# Patient Record
Sex: Female | Born: 1975 | Race: White | Hispanic: No | Marital: Single | Smoking: Never smoker
Health system: Southern US, Community
[De-identification: ages and names within clinical notes are randomized; demographics above are authoritative.]

## PROBLEM LIST (undated history)

## (undated) DIAGNOSIS — G43909 Migraine, unspecified, not intractable, without status migrainosus: Secondary | ICD-10-CM

## (undated) HISTORY — DX: Migraine, unspecified, not intractable, without status migrainosus: G43.909

## (undated) HISTORY — PX: INNER EAR SURGERY: SHX679

## (undated) HISTORY — PX: AUGMENTATION MAMMAPLASTY: SUR837

---
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
## 2003-07-06 HISTORY — PX: BREAST ENHANCEMENT SURGERY: SHX7

## 2009-02-14 ENCOUNTER — Emergency Department (HOSPITAL_COMMUNITY): Admission: EM | Admit: 2009-02-14 | Discharge: 2009-02-14 | Payer: Self-pay | Admitting: Family Medicine

## 2015-11-21 DIAGNOSIS — Z01419 Encounter for gynecological examination (general) (routine) without abnormal findings: Secondary | ICD-10-CM | POA: Diagnosis not present

## 2015-11-21 DIAGNOSIS — Z6823 Body mass index (BMI) 23.0-23.9, adult: Secondary | ICD-10-CM | POA: Diagnosis not present

## 2015-12-08 DIAGNOSIS — H5213 Myopia, bilateral: Secondary | ICD-10-CM | POA: Diagnosis not present

## 2016-02-09 DIAGNOSIS — G43009 Migraine without aura, not intractable, without status migrainosus: Secondary | ICD-10-CM | POA: Diagnosis not present

## 2016-02-11 DIAGNOSIS — R300 Dysuria: Secondary | ICD-10-CM | POA: Diagnosis not present

## 2016-02-11 DIAGNOSIS — N390 Urinary tract infection, site not specified: Secondary | ICD-10-CM | POA: Diagnosis not present

## 2016-02-27 DIAGNOSIS — Z3202 Encounter for pregnancy test, result negative: Secondary | ICD-10-CM | POA: Diagnosis not present

## 2016-02-27 DIAGNOSIS — Z30430 Encounter for insertion of intrauterine contraceptive device: Secondary | ICD-10-CM | POA: Diagnosis not present

## 2016-02-27 DIAGNOSIS — Z113 Encounter for screening for infections with a predominantly sexual mode of transmission: Secondary | ICD-10-CM | POA: Diagnosis not present

## 2016-03-10 DIAGNOSIS — H722X2 Other marginal perforations of tympanic membrane, left ear: Secondary | ICD-10-CM | POA: Diagnosis not present

## 2016-04-09 DIAGNOSIS — Z30431 Encounter for routine checking of intrauterine contraceptive device: Secondary | ICD-10-CM | POA: Diagnosis not present

## 2016-09-06 DIAGNOSIS — N938 Other specified abnormal uterine and vaginal bleeding: Secondary | ICD-10-CM | POA: Diagnosis not present

## 2016-09-06 DIAGNOSIS — Z30431 Encounter for routine checking of intrauterine contraceptive device: Secondary | ICD-10-CM | POA: Diagnosis not present

## 2016-10-08 DIAGNOSIS — M25552 Pain in left hip: Secondary | ICD-10-CM | POA: Diagnosis not present

## 2016-10-08 DIAGNOSIS — M9902 Segmental and somatic dysfunction of thoracic region: Secondary | ICD-10-CM | POA: Diagnosis not present

## 2016-10-08 DIAGNOSIS — M9904 Segmental and somatic dysfunction of sacral region: Secondary | ICD-10-CM | POA: Diagnosis not present

## 2016-10-08 DIAGNOSIS — M546 Pain in thoracic spine: Secondary | ICD-10-CM | POA: Diagnosis not present

## 2016-10-08 DIAGNOSIS — M9903 Segmental and somatic dysfunction of lumbar region: Secondary | ICD-10-CM | POA: Diagnosis not present

## 2016-10-08 DIAGNOSIS — M545 Low back pain: Secondary | ICD-10-CM | POA: Diagnosis not present

## 2016-10-08 DIAGNOSIS — M9905 Segmental and somatic dysfunction of pelvic region: Secondary | ICD-10-CM | POA: Diagnosis not present

## 2016-10-08 DIAGNOSIS — M25551 Pain in right hip: Secondary | ICD-10-CM | POA: Diagnosis not present

## 2016-12-14 DIAGNOSIS — H52223 Regular astigmatism, bilateral: Secondary | ICD-10-CM | POA: Diagnosis not present

## 2016-12-14 DIAGNOSIS — H5213 Myopia, bilateral: Secondary | ICD-10-CM | POA: Diagnosis not present

## 2017-04-08 DIAGNOSIS — Z01419 Encounter for gynecological examination (general) (routine) without abnormal findings: Secondary | ICD-10-CM | POA: Diagnosis not present

## 2017-04-08 DIAGNOSIS — Z6823 Body mass index (BMI) 23.0-23.9, adult: Secondary | ICD-10-CM | POA: Diagnosis not present

## 2017-04-25 DIAGNOSIS — H9072 Mixed conductive and sensorineural hearing loss, unilateral, left ear, with unrestricted hearing on the contralateral side: Secondary | ICD-10-CM | POA: Diagnosis not present

## 2017-04-25 DIAGNOSIS — H9012 Conductive hearing loss, unilateral, left ear, with unrestricted hearing on the contralateral side: Secondary | ICD-10-CM | POA: Diagnosis not present

## 2017-04-25 DIAGNOSIS — Z9889 Other specified postprocedural states: Secondary | ICD-10-CM | POA: Diagnosis not present

## 2017-05-13 DIAGNOSIS — Z30432 Encounter for removal of intrauterine contraceptive device: Secondary | ICD-10-CM | POA: Diagnosis not present

## 2017-05-13 DIAGNOSIS — Z3009 Encounter for other general counseling and advice on contraception: Secondary | ICD-10-CM | POA: Diagnosis not present

## 2017-05-13 DIAGNOSIS — N938 Other specified abnormal uterine and vaginal bleeding: Secondary | ICD-10-CM | POA: Diagnosis not present

## 2017-06-06 ENCOUNTER — Telehealth: Payer: Self-pay

## 2017-06-06 NOTE — Telephone Encounter (Signed)
returning call

## 2017-06-06 NOTE — Telephone Encounter (Signed)
Called patient back. No answer.

## 2017-06-06 NOTE — Telephone Encounter (Signed)
Pre- visit call made to patient. Left message for return cal.

## 2017-06-07 ENCOUNTER — Encounter: Payer: Self-pay | Admitting: Family

## 2017-06-07 ENCOUNTER — Ambulatory Visit (INDEPENDENT_AMBULATORY_CARE_PROVIDER_SITE_OTHER): Payer: 59 | Admitting: Family

## 2017-06-07 VITALS — BP 121/92 | HR 64 | Temp 98.4°F | Resp 16 | Ht 67.0 in | Wt 150.8 lb

## 2017-06-07 DIAGNOSIS — H9192 Unspecified hearing loss, left ear: Secondary | ICD-10-CM

## 2017-06-07 DIAGNOSIS — G43829 Menstrual migraine, not intractable, without status migrainosus: Secondary | ICD-10-CM

## 2017-06-07 DIAGNOSIS — G43909 Migraine, unspecified, not intractable, without status migrainosus: Secondary | ICD-10-CM | POA: Insufficient documentation

## 2017-06-07 DIAGNOSIS — H9190 Unspecified hearing loss, unspecified ear: Secondary | ICD-10-CM | POA: Insufficient documentation

## 2017-06-07 MED ORDER — NORETHIN-ETH ESTRAD-FE BIPHAS 1 MG-10 MCG / 10 MCG PO TABS: 1.0000 | ORAL_TABLET | Freq: Every day | ORAL | 11 refills | Status: DC

## 2017-06-07 MED ORDER — SUMATRIPTAN SUCCINATE 50 MG PO TABS: ORAL_TABLET | ORAL | 5 refills | Status: DC

## 2017-06-07 NOTE — Assessment & Plan Note (Signed)
Migraines are stable.  I suspect that her oral contraceptive is also going to help with her migraine symptoms.  Refill has been provided for Imitrex to use on an as-needed basis.

## 2017-06-07 NOTE — Patient Instructions (Signed)
Welcome to [facility]! 

## 2017-06-07 NOTE — Progress Notes (Signed)
Subjective:    Patient ID: Maria Hernandez, female    DOB: 08/30/1975, 41 y.o.   MRN: [id number]  HPI  Ms. Maria Hernandez is a 41 yr old female who presents today to establish care.   pmhx is significant for Migraines.  Reports that migraines began about 10 years ago.  Reports migraines once a month.  Typically occurs during/around period. Has associated nausea.  Usually does not have photophobia.  Excedrin migraine works most of the time. If this does not help imitrex helps. Recently started lo loestrin per her GYN.   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 [place]  Had repeat in 2015.  Saw ENT and  Was told that it needs to be re-done next month.    Review of Systems  Constitutional: Negative for unexpected weight change.  HENT: Negative for rhinorrhea.   Eyes: Negative for visual disturbance.  Respiratory: Negative for cough.   Cardiovascular: Negative for leg swelling.  Gastrointestinal: Negative for constipation and diarrhea.  Genitourinary: Negative for dysuria, frequency and menstrual problem.  Musculoskeletal: Negative for arthralgias and myalgias.  Hematological: Negative for adenopathy.  Psychiatric/Behavioral:       Denies depression/anxiety symptoms    Past Medical History:  Diagnosis Date  . Migraines      Social History   Socioeconomic History  . Marital status: Single    Spouse name: Not on file  . Number of children: Not on file  . Years of education: Not on file  . Highest education level: Not on file  Social Needs  . Financial resource strain: Not on file  . Food insecurity - worry: Not on file  . Food insecurity - inability: Not on file  . Transportation needs - medical: Not on file  . Transportation needs - non-medical: Not on file  Occupational History  . Not on file  Tobacco Use  . Smoking status: Never Smoker  . Smokeless tobacco: Never Used  Substance and Sexual  Activity  . Alcohol use: Yes    Frequency: Never    Comment: occasional wine "1- 2 per month"  . Drug use: Not on file  . Sexual activity: Not on file  Other Topics Concern  . Not on file  Social History Narrative  . Not on file    Past Surgical History:  Procedure Laterality Date  . BREAST ENHANCEMENT SURGERY Bilateral 2005  . INNER EAR SURGERY Left    Reports 2 prior ear surgeries for prosthesis. 3rd surgery coming up in January 2019  . MANDIBLE OSTEOTOMY  1995    Family History  Problem Relation Age of Onset  . Hypothyroidism Mother   . Hypertension Father   . Diabetes type II Father   . Breast cancer Maternal Grandmother     Allergies no known allergies  Current Outpatient Medications on File Prior to Visit  Medication Sig Dispense Refill  . ibuprofen (ADVIL,MOTRIN) 200 MG tablet Take 200 mg by mouth as needed.    . Multiple Vitamin (MULTIVITAMIN) tablet Take 1 tablet by mouth daily.    . SUMAtriptan (IMITREX) 50 MG tablet Take 1 tablet by mouth. Every 2 hours as needed for migraine     No current facility-administered medications on file prior to visit.     BP (!) 121/92 (BP Location: Right Arm, Cuff Size: Normal)   Pulse 64   Temp 98.4 F (36.9 C) (  Oral)   Resp 16   Ht 5\' 7"  (1.702 m)   Wt 150 lb 12.8 oz (68.4 kg)   LMP 06/07/2017   SpO2 100%   BMI 23.62 kg/m       Objective:   Physical Exam  Constitutional: She is oriented to person, place, and time. She appears well-developed and well-nourished.  HENT:  Head: Normocephalic and atraumatic.  Right Ear: Tympanic membrane and ear canal normal.  Left Ear: Ear canal normal.  + tm perforation.  Cardiovascular: Normal rate, regular rhythm and normal heart sounds.  No murmur heard. Pulmonary/Chest: Effort normal and breath sounds normal. No respiratory distress. She has no wheezes.  Musculoskeletal: She exhibits no edema.  Neurological: She is alert and oriented to person, place, and time.    Psychiatric: She has a normal mood and affect. Her behavior is normal. Judgment and thought content normal.          Assessment & Plan:

## 2017-06-07 NOTE — Assessment & Plan Note (Signed)
This is being managed by ENT and she has upcoming surgery planned.

## 2017-07-26 DIAGNOSIS — H9012 Conductive hearing loss, unilateral, left ear, with unrestricted hearing on the contralateral side: Secondary | ICD-10-CM | POA: Diagnosis not present

## 2017-08-02 DIAGNOSIS — H902 Conductive hearing loss, unspecified: Secondary | ICD-10-CM | POA: Diagnosis not present

## 2017-08-02 DIAGNOSIS — H7292 Unspecified perforation of tympanic membrane, left ear: Secondary | ICD-10-CM | POA: Diagnosis not present

## 2017-10-24 DIAGNOSIS — H9012 Conductive hearing loss, unilateral, left ear, with unrestricted hearing on the contralateral side: Secondary | ICD-10-CM | POA: Diagnosis not present

## 2017-11-10 DIAGNOSIS — H5212 Myopia, left eye: Secondary | ICD-10-CM | POA: Diagnosis not present

## 2017-11-10 DIAGNOSIS — H52223 Regular astigmatism, bilateral: Secondary | ICD-10-CM | POA: Diagnosis not present

## 2018-01-11 ENCOUNTER — Telehealth: Payer: Self-pay | Admitting: Family

## 2018-01-11 NOTE — Telephone Encounter (Signed)
1 month supply of imitrex sent to pharmacy. Pt is past due for cpe. Please call pt to schedule cpe with Melissa. Thanks!

## 2018-01-11 NOTE — Telephone Encounter (Signed)
Called pt and LVM advising pt to return call to schedule a CPE at her convenience.

## 2018-03-07 ENCOUNTER — Encounter: Payer: Self-pay | Admitting: Family

## 2018-03-07 ENCOUNTER — Ambulatory Visit: Payer: 59 | Admitting: Family

## 2018-03-07 VITALS — BP 122/88 | HR 70 | Temp 98.1°F | Resp 18 | Ht 67.0 in | Wt 155.8 lb

## 2018-03-07 DIAGNOSIS — G43809 Other migraine, not intractable, without status migrainosus: Secondary | ICD-10-CM | POA: Diagnosis not present

## 2018-03-07 DIAGNOSIS — N926 Irregular menstruation, unspecified: Secondary | ICD-10-CM | POA: Diagnosis not present

## 2018-03-07 DIAGNOSIS — Z1239 Encounter for other screening for malignant neoplasm of breast: Secondary | ICD-10-CM

## 2018-03-07 DIAGNOSIS — Z0000 Encounter for general adult medical examination without abnormal findings: Secondary | ICD-10-CM

## 2018-03-07 DIAGNOSIS — Z1231 Encounter for screening mammogram for malignant neoplasm of breast: Secondary | ICD-10-CM

## 2018-03-07 MED ORDER — NORGESTIMATE-ETH ESTRADIOL 0.25-35 MG-MCG PO TABS: 1.0000 | ORAL_TABLET | Freq: Every day | ORAL | 11 refills | Status: DC

## 2018-03-07 MED ORDER — SUMATRIPTAN SUCCINATE 50 MG PO TABS: ORAL_TABLET | ORAL | 5 refills | Status: DC

## 2018-03-07 NOTE — Progress Notes (Signed)
Subjective:    Patient ID: Maria Hernandez, female    DOB: [birth year], 42 y.o.   MRN: [id number]  HPI  Reports migraines are intermittent. Occur periodically. Not necessarily related to her menstrual cycle. Reports that she has been spotting regularly.  She reports that she has migraines 2-3 times a month.  Uses 1/2 tab of imitrex and this relieves her headache. Pleased with this regimen and wishes to continue.  Irregular menses-she reports that she has wall 7 to 14-day cycles with intermittent spotting despite use of oral contraceptive.  Her oral contraceptive has been adjusted several times by her gynecologist whom she continues to follow with. Review of Systems   See HPI  Past Medical History:  Diagnosis Date  . Migraines      Social History   Socioeconomic History  . Marital status: Single    Spouse name: Not on file  . Number of children: Not on file  . Years of education: Not on file  . Highest education level: Not on file  Occupational History  . Not on file  Social Needs  . Financial resource strain: Not on file  . Food insecurity:    Worry: Not on file    Inability: Not on file  . Transportation needs:    Medical: Not on file    Non-medical: Not on file  Tobacco Use  . Smoking status: Never Smoker  . Smokeless tobacco: Never Used  Substance and Sexual Activity  . Alcohol use: Yes    Frequency: Never    Comment: occasional wine "1- 2 per month"  . Drug use: Not on file  . Sexual activity: Not on file  Lifestyle  . Physical activity:    Days per week: Not on file    Minutes per session: Not on file  . Stress: Not on file  Relationships  . Social connections:    Talks on phone: Not on file    Gets together: Not on file    Attends religious service: Not on file    Active member of club or organization: Not on file    Attends meetings of clubs or organizations: Not on file    Relationship status: Not on file  . Intimate partner violence:    Fear of  current or ex partner: Not on file    Emotionally abused: Not on file    Physically abused: Not on file    Forced sexual activity: Not on file  Other Topics Concern  . 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 BREAST ENHANCEMENT SURGERY Bilateral 2005  . INNER EAR SURGERY Left    Reports 2 prior ear surgeries for prosthesis. 3rd surgery coming up in January 2019  . MANDIBLE OSTEOTOMY  1995    Family History  Problem Relation Age of Onset  . Hypothyroidism Mother   . Hypertension Father   . Diabetes type II Father   . Breast cancer Maternal Grandmother     No Known Allergies  Current Outpatient Medications on File Prior to Visit  Medication Sig Dispense Refill  . ibuprofen (ADVIL,MOTRIN) 200 MG tablet Take 200 mg by mouth as needed.    Marland Kitchen  Multiple Vitamin (MULTIVITAMIN) tablet Take 1 tablet by mouth daily.     No current facility-administered medications on file prior to visit.     BP 122/88 (BP Location: Right Arm, Cuff Size: Normal)   Pulse 70   Temp 98.1 F (36.7 C) (Oral)   Resp 18   Ht 5\' 7"  (1.702 m)   Wt 155 lb 12.8 oz (70.7 kg)   LMP 02/07/2018   SpO2 100%   BMI 24.40 kg/m       Objective:   Physical Exam  Constitutional: She is oriented to person, place, and time. She appears well-developed and well-nourished.  Eyes: Pupils are equal, round, and reactive to light. EOM and lids are normal. Right eye exhibits no exudate. Left eye exhibits no exudate.  Cardiovascular: Normal rate, regular rhythm and normal heart sounds.  No murmur heard. Pulmonary/Chest: Effort normal and breath sounds normal. No respiratory distress. She has no wheezes.  Neurological: She is alert and oriented to person, place, and time. She exhibits  normal muscle tone. Coordination normal.  Skin: Skin is warm and dry.  Psychiatric: She has a normal mood and affect. Her behavior is normal. Judgment and thought content normal.          Assessment & Plan:  Migraine- she is satisfied with the current as needed dosing regimen of Imitrex.  We will continue same.  Irregular menses-she continues to follow with her gynecologist and is considering discontinuation of oral contraceptive due to spotting and hair loss.  She attributes the hair loss to the oral contraceptive and reports that she has seen dermatologist in the past we have done scalp biopsies and told her that this is the cause of her hair loss.

## 2018-03-07 NOTE — Addendum Note (Signed)
Addended by: Mervin Kung A on: 03/07/2018 01:31 PM   Modules accepted: Orders

## 2018-03-08 ENCOUNTER — Telehealth: Payer: Self-pay | Admitting: Family

## 2018-03-08 MED ORDER — SILVER SULFADIAZINE 1 % EX CREA: 1.0000 "application " | TOPICAL_CREAM | Freq: Every day | CUTANEOUS | 0 refills | Status: DC

## 2018-03-08 NOTE — Telephone Encounter (Signed)
Notified pt. She has been using benadryl sprays, aqua phor ointment; Has used hydrocortisone cream, aloe gels. Has applied cold compresses at night. Has also used coconut oil; all of these over the last 5 days and reports that sunburn looks much worse today. No blistering but "almost looks purple and looks like sun poisoning". Pt is requesting silvadene cream. Please advise?

## 2018-03-08 NOTE — Telephone Encounter (Signed)
Recommend ibuprofen as needed for pain. Best topical options include:  Cool compresses or soaks, calamine lotion, or aloe vera-based gels.

## 2018-03-08 NOTE — Telephone Encounter (Signed)
Copied from CRM [id number]. Topic: Quick Communication - See Telephone Encounter >> Mar 08, 2018  9:08 AM Lorayne Bender wrote: CRM for notification. See Telephone encounter for: 03/08/18.  Pt states she was seen by physician yesterday and has a severe sunburn.  Pt wants to know if there is any sort of cream that can be prescribed that would help.  Pt states that the worst burns are on her shins. Pt uses [facility] [address] [place], [place] - [address] [phone] (Phone) [phone] (Fax) Pt can be reached at [phone]

## 2018-03-08 NOTE — Telephone Encounter (Signed)
Left detailed message on voicemail and to call if any questions. 

## 2018-03-08 NOTE — Telephone Encounter (Signed)
rx sent. Schedule OV if symptoms worsen or fail to improve.

## 2018-03-08 NOTE — Telephone Encounter (Addendum)
Pt is calling back and would like silvadene. Pt does not have any open wounds

## 2018-03-27 ENCOUNTER — Encounter: Payer: Self-pay | Admitting: *Deleted

## 2018-04-10 ENCOUNTER — Encounter: Payer: Self-pay | Admitting: Family

## 2018-04-10 ENCOUNTER — Ambulatory Visit (HOSPITAL_BASED_OUTPATIENT_CLINIC_OR_DEPARTMENT_OTHER)
Admission: RE | Admit: 2018-04-10 | Discharge: 2018-04-10 | Disposition: A | Discharge status: Home / Self Care | Payer: 59 | Source: Ambulatory Visit | Attending: Family | Admitting: Family

## 2018-04-10 ENCOUNTER — Ambulatory Visit (INDEPENDENT_AMBULATORY_CARE_PROVIDER_SITE_OTHER): Payer: 59 | Admitting: Family

## 2018-04-10 ENCOUNTER — Encounter (HOSPITAL_BASED_OUTPATIENT_CLINIC_OR_DEPARTMENT_OTHER): Payer: Self-pay

## 2018-04-10 VITALS — BP 110/76 | HR 56 | Temp 99.8°F | Resp 16 | Ht 66.5 in | Wt 152.0 lb

## 2018-04-10 DIAGNOSIS — Z0000 Encounter for general adult medical examination without abnormal findings: Secondary | ICD-10-CM

## 2018-04-10 DIAGNOSIS — Z23 Encounter for immunization: Secondary | ICD-10-CM | POA: Diagnosis not present

## 2018-04-10 DIAGNOSIS — Z1239 Encounter for other screening for malignant neoplasm of breast: Secondary | ICD-10-CM | POA: Insufficient documentation

## 2018-04-10 DIAGNOSIS — Z1231 Encounter for screening mammogram for malignant neoplasm of breast: Secondary | ICD-10-CM | POA: Diagnosis not present

## 2018-04-10 LAB — BASIC METABOLIC PANEL
BUN: 11 mg/dL (ref 6–23)
CO2: 26 meq/L (ref 19–32)
CREATININE: 0.73 mg/dL (ref 0.40–1.20)
Calcium: 9.4 mg/dL (ref 8.4–10.5)
Chloride: 104 mEq/L (ref 96–112)
GFR: 93.73 mL/min (ref >60.00)
Glucose, Bld: 77 mg/dL (ref 70–99)
Potassium: 4.3 mEq/L (ref 3.5–5.1)
Sodium: 138 mEq/L (ref 135–145)

## 2018-04-10 LAB — URINALYSIS, ROUTINE W REFLEX MICROSCOPIC
Bilirubin Urine: NEGATIVE
KETONES UR: NEGATIVE
Nitrite: NEGATIVE
Specific Gravity, Urine: >=1.03 — AB (ref 1.000–1.030)
Total Protein, Urine: NEGATIVE
URINE GLUCOSE: NEGATIVE
Urobilinogen, UA: 0.2 (ref 0.0–1.0)
pH: 5.5 (ref 5.0–8.0)

## 2018-04-10 LAB — CBC WITH DIFFERENTIAL/PLATELET
Basophils Absolute: 0 10*3/uL (ref 0.0–0.1)
Basophils Relative: 0.6 % (ref 0.0–3.0)
Eosinophils Absolute: 0.1 10*3/uL (ref 0.0–0.7)
Eosinophils Relative: 1 % (ref 0.0–5.0)
HEMATOCRIT: 39.3 % (ref 36.0–46.0)
Hemoglobin: 13 g/dL (ref 12.0–15.0)
LYMPHS PCT: 27.1 % (ref 12.0–46.0)
Lymphs Abs: 1.8 10*3/uL (ref 0.7–4.0)
MCHC: 33.2 g/dL (ref 30.0–36.0)
MCV: 91.3 fl (ref 78.0–100.0)
MONOS PCT: 7.6 % (ref 3.0–12.0)
Monocytes Absolute: 0.5 10*3/uL (ref 0.1–1.0)
NEUTROS ABS: 4.2 10*3/uL (ref 1.4–7.7)
Neutrophils Relative %: 63.7 % (ref 43.0–77.0)
PLATELETS: 299 10*3/uL (ref 150.0–400.0)
RBC: 4.3 Mil/uL (ref 3.87–5.11)
RDW: 12.8 % (ref 11.5–15.5)
WBC: 6.5 10*3/uL (ref 4.0–10.5)

## 2018-04-10 LAB — HEPATIC FUNCTION PANEL
ALT: 12 U/L (ref 0–35)
AST: 15 U/L (ref 0–37)
Albumin: 4.1 g/dL (ref 3.5–5.2)
Alkaline Phosphatase: 50 U/L (ref 39–117)
BILIRUBIN TOTAL: 0.4 mg/dL (ref 0.2–1.2)
Bilirubin, Direct: 0.1 mg/dL (ref 0.0–0.3)
TOTAL PROTEIN: 7.2 g/dL (ref 6.0–8.3)

## 2018-04-10 LAB — LIPID PANEL
CHOLESTEROL: 177 mg/dL (ref 0–200)
HDL: 58.2 mg/dL (ref >39.00)
LDL Cholesterol: 99 mg/dL (ref 0–99)
NonHDL: 119.24
TRIGLYCERIDES: 100 mg/dL (ref 0.0–149.0)
Total CHOL/HDL Ratio: 3
VLDL: 20 mg/dL (ref 0.0–40.0)

## 2018-04-10 LAB — TSH: TSH: 0.53 u[IU]/mL (ref 0.35–4.50)

## 2018-04-10 NOTE — Progress Notes (Signed)
Subjective:    Patient ID: Maria Hernandez, female    DOB: April 04, 1976, 42 y.o.   MRN: [id number]  HPI  Patient presents today for complete physical.  Immunizations: flu shot today. Unsure of last tetanus, would like to do today Diet:  Reports healthy diet Exercise: walks and exercises regularly Pap Smear: 2 years ago- will do with Dr. Marice Potter GYN Mammogram: completed today Reports that she is having more migraines and weight gain on OCP, she has d/c'd her ocp and plans to use condoms instead.   Wt Readings from Last 3 Encounters:  04/10/18 152 lb (68.9 kg)  03/07/18 155 lb 12.8 oz (70.7 kg)  06/07/17 150 lb 12.8 oz (68.4 kg)     Review of Systems  Constitutional: Negative for unexpected weight change.  HENT: Negative for rhinorrhea.        Sees ENT for left ear issues- has some decreased hearing.   Eyes: Negative for visual disturbance.  Respiratory: Negative for cough.   Genitourinary: Negative for dysuria, frequency and hematuria.  Musculoskeletal: Negative for arthralgias.  Skin: Negative for rash.  Neurological: Positive for headaches.  Hematological: Negative for adenopathy.  Psychiatric/Behavioral:       Denies depression/anxiety   Past Medical History:  Diagnosis Date  . Migraines      Social History   Socioeconomic History  . Marital status: Single    Spouse name: Not on file  . Number of children: Not on file  . Years of education: Not on file  . Highest education level: Not on file  Occupational History  . Not on file  Social Needs  . Financial resource strain: Not on file  . Food insecurity:    Worry: Not on file    Inability: Not on file  . Transportation needs:    Medical: Not on file    Non-medical: Not on file  Tobacco Use  . Smoking status: Never Smoker  . Smokeless tobacco: Never Used  Substance and Sexual Activity  . Alcohol use: Yes    Frequency: Never    Comment: occasional wine "1- 2 per month"  . Drug use: Not on file  . Sexual  activity: Not on file  Lifestyle  . Physical activity:    Days per week: Not on file    Minutes per session: Not on file  . Stress: Not on file  Relationships  . Social connections:    Talks on phone: Not on file    Gets together: Not on file    Attends religious service: Not on file    Active member of club or organization: Not on file    Attends meetings of clubs or organizations: Not on file    Relationship status: Not on file  . Intimate partner violence:    Fear of current or ex partner: Not on file    Emotionally abused: Not on file    Physically abused: Not on file    Forced sexual activity: Not on file  Other Topics Concern  . 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 AUGMENTATION MAMMAPLASTY    . BREAST ENHANCEMENT SURGERY Bilateral 2005  . INNER  EAR SURGERY Left    Reports 2 prior ear surgeries for prosthesis. 3rd surgery coming up in January 2019  . MANDIBLE OSTEOTOMY  1995    Family History  Problem Relation Age of Onset  . Hypothyroidism Mother   . Hypertension Father   . Diabetes type II Father   . Breast cancer Maternal Grandmother     No Known Allergies  Current Outpatient Medications on File Prior to Visit  Medication Sig Dispense Refill  . ibuprofen (ADVIL,MOTRIN) 200 MG tablet Take 200 mg by mouth as needed.    . Multiple Vitamin (MULTIVITAMIN) tablet Take 1 tablet by mouth daily.    . SUMAtriptan (IMITREX) 50 MG tablet TAKE 1 TABLET BY MOUTH BY MOUTH AS NEEDED FOR MIGRAINE HEADACHE. MAY REPEAT IN 2 HOURS AND TAKE 1 TABLET IF NEEDED. 10 tablet 5   No current facility-administered medications on file prior to visit.     BP 110/76 (BP Location: Right Arm, Patient Position: Sitting, Cuff Size: Small)   Pulse (!) 56    Temp 99.8 F (37.7 C) (Oral)   Resp 16   Ht 5' 6.5" (1.689 m)   Wt 152 lb (68.9 kg)   LMP 03/13/2018   SpO2 99%   BMI 24.17 kg/m       Objective:   Physical Exam  Physical Exam  Constitutional: She is oriented to person, place, and time. She appears well-developed and well-nourished. No distress.  HENT:  Head: Normocephalic and atraumatic.  Right Ear: Tympanic membrane and ear canal normal.  Left Ear: Tympanic membrane reconstruction- appears intact, flesh colored membrane, no erythema Mouth/Throat: Oropharynx is clear and moist.  Eyes: Pupils are equal, round, and reactive to light. No scleral icterus.  Neck: Normal range of motion. No thyromegaly present.  Cardiovascular: Normal rate and regular rhythm.   No murmur heard. Pulmonary/Chest: Effort normal and breath sounds normal. No respiratory distress. He has no wheezes. She has no rales. She exhibits no tenderness.  Abdominal: Soft. Bowel sounds are normal. She exhibits no distension and no mass. There is no tenderness. There is no rebound and no guarding.  Musculoskeletal: She exhibits no edema.  Lymphadenopathy:    She has no cervical adenopathy.  Neurological: She is alert and oriented to person, place, and time. She has normal patellar reflexes. She exhibits normal muscle tone. Coordination normal.  Skin: Skin is warm and dry.  Psychiatric: She has a normal mood and affect. Her behavior is normal. Judgment and thought content normal.  Breasts: Examined lying (bilateral breast implants noted.  Right: Without masses, retractions, discharge or axillary adenopathy.  Left: Without masses, retractions, discharge or axillary adenopathy.  Pelvic: defered           Assessment & Plan:   Preventative care- continue healthy diet, exercise, obtain routine lab work.Mammogram completed today. She will complete pap with GYN.  Tdap today. Flu shot up to date.  EKG tracing is personally reviewed.  EKG notes NSR (Sinus brady).   No acute changes.        Assessment & Plan:

## 2018-04-10 NOTE — Patient Instructions (Addendum)
Continue healthy diet and regular exercise.  Complete lab work prior to leaving.  

## 2018-04-11 MED ORDER — NITROFURANTOIN MONOHYD MACRO 100 MG PO CAPS: 100.0000 mg | ORAL_CAPSULE | Freq: Two times a day (BID) | ORAL | 0 refills | Status: DC

## 2018-04-12 MED ORDER — FLUCONAZOLE 150 MG PO TABS: 150.0000 mg | ORAL_TABLET | Freq: Once | ORAL | 0 refills | Status: DC

## 2018-04-12 MED ORDER — CEPHALEXIN 500 MG PO CAPS: 500.0000 mg | ORAL_CAPSULE | Freq: Two times a day (BID) | ORAL | 0 refills | Status: DC

## 2018-04-12 NOTE — Addendum Note (Signed)
Addended by: Sandford Craze on: 04/12/2018 01:35 PM   Modules accepted: Orders

## 2018-04-12 NOTE — Addendum Note (Signed)
Addended by: Sandford Craze on: 04/12/2018 07:12 AM   Modules accepted: Orders

## 2018-04-18 ENCOUNTER — Encounter: Payer: Self-pay | Admitting: Family

## 2018-04-18 MED ORDER — FLUCONAZOLE 150 MG PO TABS: 150.0000 mg | ORAL_TABLET | Freq: Once | ORAL | 0 refills | Status: AC

## 2018-04-18 NOTE — Addendum Note (Signed)
Addended by: Sandford Craze on: 04/18/2018 12:08 PM   Modules accepted: Orders

## 2018-05-01 ENCOUNTER — Encounter: Payer: Self-pay | Admitting: Family

## 2018-05-02 ENCOUNTER — Encounter: Payer: Self-pay | Admitting: Family

## 2018-05-04 ENCOUNTER — Ambulatory Visit (INDEPENDENT_AMBULATORY_CARE_PROVIDER_SITE_OTHER): Payer: 59 | Admitting: Obstetrics & Gynecology

## 2018-05-04 ENCOUNTER — Encounter: Payer: Self-pay | Admitting: Obstetrics & Gynecology

## 2018-05-04 VITALS — BP 135/85 | HR 62 | Ht 67.5 in | Wt 153.3 lb

## 2018-05-04 DIAGNOSIS — B9689 Other specified bacterial agents as the cause of diseases classified elsewhere: Secondary | ICD-10-CM | POA: Diagnosis not present

## 2018-05-04 DIAGNOSIS — N760 Acute vaginitis: Secondary | ICD-10-CM

## 2018-05-04 DIAGNOSIS — Z01419 Encounter for gynecological examination (general) (routine) without abnormal findings: Secondary | ICD-10-CM

## 2018-05-04 DIAGNOSIS — N898 Other specified noninflammatory disorders of vagina: Secondary | ICD-10-CM | POA: Diagnosis not present

## 2018-05-04 DIAGNOSIS — Z1151 Encounter for screening for human papillomavirus (HPV): Secondary | ICD-10-CM | POA: Diagnosis not present

## 2018-05-04 DIAGNOSIS — Z124 Encounter for screening for malignant neoplasm of cervix: Secondary | ICD-10-CM | POA: Diagnosis not present

## 2018-05-04 NOTE — Progress Notes (Signed)
Subjective:    Maria Hernandez is a 42 y.o.single P0 female who presents for an annual exam. The patient has no complaints today. The patient is not currently sexually active. GYN screening history: last pap: was normal. The patient wears seatbelts: yes. The patient participates in regular exercise: yes. (walker and weight lifting) Has the patient ever been transfused or tattooed?: yes. The patient reports that there is not domestic violence in her life.   Menstrual History: OB History   None     Menarche age: 50 Patient's last menstrual period was 04/06/2018 (within days). Period Duration (Days): 9 Period Pattern: (!) Irregular Menstrual Flow: Light, Moderate, Heavy Menstrual Control: Tampon Menstrual Control Change Freq (Hours): 2-3 Dysmenorrhea: None  The following portions of the patient's history were reviewed and updated as appropriate: allergies, current medications, past family history, past medical history, past social history, past surgical history and problem list.  Review of Systems Pertinent items are noted in HPI.   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   Objective:    BP 135/85   Pulse 62   Ht 5' 7.5" (1.715 m)   Wt 153 lb 4.8 oz (69.5 kg)   LMP 04/06/2018 (Within Days)   BMI 23.66 kg/m   General Appearance:    Alert, cooperative, no distress, appears stated age  Head:    Normocephalic, without obvious abnormality, atraumatic  Eyes:    PERRL, conjunctiva/corneas clear, EOM's intact, fundi    benign, both eyes  Ears:    Normal TM's and external ear canals, both ears  Nose:   Nares normal, septum midline, mucosa normal, no drainage    or sinus tenderness  Throat:   Lips, mucosa, and tongue normal; teeth and gums normal  Neck:   Supple, symmetrical, trachea midline, no adenopathy;    thyroid:   no enlargement/tenderness/nodules; no carotid   bruit or JVD  Back:     Symmetric, no curvature, ROM normal, no CVA tenderness  Lungs:     Clear to auscultation bilaterally, respirations unlabored  Chest Wall:    No tenderness or deformity   Heart:    Regular rate and rhythm, S1 and S2 normal, no murmur, rub   or gallop  Breast Exam:    No tenderness, masses, or nipple abnormality  Abdomen:     Soft, non-tender, bowel sounds active all four quadrants,    no masses, no organomegaly  Genitalia:    Normal female without lesion, discharge or tenderness, normal size and shape, retroverted, mobile, non-tender, normal adnexal exam      Extremities:   Extremities normal, atraumatic, no cyanosis or edema  Pulses:   2+ and symmetric all extremities  Skin:   Skin color, texture, turgor normal, no rashes or lesions  Lymph nodes:   Cervical, supraclavicular, and axillary nodes normal  Neurologic:   CNII-XII intact, normal strength, sensation and reflexes    throughout  .    Assessment:    Healthy female exam.    Plan:     Thin prep Pap smear. with cotesting Declines Gardasil

## 2018-05-08 LAB — CYTOLOGY - PAP
BACTERIAL VAGINITIS: POSITIVE — AB
Candida vaginitis: NEGATIVE
DIAGNOSIS: NEGATIVE
HPV: NOT DETECTED

## 2018-05-12 ENCOUNTER — Encounter: Payer: Self-pay | Admitting: Family

## 2018-05-13 ENCOUNTER — Encounter: Payer: Self-pay | Admitting: Family

## 2018-05-13 ENCOUNTER — Telehealth: Payer: Self-pay | Admitting: Family

## 2018-05-13 MED ORDER — METRONIDAZOLE 500 MG PO TABS: 500.0000 mg | ORAL_TABLET | Freq: Two times a day (BID) | ORAL | 0 refills | Status: DC

## 2018-05-13 NOTE — Telephone Encounter (Addendum)
[place] on call  BV noted on pap smear from 05/04/18- I'm not 100% clear what was done with these results/information as I do not see a phone call/mychart message/note on lab result from gynecology office .   See mychart messages for patient symptoms. Will send in flagyl (had period 10 days ago and was regular and states not sexually active since coming off birth control 3 months ago).   She asks about [place] sending in diflucan- I told her would defer this to Dr. Marice Potter or her PCP Sandford Craze.   She should seek care if symptoms fail to improve.   Tana Conch

## 2018-05-13 NOTE — Telephone Encounter (Signed)
Patient is needing medication for a condition that she was diagnosed with by Nicholaus Bloom. The patient has sent messages through MyChart to both [facility] and Sandford Craze and they have not responded to her. She needs medication called in today for this condition. She states that she can't go through the weekend with medication. She is feeling very uncomfortable.  Please Advise

## 2018-05-13 NOTE — Addendum Note (Signed)
Addended by: Shelva Majestic on: 05/13/2018 12:58 PM   Modules accepted: Orders

## 2018-05-15 ENCOUNTER — Other Ambulatory Visit: Payer: Self-pay | Admitting: *Deleted

## 2018-05-15 ENCOUNTER — Encounter: Payer: Self-pay | Admitting: *Deleted

## 2018-05-15 DIAGNOSIS — B373 Candidiasis of vulva and vagina: Secondary | ICD-10-CM

## 2018-05-15 DIAGNOSIS — B3731 Acute candidiasis of vulva and vagina: Secondary | ICD-10-CM

## 2018-05-15 MED ORDER — FLUCONAZOLE 150 MG PO TABS: 150.0000 mg | ORAL_TABLET | Freq: Once | ORAL | 0 refills | Status: DC

## 2018-05-15 NOTE — Addendum Note (Signed)
Addended by: Gerome Apley on: 05/15/2018 08:54 AM   Modules accepted: Orders

## 2018-05-15 NOTE — Telephone Encounter (Signed)
Sent MyChart request for diflucan for yeast which I sent it. Then sent message would like 2 because usually takes two. I informed her per protocol I cant send in 2 without your ok.so I am fowarding order to you for approval.

## 2018-05-15 NOTE — Progress Notes (Addendum)
Orders per MyChart requests.

## 2018-05-18 ENCOUNTER — Encounter: Payer: Self-pay | Admitting: Family

## 2018-05-18 DIAGNOSIS — N3289 Other specified disorders of bladder: Secondary | ICD-10-CM

## 2018-05-19 ENCOUNTER — Encounter: Payer: Self-pay | Admitting: Family

## 2018-05-19 ENCOUNTER — Other Ambulatory Visit: Payer: 59

## 2018-05-19 DIAGNOSIS — N3289 Other specified disorders of bladder: Secondary | ICD-10-CM | POA: Diagnosis not present

## 2018-05-19 MED ORDER — FLUCONAZOLE 150 MG PO TABS: 150.0000 mg | ORAL_TABLET | Freq: Once | ORAL | 0 refills | Status: AC

## 2018-05-19 NOTE — Telephone Encounter (Signed)
Please contact the patient and ask her to swing by our office this afternoon to provide a urine sample. Alternatively, she can go to [facility] but we would need to change the orders if she goes there. I will send her a mychart message later this weekend when I have more information and with further recommendations.

## 2018-05-19 NOTE — Telephone Encounter (Signed)
Patient states she is going to come by right now to leave a urine sample-she lives close. Advised her that we can dip it here but the culture would take until the end of the weekend or possibly Monday before the results came back. She also states she has been taking flagyl for BV and will finish her last dose today.

## 2018-05-19 NOTE — Telephone Encounter (Signed)
Discussed patient request with Dr. Macon LargeAnyanwu as Dr.Dove not available- approved refill for 2nd diflucan. Will send mychart message to pt.  Nancy FetterLinda Zeyfang,RN

## 2018-05-20 LAB — URINALYSIS, ROUTINE W REFLEX MICROSCOPIC
Bacteria, UA: NONE SEEN /HPF
Bilirubin Urine: NEGATIVE
Glucose, UA: NEGATIVE
HYALINE CAST: NONE SEEN /LPF
Hgb urine dipstick: NEGATIVE
Ketones, ur: NEGATIVE
Nitrite: NEGATIVE
PROTEIN: NEGATIVE
Specific Gravity, Urine: 1.022 (ref 1.001–1.03)
pH: <=5 (ref 5.0–8.0)

## 2018-05-20 LAB — URINE CULTURE
MICRO NUMBER:: 91378864
SPECIMEN QUALITY:: ADEQUATE

## 2018-05-21 ENCOUNTER — Encounter: Payer: Self-pay | Admitting: Family

## 2018-05-23 NOTE — Telephone Encounter (Signed)
See additional mychart messages from pt.

## 2018-05-26 ENCOUNTER — Encounter: Payer: Self-pay | Admitting: Family

## 2018-05-26 ENCOUNTER — Other Ambulatory Visit (HOSPITAL_COMMUNITY)
Admission: RE | Admit: 2018-05-26 | Discharge: 2018-05-26 | Disposition: A | Discharge status: Home / Self Care | Payer: 59 | Source: Ambulatory Visit | Attending: Family | Admitting: Family

## 2018-05-26 ENCOUNTER — Ambulatory Visit: Payer: 59 | Admitting: Family

## 2018-05-26 VITALS — BP 131/95 | HR 72 | Temp 98.8°F | Resp 18 | Ht 67.0 in | Wt 153.0 lb

## 2018-05-26 DIAGNOSIS — N760 Acute vaginitis: Secondary | ICD-10-CM

## 2018-05-26 NOTE — Progress Notes (Signed)
Subjective:    Patient ID: Maria Hernandez, female    DOB: 09/23/1975, 42 y.o.   MRN: [id number]  HPI  Patient is a 42 yr old female who presents today requesting swab for yeast/bacteria. Reports that she has no burning/itching or discharge.    Review of Systems See HPI  Past Medical History:  Diagnosis Date  . Migraines      Social History   Socioeconomic History  . Marital status: Single    Spouse name: Not on file  . Number of children: Not on file  . Years of education: Not on file  . Highest education level: Not on file  Occupational History  . Not on file  Social Needs  . Financial resource strain: Not on file  . Food insecurity:    Worry: Not on file    Inability: Not on file  . Transportation needs:    Medical: Not on file    Non-medical: Not on file  Tobacco Use  . Smoking status: Never Smoker  . Smokeless tobacco: Never Used  Substance and Sexual Activity  . Alcohol use: Yes    Frequency: Never    Comment: occasional wine "1- 2 per month"  . Drug use: Never  . Sexual activity: Not Currently    Birth control/protection: None  Lifestyle  . Physical activity:    Days per week: Not on file    Minutes per session: Not on file  . Stress: Not on file  Relationships  . Social connections:    Talks on phone: Not on file    Gets together: Not on file    Attends religious service: Not on file    Active member of club or organization: Not on file    Attends meetings of clubs or organizations: Not on file    Relationship status: Not on file  . Intimate partner violence:    Fear of current or ex partner: Not on file    Emotionally abused: Not on file    Physically abused: Not on file    Forced sexual activity: Not on file  Other Topics Concern  . 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 AUGMENTATION MAMMAPLASTY    . BREAST ENHANCEMENT SURGERY Bilateral 2005  . INNER EAR SURGERY Left    Reports 2 prior ear surgeries for prosthesis. 3rd surgery coming up in January 2019  . MANDIBLE OSTEOTOMY  1995    Family History  Problem Relation Age of Onset  . Hypothyroidism Mother   . Hypertension Father   . Diabetes type II Father   . Breast cancer Maternal Grandmother     Allergies  Allergen Reactions  . Verapamil Hives  . Macrobid [Nitrofurantoin Macrocrystal]     Light headed    Current Outpatient Medications on File Prior to Visit  Medication Sig Dispense Refill  . ibuprofen (ADVIL,MOTRIN) 200 MG tablet Take 200 mg by mouth as needed.    . metroNIDAZOLE (FLAGYL) 500 MG tablet Take 1 tablet (500 mg total) by mouth 2 (two) times daily. Do not use alcohol while taking this medication. 14 tablet 0  . Multiple Vitamin (MULTIVITAMIN) tablet Take  1 tablet by mouth daily.    . SUMAtriptan (IMITREX) 50 MG tablet TAKE 1 TABLET BY MOUTH BY MOUTH AS NEEDED FOR MIGRAINE HEADACHE. MAY REPEAT IN 2 HOURS AND TAKE 1 TABLET IF NEEDED. 10 tablet 5   No current facility-administered medications on file prior to visit.     BP (!) 131/95 (BP Location: Right Arm, Cuff Size: Normal)   Pulse 72   Temp 98.8 F (37.1 C) (Oral)   Resp 18   Ht 5\' 7"  (1.702 m)   Wt 153 lb (69.4 kg)   LMP 05/04/2018   SpO2 100%   BMI 23.96 kg/m       Objective:   Physical Exam  Constitutional: She appears well-developed and well-nourished.  Cardiovascular: Normal rate, regular rhythm and normal heart sounds.  No murmur heard. Pulmonary/Chest: Effort normal and breath sounds normal. No respiratory distress. She has no wheezes.  Genitourinary: Vagina normal. No vaginal discharge found.  Psychiatric: She has a normal mood and affect. Her behavior is normal. Judgment and thought content normal.          Assessment &  Plan:  Vaginitis- symptoms resolved. Request swab to confirm. Will send for BV/Yeast.

## 2018-05-26 NOTE — Addendum Note (Signed)
Addended by: Mervin KungFERGERSON, Eliyah Mcshea A on: 05/26/2018 10:35 AM   Modules accepted: Orders

## 2018-05-26 NOTE — Patient Instructions (Signed)
We will contact you with your results via mychart.

## 2018-05-29 ENCOUNTER — Encounter: Payer: Self-pay | Admitting: Family

## 2018-05-29 ENCOUNTER — Telehealth: Payer: Self-pay | Admitting: Family

## 2018-05-29 LAB — CERVICOVAGINAL ANCILLARY ONLY
Bacterial vaginitis: NEGATIVE
CANDIDA VAGINITIS: NEGATIVE

## 2018-05-29 NOTE — Telephone Encounter (Signed)
Copied from CRM [id number]. Topic: Quick Communication - See Telephone Encounter >> May 29, 2018 10:40 AM Arlyss Gandyichardson, Priyansh Pry N, NT wrote: CRM for notification. See Telephone encounter for: 05/29/18. Pt calling to receive lab results.

## 2018-05-29 NOTE — Telephone Encounter (Signed)
Could you please check status of wet prep results?

## 2018-05-29 NOTE — Telephone Encounter (Signed)
See patient's email from today.//AB/CMA

## 2018-05-29 NOTE — Telephone Encounter (Signed)
Results are now available. Please advise.  

## 2018-06-05 DIAGNOSIS — H9012 Conductive hearing loss, unilateral, left ear, with unrestricted hearing on the contralateral side: Secondary | ICD-10-CM | POA: Diagnosis not present

## 2018-06-06 ENCOUNTER — Telehealth: Payer: Self-pay | Admitting: Family

## 2018-06-06 NOTE — Telephone Encounter (Signed)
See mychart.  

## 2018-07-03 ENCOUNTER — Ambulatory Visit: Payer: 59 | Admitting: Family

## 2018-07-03 ENCOUNTER — Encounter

## 2018-07-03 VITALS — BP 116/69 | HR 76 | Temp 98.8°F | Resp 16 | Ht 67.0 in | Wt 157.0 lb

## 2018-07-03 DIAGNOSIS — M2550 Pain in unspecified joint: Secondary | ICD-10-CM

## 2018-07-03 MED ORDER — MELOXICAM 7.5 MG PO TABS: 7.5000 mg | ORAL_TABLET | Freq: Every day | ORAL | 0 refills | Status: DC

## 2018-07-03 NOTE — Patient Instructions (Signed)
Stop naproxen, start meloxicam. You will be contacted about your referral to sports medicine. Complete lab work prior to leaving.

## 2018-07-03 NOTE — Progress Notes (Signed)
Subjective:    Patient ID: Maria Hernandez, female    DOB: 1975-10-11, 42 y.o.   MRN: [id number]  HPI  Maria Hernandez is a 42 yr old female who presents today with chief complaint of bilateral knee pain. Bilateral knee pain after lots of walking. Started hurting 4-5 days later.  Reports that pain has been present x 1 week.  Taking naproxen, icing, arthritis cream with minimal improvement. Knee caps feel like they are "pinching."   Review of Systems    see HPI  Past Medical History:  Diagnosis Date  . Migraines      Social History   Socioeconomic History  . Marital status: Single    Spouse name: Not on file  . Number of children: Not on file  . Years of education: Not on file  . Highest education level: Not on file  Occupational History  . Not on file  Social Needs  . Financial resource strain: Not on file  . Food insecurity:    Worry: Not on file    Inability: Not on file  . Transportation needs:    Medical: Not on file    Non-medical: Not on file  Tobacco Use  . Smoking status: Never Smoker  . Smokeless tobacco: Never Used  Substance and Sexual Activity  . Alcohol use: Yes    Frequency: Never    Comment: occasional wine "1- 2 per month"  . Drug use: Never  . Sexual activity: Not Currently    Birth control/protection: None  Lifestyle  . Physical activity:    Days per week: Not on file    Minutes per session: Not on file  . Stress: Not on file  Relationships  . Social connections:    Talks on phone: Not on file    Gets together: Not on file    Attends religious service: Not on file    Active member of club or organization: Not on file    Attends meetings of clubs or organizations: Not on file    Relationship status: Not on file  . Intimate partner violence:    Fear of current or ex partner: Not on file    Emotionally abused: Not on file    Physically abused: Not on file    Forced sexual activity: Not on file  Other Topics Concern  . 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 AUGMENTATION MAMMAPLASTY    . BREAST ENHANCEMENT SURGERY Bilateral 2005  . INNER EAR SURGERY Left    Reports 2 prior ear surgeries for prosthesis. 3rd surgery coming up in January 2019  . MANDIBLE OSTEOTOMY  1995    Family History  Problem Relation Age of Onset  . Hypothyroidism Mother   . Hypertension Father   . Diabetes type II Father   . Breast cancer Maternal Grandmother     Allergies  Allergen Reactions  . Verapamil Hives  . Macrobid [Nitrofurantoin Macrocrystal]     Light headed    Current Outpatient Medications on File Prior to Visit  Medication Sig Dispense Refill  . ibuprofen (ADVIL,MOTRIN) 200 MG tablet Take 200 mg by mouth as needed.    Marland Kitchen  Multiple Vitamin (MULTIVITAMIN) tablet Take 1 tablet by mouth daily.    . SUMAtriptan (IMITREX) 50 MG tablet TAKE 1 TABLET BY MOUTH BY MOUTH AS NEEDED FOR MIGRAINE HEADACHE. MAY REPEAT IN 2 HOURS AND TAKE 1 TABLET IF NEEDED. 10 tablet 5   No current facility-administered medications on file prior to visit.     BP 116/69 (BP Location: Left Arm, Patient Position: Sitting, Cuff Size: Small)   Pulse 76   Temp 98.8 F (37.1 C) (Oral)   Resp 16   Ht '5\' 7"'$  (1.702 m)   Wt 157 lb (71.2 kg)   SpO2 100%   BMI 24.59 kg/m    Objective:   Physical Exam Constitutional:      Appearance: She is well-developed.  Cardiovascular:     Rate and Rhythm: Normal rate and regular rhythm.     Heart sounds: Normal heart sounds. No murmur.  Pulmonary:     Effort: Pulmonary effort is normal. No respiratory distress.     Breath sounds: Normal breath sounds. No wheezing.  Musculoskeletal:     Comments: Trace swelling noted above the right knee.   Psychiatric:        Behavior: Behavior  normal.        Thought Content: Thought content normal.        Judgment: Judgment normal.           Assessment & Plan:  Bilateral knee pain- refer to sports medicine for further evaluation. Trial of meloxicam.  ANA, Rheumatoid factor and ESR were performed and are all WNL.

## 2018-07-04 LAB — SEDIMENTATION RATE: Sed Rate: 5 mm/hr (ref 0–20)

## 2018-07-06 LAB — ANA: Anti Nuclear Antibody(ANA): NEGATIVE

## 2018-07-06 LAB — RHEUMATOID FACTOR: Rhuematoid fact SerPl-aCnc: <14 IU/mL (ref <14)

## 2018-07-07 ENCOUNTER — Encounter: Payer: Self-pay | Admitting: Family Medicine

## 2018-07-07 ENCOUNTER — Ambulatory Visit: Payer: No Typology Code available for payment source | Admitting: Family Medicine

## 2018-07-07 VITALS — BP 137/86 | HR 61 | Ht 68.0 in | Wt 153.0 lb

## 2018-07-07 DIAGNOSIS — M25561 Pain in right knee: Secondary | ICD-10-CM

## 2018-07-07 DIAGNOSIS — M25521 Pain in right elbow: Secondary | ICD-10-CM

## 2018-07-07 DIAGNOSIS — M25562 Pain in left knee: Secondary | ICD-10-CM | POA: Diagnosis not present

## 2018-07-07 NOTE — Patient Instructions (Signed)
You have patellofemoral syndrome. Avoid painful activities when possible (often deep squats, lunges bother this). Cross train with swimming, cycling with low resistance, elliptical if needed. Straight leg raise, hip side raises, straight leg raises with foot turned outwards 3 sets of 10 once a day. Add ankle weight if these become too easy. Consider formal physical therapy. Correct foot breakdown with something like dr. Jari Sportsman active series, spencos, or our green sports insoles. Avoid flat shoes, barefoot walking as much as possible. Icing 15 minutes at a time 3-4 times a day as needed. Continue the meloxicam for another 7 days then as needed. Knee brace/sleeve may be beneficial as well as you've noted during work shifts, when doing a lot of walking. Follow up with me in 6 weeks.  Your elbow pain may be some mild synovitis vs triceps tendinitis but your exam is reassuring today - I suspect the meloxicam has helped this a lot - continue this as noted above.

## 2018-07-09 ENCOUNTER — Encounter: Payer: Self-pay | Admitting: Family Medicine

## 2018-07-09 NOTE — Progress Notes (Signed)
PCP and consultation requested by: Sandford Craze, NP  Subjective:   HPI: Patient is a 43 y.o. female here for bilateral knee, right elbow pain.  Patient reports for about 1.5 weeks she's had L > R knee pain about the kneecaps. Pain 7/10 on left, 5/10 on right and sharp. Worse with knee bent. 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 mobic has helped this and her elbow. Has had swelling in front of knee more on right side also. Left knee did hurt a couple years ago. Her right elbow pain has been off and on for 1.5 years and posterior. Pain level up to 6/10 at worst. She is right handed. No skin changes, numbness.  Past Medical History:  Diagnosis Date  . Migraines     Current Outpatient Medications on File Prior to Visit  Medication Sig Dispense Refill  . ibuprofen (ADVIL,MOTRIN) 200 MG tablet Take 200 mg by mouth as needed.    . meloxicam (MOBIC) 7.5 MG tablet Take 1 tablet (7.5 mg total) by mouth daily. 14 tablet 0  . Multiple Vitamin (MULTIVITAMIN) tablet Take 1 tablet by mouth daily.    . SUMAtriptan (IMITREX) 50 MG tablet TAKE 1 TABLET BY MOUTH BY MOUTH AS NEEDED FOR MIGRAINE HEADACHE. MAY REPEAT IN 2 HOURS AND TAKE 1 TABLET IF NEEDED. 10 tablet 5   No current facility-administered medications on file prior to visit.     Past Surgical History:  Procedure Laterality Date  . AUGMENTATION MAMMAPLASTY    . BREAST ENHANCEMENT SURGERY Bilateral 2005  . INNER EAR SURGERY Left    Reports 2 prior ear surgeries for prosthesis. 3rd surgery coming up in January 2019  . MANDIBLE OSTEOTOMY  1995    Allergies  Allergen Reactions  . Verapamil Hives  . Macrobid [Nitrofurantoin Macrocrystal]     Light headed    Social History   Socioeconomic History  . Marital status: Single    Spouse name: Not on file  . Number of children: Not on file  . Years of education: Not on file  . Highest education level: Not on file   Occupational History  . Not on file  Social Needs  . Financial resource strain: Not on file  . Food insecurity:    Worry: Not on file    Inability: Not on file  . Transportation needs:    Medical: Not on file    Non-medical: Not on file  Tobacco Use  . Smoking status: Never Smoker  . Smokeless tobacco: Never Used  Substance and Sexual Activity  . Alcohol use: Yes    Frequency: Never    Comment: occasional wine "1- 2 per month"  . Drug use: Never  . Sexual activity: Not Currently    Birth control/protection: None  Lifestyle  . Physical activity:    Days per week: Not on file    Minutes per session: Not on file  . Stress: Not on file  Relationships  . Social connections:    Talks on phone: Not on file    Gets together: Not on file    Attends religious service: Not on file    Active member of club or organization: Not on file    Attends meetings of clubs or organizations: Not on file    Relationship status: Not on file  . Intimate partner violence:    Fear of current or ex partner: Not on file    Emotionally  abused: Not on file    Physically abused: Not on file    Forced sexual activity: Not on file  Other Topics Concern  . 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 Hypothyroidism Mother   . Hypertension Father   . Diabetes type II Father   . Breast cancer Maternal Grandmother     BP 137/86   Pulse 61   Ht 5\' 8"  (1.727 m)   Wt 153 lb (69.4 kg)   BMI 23.26 kg/m   Review of Systems: See HPI above.     Objective:  Physical Exam:  Gen: NAD, comfortable in exam room  Mild pronation long arches.   Right knee: No gross deformity, ecchymoses, swelling.  VMO atrophy. Mild TTP post patellar facets. FROM with 5/5 strength flexion  and extension. Negative ant/post drawers. Negative valgus/varus testing. Negative lachmans. Negative mcmurrays, apleys, thessalys, patellar apprehension. NV intact distally.  Left knee: No gross deformity, ecchymoses, swelling.  VMO atrophy. Mild TTP post patellar facets. FROM with 5/5 strength flexion and extension. Negative ant/post drawers. Negative valgus/varus testing. Negative lachmans. Negative mcmurrays, apleys, thessalys, patellar apprehension. NV intact distally.  5-/5 strength bilateral hip abduction.  Right elbow: No deformity. FROM with 5/5 strength wrist and elbow motions, finger flexion and extension, pronation/supination. No tenderness to palpation. NVI distally. Collateral ligaments intact.   Assessment & Plan:  1. Bilateral knee pain - 2/2 patellofemoral syndrome.  Reviewed home exercise program to do daily.  Arch supports.  Icing, meloxicam for 7 more days then prn.  Knee brace/sleeve when up and walking around.  F/u in 6 weeks.   2. Right elbow pain - exam reassuring today.  Possibly related to mild synovitis or triceps tendinitis.  Meloxicam for 7 days then as needed.

## 2018-07-11 ENCOUNTER — Other Ambulatory Visit: Payer: Self-pay | Admitting: Family

## 2018-07-12 MED ORDER — MELOXICAM 7.5 MG PO TABS: 7.5000 mg | ORAL_TABLET | Freq: Every day | ORAL | 0 refills | Status: DC

## 2018-07-14 ENCOUNTER — Encounter: Payer: Self-pay | Admitting: Family

## 2018-07-14 ENCOUNTER — Ambulatory Visit (INDEPENDENT_AMBULATORY_CARE_PROVIDER_SITE_OTHER): Payer: No Typology Code available for payment source | Admitting: Family Medicine

## 2018-07-14 ENCOUNTER — Ambulatory Visit: Payer: No Typology Code available for payment source | Admitting: Family Medicine

## 2018-07-14 ENCOUNTER — Telehealth: Payer: No Typology Code available for payment source | Admitting: Family

## 2018-07-14 ENCOUNTER — Encounter: Payer: Self-pay | Admitting: Family Medicine

## 2018-07-14 VITALS — BP 106/70 | HR 95 | Temp 99.4°F | Resp 16 | Ht 68.0 in | Wt 155.6 lb

## 2018-07-14 DIAGNOSIS — R6889 Other general symptoms and signs: Secondary | ICD-10-CM

## 2018-07-14 DIAGNOSIS — J111 Influenza due to unidentified influenza virus with other respiratory manifestations: Secondary | ICD-10-CM

## 2018-07-14 DIAGNOSIS — R52 Pain, unspecified: Secondary | ICD-10-CM

## 2018-07-14 DIAGNOSIS — R5381 Other malaise: Secondary | ICD-10-CM

## 2018-07-14 LAB — POC INFLUENZA A&B (BINAX/QUICKVUE)
INFLUENZA A, POC: NEGATIVE
INFLUENZA B, POC: NEGATIVE

## 2018-07-14 MED ORDER — OSELTAMIVIR PHOSPHATE 75 MG PO CAPS: 75.0000 mg | ORAL_CAPSULE | Freq: Two times a day (BID) | ORAL | 0 refills | Status: DC

## 2018-07-14 NOTE — Progress Notes (Signed)
Thank you for the details you included in the comment boxes. Those details are very helpful in determining the best course of treatment for you and help [place] to provide the best care.  With the absence of the other flu-like symptoms, this could be many things. Please keep your appointment today with [place] so that they can evaluate you and provide a proper exam and lab testing.   Based on what you shared with me it looks like you have a serious condition that should be evaluated in a face to face office visit.  NOTE: If you entered your credit card information for this eVisit, you will not be charged. You may see a "hold" on your card for the $30 but that hold will drop off and you will not have a charge processed.  If you are having a true medical emergency please call 911.  If you need an urgent face to face visit, [place] has four urgent care centers for your convenience.  If you need care fast and have a high deductible or no insurance consider:   [email or web address] to reserve your spot online an avoid wait times  [facility] [address], [address] [place], [place] [address] 8 am to 8 pm Monday-Friday 10 am to 4 pm Saturday-Sunday *Across the street from [employer]  [address] [place], [address] 8 am to 5 pm Monday-Friday * In the [facility] on the [facility]   The following sites will take your  insurance:  . [facility]  [phone] Get Driving Directions Find a Provider at this Location  [address] [place], [place] [address] . 10 am to 8 pm Monday-Friday . 12 pm to 8 pm Saturday-Sunday   . [facility] at [facility]  [phone] Get Driving Directions Find a Provider at this Location  [address] [place], [place] [address] . 8 am to 8 pm Monday-Friday . 9 am to 6 pm Saturday . 11 am to 6 pm Sunday   . [facility] at  [facility]  [phone] Get Driving Directions  [address] [place], [place] [address] . 8 am to 8 pm Monday-Friday . 8 am to 4 pm Saturday-Sunday   Your e-visit answers were reviewed by a board certified advanced clinical practitioner to complete your personal care plan.  Thank you for using e-Visits.

## 2018-07-14 NOTE — Progress Notes (Signed)
ADDENDUM:  Added Tamiflu and asked pt to wait.   Constance Haw, DNP, FNP-BC [facility] E-Visit Team

## 2018-07-14 NOTE — Progress Notes (Signed)
Disregard last edit.  Pt decided to go to her appointment.   Maria Haw, DNP, FNP-BC [facility] E-Visit Team

## 2018-07-14 NOTE — Patient Instructions (Signed)

## 2018-07-14 NOTE — Progress Notes (Signed)
Patient ID: Maria Hernandez, female    DOB: 05-13-1976  Age: 43 y.o. MRN: [id number]    Subjective:  Subjective  HPI  Maria Hernandez presents for bodyaches , fever 100.5 after tylenol.   Started today-  Dry cough,  No congestion, no st.  She has been taking tylenol only.   Review of Systems  Constitutional: Positive for fever. Negative for appetite change, diaphoresis, fatigue and unexpected weight change.  Eyes: Negative for pain, redness and visual disturbance.  Respiratory: Negative for cough, chest tightness, shortness of breath and wheezing.   Cardiovascular: Negative for chest pain, palpitations and leg swelling.  Endocrine: Negative for cold intolerance, heat intolerance, polydipsia, polyphagia and polyuria.  Genitourinary: Negative for difficulty urinating, dysuria and frequency.  Musculoskeletal: Positive for myalgias.  Neurological: Negative for dizziness, light-headedness, numbness and headaches.    History Past Medical History:  Diagnosis Date  . Migraines     She has a past surgical history that includes Inner ear surgery (Left); Mandible Osteotomy (1995); Breast enhancement surgery (Bilateral, 2005); and Augmentation mammaplasty.   Her family history includes Breast cancer in her maternal grandmother; Diabetes type II in her father; Hypertension in her father; Hypothyroidism in her mother.She reports that she has never smoked. She has never used smokeless tobacco. She reports current alcohol use. She reports that she does not use drugs.  Current Outpatient Medications on File Prior to Visit  Medication Sig Dispense Refill  . ibuprofen (ADVIL,MOTRIN) 200 MG tablet Take 200 mg by mouth as needed.    . meloxicam (MOBIC) 7.5 MG tablet Take 1 tablet (7.5 mg total) by mouth daily. 14 tablet 0  . Multiple Vitamin (MULTIVITAMIN) tablet Take 1 tablet by mouth daily.    . SUMAtriptan (IMITREX) 50 MG tablet TAKE 1 TABLET BY MOUTH BY MOUTH AS NEEDED FOR MIGRAINE HEADACHE. MAY  REPEAT IN 2 HOURS AND TAKE 1 TABLET IF NEEDED. 10 tablet 5   No current facility-administered medications on file prior to visit.      Objective:  Objective  Physical Exam Vitals signs and nursing note reviewed.  Constitutional:      Appearance: She is well-developed. She is ill-appearing.  HENT:     Head: Normocephalic and atraumatic.  Eyes:     Conjunctiva/sclera: Conjunctivae normal.  Neck:     Musculoskeletal: Normal range of motion and neck supple.     Thyroid: No thyromegaly.     Vascular: No carotid bruit or JVD.  Cardiovascular:     Rate and Rhythm: Normal rate and regular rhythm.     Heart sounds: Normal heart sounds. No murmur.  Pulmonary:     Effort: Pulmonary effort is normal. No respiratory distress.     Breath sounds: Normal breath sounds. No wheezing or rales.  Chest:     Chest wall: No tenderness.  Neurological:     Mental Status: She is alert and oriented to person, place, and time.    BP 106/70 (BP Location: Left Arm, Cuff Size: Normal)   Pulse 95   Temp 99.4 F (37.4 C) (Oral)   Resp 16   Ht 5\' 8"  (1.727 m)   Wt 155 lb 9.6 oz (70.6 kg)   LMP 06/28/2018   SpO2 97%   BMI 23.66 kg/m  Wt Readings from Last 3 Encounters:  07/14/18 155 lb 9.6 oz (70.6 kg)  07/07/18 153 lb (69.4 kg)  07/03/18 157 lb (71.2 kg)     Lab Results  Component Value Date   WBC  6.5 04/10/2018   HGB 13.0 04/10/2018   HCT 39.3 04/10/2018   PLT 299.0 04/10/2018   GLUCOSE 77 04/10/2018   CHOL 177 04/10/2018   TRIG 100.0 04/10/2018   HDL 58.20 04/10/2018   LDLCALC 99 04/10/2018   ALT 12 04/10/2018   AST 15 04/10/2018   NA 138 04/10/2018   K 4.3 04/10/2018   CL 104 04/10/2018   CREATININE 0.73 04/10/2018   BUN 11 04/10/2018   CO2 26 04/10/2018   TSH 0.53 04/10/2018    No results found.   Assessment & Plan:  Plan  I am having Maria Hernandez start on oseltamivir. I am also having her maintain her ibuprofen, multivitamin, SUMAtriptan, and meloxicam.  Meds  ordered this encounter  Medications  . oseltamivir (TAMIFLU) 75 MG capsule    Sig: Take 1 capsule (75 mg total) by mouth 2 (two) times daily.    Dispense:  10 capsule    Refill:  0    Problem List Items Addressed This Visit    None    Visit Diagnoses    Generalized body aches    -  Primary   Relevant Orders   POC Influenza A&B (Binax test) (Completed)   Influenza       Relevant Medications   oseltamivir (TAMIFLU) 75 MG capsule    rest, drink plenty of fluids She did want tamiflu Out of work until no fever for 24 hours   Follow-up: Return if symptoms worsen or fail to improve.  Donato SchultzYvonne R Lowne Chase, DO

## 2018-07-16 ENCOUNTER — Encounter: Payer: Self-pay | Admitting: Family Medicine

## 2018-07-17 ENCOUNTER — Encounter: Payer: Self-pay | Admitting: Family Medicine

## 2018-07-17 ENCOUNTER — Telehealth: Payer: Self-pay | Admitting: Family

## 2018-07-17 NOTE — Telephone Encounter (Signed)
Copied from CRM [id number]. Topic: Quick Communication - See Telephone Encounter >> Jul 17, 2018  8:33 AM Jens Som A wrote: CRM for notification. See Telephone encounter for: 07/17/18.  Patient is calling regarding regarding [employer] paper work to be completed by Dr. Zola Button.  Thank you [phone]

## 2018-07-17 NOTE — Telephone Encounter (Signed)
Pt called in again about [employer] paper work and fit for duty paperwork. Office advised they hadn't received the fax/ advised Pt to have them re fax it to office.

## 2018-07-17 NOTE — Telephone Encounter (Signed)
Pt calling back stating that she need a return to work sent to her supervisor Lafonda Mosses the note should say return to work on Wednesday with no restrictions please call pt back at [phone]   Pt will call back with the fax number pt would like for it to be email to her at [email or web address]

## 2018-07-18 NOTE — Telephone Encounter (Signed)
Work note created and sent to patiet on mychart ok per [facility] for her to go back to work tomorrow with no restrictions.

## 2018-07-18 NOTE — Telephone Encounter (Signed)
Pt calling back stating that she need a return to work sent to her supervisor Lafonda Mosses the note should say return to work on Wednesday with no restrictions please call pt back at [phone] .pt would like for it to be email to her at [email or web address]

## 2018-07-19 ENCOUNTER — Telehealth: Payer: Self-pay | Admitting: Family Medicine

## 2018-07-19 NOTE — Telephone Encounter (Signed)
Copied from CRM [id number]. Topic: Quick Communication - See Telephone Encounter >> Jul 19, 2018  8:51 AM Fanny Bien wrote: CRM for notification. See Telephone encounter for: 07/19/18. Pt called and stated that dr Laury Axon filled out FMLA paper work for pt for the flu and needed these dates add to her leave 1/10 1/14 //15. Will be going back on 07/21/18. Please advise.

## 2018-07-19 NOTE — Telephone Encounter (Signed)
New communication tetter sent to patient on MyChart to return to work on Friday 07-21-18.

## 2018-07-19 NOTE — Telephone Encounter (Signed)
Pt called and stated that she felt weak this morning and would like to return 07/21/18. Pt would need a new note. Please advise. [phone]

## 2018-07-20 NOTE — Progress Notes (Signed)
Disregard visit. See Mr Netty StarringWithrow's notes.

## 2018-07-21 ENCOUNTER — Telehealth: Payer: Self-pay | Admitting: *Deleted

## 2018-07-24 NOTE — Telephone Encounter (Signed)
Patient is calling and states [employer] has been reaching out to her every day saying this is time sensitive to get turned in. Patient states when she originally called about the FMLA paper work she was told it would take 5/7 business days. She states she originally called on 07/16/18 and is wanting to make sure this gets taken care of in the time it needs. Please advise. Pt # [phone]

## 2018-07-27 NOTE — Telephone Encounter (Signed)
Paperwork was faxed on 07/24/18; have resent via fax to [employer] today with confirmation; [facility] with contact name and number to inform patient/SLS 01/23

## 2018-09-11 ENCOUNTER — Ambulatory Visit (INDEPENDENT_AMBULATORY_CARE_PROVIDER_SITE_OTHER): Payer: No Typology Code available for payment source | Admitting: Family Medicine

## 2018-09-11 ENCOUNTER — Encounter: Payer: Self-pay | Admitting: Family Medicine

## 2018-09-11 ENCOUNTER — Other Ambulatory Visit (HOSPITAL_COMMUNITY)
Admission: RE | Admit: 2018-09-11 | Discharge: 2018-09-11 | Disposition: A | Discharge status: Home / Self Care | Payer: No Typology Code available for payment source | Source: Ambulatory Visit | Attending: Family Medicine | Admitting: Family Medicine

## 2018-09-11 VITALS — BP 128/78 | HR 63 | Temp 97.8°F | Ht 67.5 in | Wt 160.0 lb

## 2018-09-11 DIAGNOSIS — N9089 Other specified noninflammatory disorders of vulva and perineum: Secondary | ICD-10-CM | POA: Diagnosis present

## 2018-09-11 MED ORDER — METRONIDAZOLE 500 MG PO TABS: 500.0000 mg | ORAL_TABLET | Freq: Two times a day (BID) | ORAL | 0 refills | Status: AC

## 2018-09-11 MED ORDER — FLUCONAZOLE 150 MG PO TABS: 150.0000 mg | ORAL_TABLET | Freq: Once | ORAL | 0 refills | Status: AC

## 2018-09-11 NOTE — Patient Instructions (Addendum)
Avoid shaving/scented products over area for next 7-10 days.  Emollients such as Vaseline and Aquaphor will be important moving forward to form a protective barrier. Use this as needed.   Give [place] several business days to get result of swab back.   If you take the Flagyl, do not drink alcohol on it.   Let us know if you need anything.

## 2018-09-11 NOTE — Progress Notes (Signed)
Chief Complaint  Patient presents with  . Follow-up    labia irratation    Maria Hernandez is a 43 y.o. female here for a skin complaint.  Duration: 1 week Location: L labia Pruritic? No Painful? Yes Drainage? No New soaps/lotions/topicals/detergents? No Other associated symptoms: Striations on inner side Therapies tried thus far: some creams Worried it may be BV as it had presented like this before  ROS:  Const: No fevers Skin: As noted in HPI  Past Medical History:  Diagnosis Date  . Migraines     BP 128/78 (BP Location: Left Arm, Patient Position: Sitting, Cuff Size: Normal)   Pulse 63   Temp 97.8 F (36.6 C) (Oral)   Ht 5' 7.5" (1.715 m)   Wt 160 lb (72.6 kg)   SpO2 99%   BMI 24.69 kg/m  Gen: awake, alert, appearing stated age Lungs: No accessory muscle use Skin: examined in presence of female chaperone- L labia with excoriation/raw skin. No drainage, erythema, fluctuance, excessive warmth, no vag d/c Psych: Age appropriate judgment and insight  Labia irritation - Plan: metroNIDAZOLE (FLAGYL) 500 MG tablet, fluconazole (DIFLUCAN) 150 MG tablet, Cervicovaginal ancillary only( [place]), Cervicovaginal ancillary only( )  Self swabbed for BV as similar presentation in past. Called in medication should it be positive. Emollient, avoid shaving and scented products.  F/u prn. The patient voiced understanding and agreement to the plan.  Jilda Roche [place], DO 09/11/18 12:01 PM

## 2018-09-13 ENCOUNTER — Encounter: Payer: Self-pay | Admitting: Family Medicine

## 2018-09-13 LAB — CERVICOVAGINAL ANCILLARY ONLY
Bacterial vaginitis: NEGATIVE
Candida vaginitis: NEGATIVE
Trichomonas: NEGATIVE

## 2018-10-16 MED ORDER — DICLOFENAC SODIUM 75 MG PO TBEC: 75.0000 mg | DELAYED_RELEASE_TABLET | Freq: Two times a day (BID) | ORAL | 1 refills | Status: DC

## 2019-01-28 ENCOUNTER — Encounter: Payer: Self-pay | Admitting: Family

## 2019-01-28 DIAGNOSIS — Z9889 Other specified postprocedural states: Secondary | ICD-10-CM

## 2019-02-07 ENCOUNTER — Encounter: Payer: Self-pay | Admitting: Family

## 2019-02-16 ENCOUNTER — Other Ambulatory Visit: Payer: Self-pay

## 2019-02-16 ENCOUNTER — Ambulatory Visit (INDEPENDENT_AMBULATORY_CARE_PROVIDER_SITE_OTHER): Payer: No Typology Code available for payment source | Admitting: Family

## 2019-02-16 ENCOUNTER — Encounter: Payer: Self-pay | Admitting: Family

## 2019-02-16 VITALS — BP 122/83 | HR 58 | Temp 98.0°F | Resp 16 | Ht 67.0 in | Wt 159.0 lb

## 2019-02-16 DIAGNOSIS — H6981 Other specified disorders of Eustachian tube, right ear: Secondary | ICD-10-CM

## 2019-02-16 NOTE — Progress Notes (Signed)
Subjective:    Patient ID: Maria Hernandez, female    DOB: 04/10/1976, 43 y.o.   MRN: [id number]  HPI  Patient is a 43 yr old female who presents today with chief complaint of right sided ear pain. Denies associated drainage.  Reports that 2 weeks ago she had some "air bubbles in her right ear."  Reports that she called ENT who gave her antibiotic drops.  Pain resolved when she stopped abx. Has used sudafed and zyrtec. Yesterday was the first day in 10 days that she has had "no air bubbles."    Denies fever.    Review of Systems    see HPI  Past Medical History:  Diagnosis Date  . Migraines      Social History   Socioeconomic History  . Marital status: Single    Spouse name: Not on file  . Number of children: Not on file  . Years of education: Not on file  . Highest education level: Not on file  Occupational History  . Not on file  Social Needs  . Financial resource strain: Not on file  . Food insecurity    Worry: Not on file    Inability: Not on file  . Transportation needs    Medical: Not on file    Non-medical: Not on file  Tobacco Use  . Smoking status: Never Smoker  . Smokeless tobacco: Never Used  Substance and Sexual Activity  . Alcohol use: Yes    Frequency: Never    Comment: occasional wine "1- 2 per month"  . Drug use: Never  . Sexual activity: Not Currently    Birth control/protection: None  Lifestyle  . Physical activity    Days per week: Not on file    Minutes per session: Not on file  . Stress: Not on file  Relationships  . Social Musicianconnections    Talks on phone: Not on file    Gets together: Not on file    Attends religious service: Not on file    Active member of club or organization: Not on file    Attends meetings of clubs or organizations: Not on file    Relationship status: Not on file  . Intimate partner violence    Fear of current or ex partner: Not on file    Emotionally abused: Not on file    Physically abused: Not on file   Forced sexual activity: Not on file  Other Topics Concern  . 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 AUGMENTATION MAMMAPLASTY    . BREAST ENHANCEMENT SURGERY Bilateral 2005  . INNER EAR SURGERY Left    Reports 2 prior ear surgeries for prosthesis. 3rd surgery coming up in January 2019  . MANDIBLE OSTEOTOMY  1995    Family History  Problem Relation Age of Onset  . Hypothyroidism Mother   . Hypertension Father   . Diabetes type II Father   . Breast cancer Maternal Grandmother     Allergies  Allergen Reactions  . Verapamil Hives  . Macrobid [Nitrofurantoin Macrocrystal]     Light headed    Current Outpatient Medications on File Prior to Visit  Medication Sig Dispense Refill  . diclofenac (VOLTAREN) 75 MG EC tablet Take 1 tablet (75 mg total) by mouth 2 (two) times daily. 60 tablet 1  . ibuprofen (ADVIL,MOTRIN) 200 MG tablet Take 200 mg by mouth as needed.    . meloxicam (MOBIC) 7.5 MG tablet Take 1 tablet (7.5 mg total) by mouth daily. 14 tablet 0  . Multiple Vitamin (MULTIVITAMIN) tablet Take 1 tablet by mouth daily.    . SUMAtriptan (IMITREX) 50 MG tablet TAKE 1 TABLET BY MOUTH BY MOUTH AS NEEDED FOR MIGRAINE HEADACHE. MAY REPEAT IN 2 HOURS AND TAKE 1 TABLET IF NEEDED. 10 tablet 5   No current facility-administered medications on file prior to visit.     BP 122/83 (BP Location: Right Arm, Patient Position: Sitting, Cuff Size: Small)   Pulse (!) 58   Temp 98 F (36.7 C) (Temporal)   Resp 16   Ht 5\' 7"  (1.702 m)   Wt 159 lb (72.1 kg)   SpO2 100%   BMI 24.90 kg/m    Objective:   Physical Exam Constitutional:      Appearance: She is well-developed.  HENT:     Head: Normocephalic and atraumatic.     Right Ear: Ear canal and  external ear normal. Tympanic membrane is not perforated, erythematous, retracted or bulging.     Left Ear: Ear canal and external ear normal.     Ears:     Comments: Scarring noted R TM from previous tympanostomy tube. TM is mildly dull, some clear fluid noted behind TM.   L TM abnormal due to multiple surgeries.  No erythema  Neck:     Musculoskeletal: Neck supple.     Thyroid: No thyromegaly.  Cardiovascular:     Rate and Rhythm: Normal rate and regular rhythm.     Heart sounds: Normal heart sounds. No murmur.  Pulmonary:     Effort: Pulmonary effort is normal. No respiratory distress.     Breath sounds: Normal breath sounds. No wheezing.  Skin:    General: Skin is warm and dry.  Neurological:     Mental Status: She is alert and oriented to person, place, and time.  Psychiatric:        Behavior: Behavior normal.        Thought Content: Thought content normal.        Judgment: Judgment normal.           Assessment & Plan:  Eustachian tube dysfunction- Patient is advised as follows:  Add zyrtec once daily and continue sudafed as needed. Continue flonase 2 sprays each nostril once daily.  Call if you develop fever, increased pain.  Keep upcoming appointment with ENT.

## 2019-02-16 NOTE — Patient Instructions (Addendum)
Add zyrtec once daily and continue sudafed as needed. Continue flonase 2 sprays each nostril once daily.  Call if you develop fever, increased pain.  Keep upcoming appointment with ENT.

## 2019-03-08 ENCOUNTER — Other Ambulatory Visit: Payer: Self-pay

## 2019-03-08 ENCOUNTER — Ambulatory Visit (INDEPENDENT_AMBULATORY_CARE_PROVIDER_SITE_OTHER): Payer: No Typology Code available for payment source | Admitting: Otolaryngology

## 2019-03-08 DIAGNOSIS — H6981 Other specified disorders of Eustachian tube, right ear: Secondary | ICD-10-CM

## 2019-03-08 DIAGNOSIS — H9012 Conductive hearing loss, unilateral, left ear, with unrestricted hearing on the contralateral side: Secondary | ICD-10-CM

## 2019-03-14 ENCOUNTER — Other Ambulatory Visit: Payer: Self-pay | Admitting: Family

## 2019-03-26 ENCOUNTER — Encounter: Payer: Self-pay | Admitting: Family Medicine

## 2019-04-26 ENCOUNTER — Encounter: Payer: Self-pay | Admitting: Family

## 2019-05-01 ENCOUNTER — Encounter: Payer: Self-pay | Admitting: Family

## 2019-05-01 ENCOUNTER — Ambulatory Visit (INDEPENDENT_AMBULATORY_CARE_PROVIDER_SITE_OTHER): Payer: No Typology Code available for payment source | Admitting: Family

## 2019-05-01 DIAGNOSIS — M5416 Radiculopathy, lumbar region: Secondary | ICD-10-CM

## 2019-05-01 MED ORDER — METHYLPREDNISOLONE 4 MG PO TBPK: ORAL_TABLET | ORAL | 0 refills | Status: DC

## 2019-05-01 NOTE — Progress Notes (Signed)
Virtual Visit via Video Note  I connected with Maria Hernandez on 05/01/19 at  1:40 PM EDT by a video enabled telemedicine application and verified that I am speaking with the correct person using two identifiers.  Location: Patient: home Provider: work   I discussed the limitations of evaluation and management by telemedicine and the availability of in person appointments. The patient expressed understanding and agreed to proceed.  History of Present Illness:  Patient is a 43 yr old female who presents today with chief complaint of back pain. Reports that she has had pain for 1.5 weeks.  Reports that she had pain shooting beneath both breasts.  Yesterday she developed pain down the left leg. Worse with raising her left leg. Has tried motrin and diclofenac, meloxicam. Icing without improvement.   Past Medical History:  Diagnosis Date  . Migraines      Social History   Socioeconomic History  . Marital status: Single    Spouse name: Not on file  . Number of children: Not on file  . Years of education: Not on file  . Highest education level: Not on file  Occupational History  . Not on file  Social Needs  . Financial resource strain: Not on file  . Food insecurity    Worry: Not on file    Inability: Not on file  . Transportation needs    Medical: Not on file    Non-medical: Not on file  Tobacco Use  . Smoking status: Never Smoker  . Smokeless tobacco: Never Used  Substance and Sexual Activity  . Alcohol use: Yes    Frequency: Never    Comment: occasional wine "1- 2 per month"  . Drug use: Never  . Sexual activity: Not Currently    Birth control/protection: None  Lifestyle  . Physical activity    Days per week: Not on file    Minutes per session: Not on file  . Stress: Not on file  Relationships  . Social Herbalist on phone: Not on file    Gets together: Not on file    Attends religious service: Not on file    Active member of club or organization: Not  on file    Attends meetings of clubs or organizations: Not on file    Relationship status: Not on file  . Intimate partner violence    Fear of current or ex partner: Not on file    Emotionally abused: Not on file    Physically abused: Not on file    Forced sexual activity: Not on file  Other Topics Concern  . 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 AUGMENTATION MAMMAPLASTY    . BREAST ENHANCEMENT SURGERY Bilateral 2005  . INNER EAR SURGERY Left    Reports 2 prior ear surgeries for prosthesis. 3rd surgery coming up in January 2019  . MANDIBLE OSTEOTOMY  1995    Family History  Problem Relation Age of Onset  . Hypothyroidism Mother   . Hypertension Father   . Diabetes type II Father   . Breast cancer Maternal Grandmother     Allergies  Allergen Reactions  .  Verapamil Hives  . Macrobid [Nitrofurantoin Macrocrystal]     Light headed    Current Outpatient Medications on File Prior to Visit  Medication Sig Dispense Refill  . diclofenac (VOLTAREN) 75 MG EC tablet Take 1 tablet (75 mg total) by mouth 2 (two) times daily. 60 tablet 1  . ibuprofen (ADVIL,MOTRIN) 200 MG tablet Take 200 mg by mouth as needed.    . Multiple Vitamin (MULTIVITAMIN) tablet Take 1 tablet by mouth daily.    . SUMAtriptan (IMITREX) 50 MG tablet TAKE 1 TABLET BY MOUTH BY MOUTH AS NEEDED FOR MIGRAINE HEADACHE. MAY REPEAT IN 2 HOURS AND TAKE 1 TABLET IF NEEDED. 10 tablet 5   No current facility-administered medications on file prior to visit.     There were no vitals taken for this visit.     Observations/Objective:   Gen: Awake, alert, no acute distress Resp: Breathing is even and non-labored Psych: calm/pleasant demeanor Neuro: Alert and Oriented x 3, + facial  symmetry, speech is clear.   Assessment and Plan:  Lumbar radiculopathy- will give trial of medrol dose pak. Advised pt not to mix nsaids. OK to use tylenol prn pain.  She is advised to call if symptoms worsen or if symptoms fail to improve.    Follow Up Instructions:    I discussed the assessment and treatment plan with the patient. The patient was provided an opportunity to ask questions and all were answered. The patient agreed with the plan and demonstrated an understanding of the instructions.   The patient was advised to call back or seek an in-person evaluation if the symptoms worsen or if the condition fails to improve as anticipated.  Lemont Fillers, NP

## 2019-05-04 ENCOUNTER — Ambulatory Visit: Payer: No Typology Code available for payment source | Admitting: Family

## 2019-05-04 MED ORDER — HYDROCODONE-ACETAMINOPHEN 5-325 MG PO TABS: 1.0000 | ORAL_TABLET | Freq: Four times a day (QID) | ORAL | 0 refills | Status: DC | PRN

## 2019-05-07 ENCOUNTER — Encounter: Payer: Self-pay | Admitting: Family

## 2019-05-07 ENCOUNTER — Other Ambulatory Visit: Payer: Self-pay

## 2019-05-07 ENCOUNTER — Ambulatory Visit: Payer: No Typology Code available for payment source | Admitting: Family Medicine

## 2019-05-07 VITALS — BP 108/72 | Ht 67.0 in | Wt 139.0 lb

## 2019-05-07 DIAGNOSIS — M25562 Pain in left knee: Secondary | ICD-10-CM

## 2019-05-07 DIAGNOSIS — M25561 Pain in right knee: Secondary | ICD-10-CM

## 2019-05-07 DIAGNOSIS — M25569 Pain in unspecified knee: Secondary | ICD-10-CM

## 2019-05-07 DIAGNOSIS — M25521 Pain in right elbow: Secondary | ICD-10-CM | POA: Diagnosis not present

## 2019-05-07 DIAGNOSIS — M5416 Radiculopathy, lumbar region: Secondary | ICD-10-CM

## 2019-05-07 MED ORDER — MELOXICAM 15 MG PO TABS: 15.0000 mg | ORAL_TABLET | Freq: Every day | ORAL | 1 refills | Status: DC | PRN

## 2019-05-07 NOTE — Patient Instructions (Signed)
You have medial epicondylitis (golfer's elbow) Avoid painful activities as much as possible (unless doing home exercises). Meloxicam or diclofenac as needed for pain and inflammation. Icing 3-4 times a day - careful around ulnar nerve on inside of elbow. Strengthening with 1 pound weight pronation/supination, wrist flexion, stretching exercises (hold stretches 20-30 seconds and repeat 3 times, exercises 3 sets of 10 of each). Sleeve or counterforce brace may be helpful to unload area of pain while it heals. Consider formal physical therapy, nitro patches, injection if not improving with home exercises. Follow up with me in 6 weeks for this.  We will go ahead with an MRI of your more symptomatic left knee.

## 2019-05-07 NOTE — Telephone Encounter (Signed)
Spoke with patient about this in today's visit.

## 2019-05-07 NOTE — Progress Notes (Signed)
PCP: Sandford Craze'Sullivan, Melissa, NP  Subjective:   HPI: Patient is a 43 y.o. female here for evaluation of bilateral knee and right elbow pain.  Bilateral knee pain: Previously diagnosed with patellofemoral syndrome in 07/2018, did well until the past month where she has had significant worsening of her knee pain.  Further exacerbation within the past 4-5 days, intolerable pain.  "Nagging pain," throbbing underneath the kneecaps with pinching/burning sensation medially that will shoot down into her shins. L knee > R.  Also has had some popping in both knees.  Worse with standing for long periods of time, stairs, squatting.  Had to call out from work yesterday due to severity (works as an Charity fundraiserN, 12 hour shifts).  Has been using diclofenac, norco, and knee braces to help-which has made a minimal improvement of symptoms.  Also was started on a steroid pack (on day 5) due to recent lumbar radiculopathy, has not made a difference in her knee pains. No known injury/trauma. Denies any fever, rash, knee swelling.   Right elbow: Deep pain, throbs at night.  On medial aspect of elbow.  Worse with wrist twisting motion.  Has been wearing an elbow brace for the past month, some improvement with this. Denies any associated weakness, numbness, neck pain.   Past Medical History:  Diagnosis Date  . Migraines     Current Outpatient Medications on File Prior to Visit  Medication Sig Dispense Refill  . diclofenac (VOLTAREN) 75 MG EC tablet Take 1 tablet (75 mg total) by mouth 2 (two) times daily. 60 tablet 1  . HYDROcodone-acetaminophen (NORCO) 5-325 MG tablet Take 1 tablet by mouth every 6 (six) hours as needed for moderate pain. 20 tablet 0  . ibuprofen (ADVIL,MOTRIN) 200 MG tablet Take 200 mg by mouth as needed.    . methylPREDNISolone (MEDROL DOSEPAK) 4 MG TBPK tablet Take orally per package instructions 21 tablet 0  . Multiple Vitamin (MULTIVITAMIN) tablet Take 1 tablet by mouth daily.    . SUMAtriptan (IMITREX) 50 MG  tablet TAKE 1 TABLET BY MOUTH BY MOUTH AS NEEDED FOR MIGRAINE HEADACHE. MAY REPEAT IN 2 HOURS AND TAKE 1 TABLET IF NEEDED. 10 tablet 5   No current facility-administered medications on file prior to visit.     Past Surgical History:  Procedure Laterality Date  . AUGMENTATION MAMMAPLASTY    . BREAST ENHANCEMENT SURGERY Bilateral 2005  . INNER EAR SURGERY Left    Reports 2 prior ear surgeries for prosthesis. 3rd surgery coming up in January 2019  . MANDIBLE OSTEOTOMY  1995    Allergies  Allergen Reactions  . Verapamil Hives  . Macrobid [Nitrofurantoin Macrocrystal]     Light headed    Social History   Socioeconomic History  . Marital status: Single    Spouse name: Not on file  . Number of children: Not on file  . Years of education: Not on file  . Highest education level: Not on file  Occupational History  . Not on file  Social Needs  . Financial resource strain: Not on file  . Food insecurity    Worry: Not on file    Inability: Not on file  . Transportation needs    Medical: Not on file    Non-medical: Not on file  Tobacco Use  . Smoking status: Never Smoker  . Smokeless tobacco: Never Used  Substance and Sexual Activity  . Alcohol use: Yes    Frequency: Never    Comment: occasional wine "1- 2 per month"  .  Drug use: Never  . Sexual activity: Not Currently    Birth control/protection: None  Lifestyle  . Physical activity    Days per week: Not on file    Minutes per session: Not on file  . Stress: Not on file  Relationships  . Social Herbalist on phone: Not on file    Gets together: Not on file    Attends religious service: Not on file    Active member of club or organization: Not on file    Attends meetings of clubs or organizations: Not on file    Relationship status: Not on file  . Intimate partner violence    Fear of current or ex partner: Not on file    Emotionally abused: Not on file    Physically abused: Not on file    Forced sexual  activity: Not on file  Other Topics Concern  . 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 Hypothyroidism Mother   . Hypertension Father   . Diabetes type II Father   . Breast cancer Maternal Grandmother     BP 108/72   Ht 5\' 7"  (1.702 m)   Wt 139 lb (63 kg)   BMI 21.77 kg/m   Review of Systems: See HPI above.     Objective:  Physical Exam:  Gen: NAD, comfortable in exam room  Left knee: Inspection: No overlying erythema, deformities, joint swelling.  Poor quadriceps definition. Palpation: Tenderness medial, lateral joint lines, and patellofemoral compartment with most severe on palpation of medial joint space ROM: Full ROM with knee flexion/extension Strength: 5/5 muscle strength with knee flexion/extension, hip flexion/extension Special tests: Positive McMurray's with medial pain, positive Thessaly's with medial pain, negative Lachman's, valgus/varus stress.  Negative ant/post drawer. Neurovascularly intact distally. No joint instability.   Right knee: Inspection: No overlying erythema, deformities, joint swelling.  Poor quadriceps definition. Palpation: Tenderness medial, lateral joint lines, and patellofemoral compartment with most severe tenderness with palpation of the medial joint space ROM: Full ROM with knee flexion/extension Strength: 5/5 muscle strength with knee flexion/extension, hip flexion/extension Special tests: Positive McMurray's with medial pain, positive Thessaly's with medial pain, negative Lachman's, valgus/varus stress.  Negative ant/post drawer. Neurovascularly intact distally. No joint instability.   Right elbow: Inspection: No noted deformities, swelling, overlying erythema Palpation: Tenderness along medial  epicondyle ROM: Full ROM with elbow flexion/extension, wrist flexion/extension/pronation/supination.  Pain around medial epicondyle with passive wrist pronation. Strength: 5/5 muscle strength with elbow flexion/extension, wrist flexion/extension.  Tenderness elicited in the medial epicondyle with resisted wrist flexion.  Collateral ligaments intact. 2+ radial pulses bilaterally, sensation to light touch intact distally.    Assessment & Plan:   Bilateral knee pain: Acute, worsening. L>R. Differential including meniscal pathology (likely medial), osteo-arthritis, vs inflammatory arthritis.  However, suspect secondary to meniscal injuries/tear with significant tenderness around medial joint line and positive McMurray/Thessaly's tests.  Less concern for arthritis at this severity in an otherwise healthy 43 year old female with normal BMI.  Do believe she also continues to struggle with patellofemoral syndrome, however would expect her to have improved with conservative measures if this was primary cause of her pain and  typically wouldn't be this severe. -Obtain left knee MRI to r/o meniscal/ligamentous injury -Conservative measures including knee braces for comfort, rest, norco for severe pain, diclofenac, ice as needed -Will provide work note/paperwork excuse for at least the next week  Medial epicondylitis: Acute. Tenderness along medial epicondyle with pain elicited with wrist pronation and resisted wrist flexion, in the setting of repetitive wrist twisting at work.  Will provide rehab exercises and encouraged continued use of elbow brace and avoiding painful activities as possible.  May use NSAIDs and ice as needed to improve pain and inflammation.  Will consider physical therapy and/or injection if not improving with conservative therapy.  Follow-up in 6 weeks for above or sooner if needed.  Allayne Stack, DO  Family Medicine PGY-2

## 2019-05-08 ENCOUNTER — Encounter: Payer: Self-pay | Admitting: Family Medicine

## 2019-05-08 ENCOUNTER — Ambulatory Visit (INDEPENDENT_AMBULATORY_CARE_PROVIDER_SITE_OTHER): Payer: No Typology Code available for payment source | Admitting: Family Medicine

## 2019-05-08 DIAGNOSIS — M25562 Pain in left knee: Secondary | ICD-10-CM

## 2019-05-08 DIAGNOSIS — M25561 Pain in right knee: Secondary | ICD-10-CM

## 2019-05-08 MED ORDER — METHYLPREDNISOLONE ACETATE 40 MG/ML IJ SUSP
40.0000 mg | Freq: Once | INTRAMUSCULAR | Status: AC
  Administered 2019-05-08: 40 mg via INTRA_ARTICULAR

## 2019-05-08 NOTE — Progress Notes (Signed)
Patient came in today for bilateral knee injections while she awaits MRI that's scheduled for about 2 weeks from now.  After informed written consent timeout was performed, patient was seated on exam table. Right knee was prepped with alcohol swab and utilizing anteromedial approach, patient's right knee was injected intraarticularly with 3:1 bupivicaine: depomedrol. Patient tolerated the procedure well without immediate complications.  After informed written consent timeout was performed, patient was seated on exam table. Left knee was prepped with alcohol swab and utilizing anteromedial approach, patient's left knee was injected intraarticularly with 3:1 bupivicaine: depomedrol. Patient tolerated the procedure well without immediate complications.

## 2019-05-08 NOTE — Addendum Note (Signed)
Addended by: Debbrah Alar on: 05/08/2019 02:17 PM   Modules accepted: Orders

## 2019-05-09 ENCOUNTER — Ambulatory Visit: Payer: No Typology Code available for payment source | Admitting: Family Medicine

## 2019-05-09 ENCOUNTER — Other Ambulatory Visit: Payer: Self-pay

## 2019-05-09 ENCOUNTER — Encounter: Payer: Self-pay | Admitting: Family Medicine

## 2019-05-09 VITALS — BP 108/80 | Ht 67.0 in | Wt 139.0 lb

## 2019-05-09 DIAGNOSIS — M545 Low back pain, unspecified: Secondary | ICD-10-CM | POA: Insufficient documentation

## 2019-05-09 DIAGNOSIS — G8929 Other chronic pain: Secondary | ICD-10-CM | POA: Diagnosis not present

## 2019-05-09 MED ORDER — NORTRIPTYLINE HCL 25 MG PO CAPS: 25.0000 mg | ORAL_CAPSULE | Freq: Every day | ORAL | 2 refills | Status: DC

## 2019-05-09 NOTE — Assessment & Plan Note (Signed)
Patient with acute on chronic lower back pain.  Now with new neuropathic type symptoms on the right side.  Patient has been trying extensive conservative modalities including NSAIDs, opioids, icing and heat, steroids.  Patient has not had any imaging in several years.  Discussed issue with patient extensively. - Will pursue imaging, with MRI lumbar spine without contrast.  - Will start patient on nortriptyline 25 mg. - Patient to follow-up in 2 weeks either by call or clinic.

## 2019-05-09 NOTE — Patient Instructions (Signed)
It was a pleasure to see you today!  Maria Hernandez was seen for lower back pain.   For your lower back pain, we are going to get an MRI of your lumbar spine.  You will be called by the imaging center to schedule this.  We are also starting you on a new medicine called nortriptyline.  Please take 1/2 tablet at night for 3 days.  If you are tolerating this well, we will increase it to 1 full tablet.  Please call the clinic or schedule an appointment in 2 weeks or sooner if you are not having any improvement.    Best,  Marny Lowenstein, MD, MS FAMILY MEDICINE RESIDENT - PGY3 05/09/2019 9:36 AM

## 2019-05-09 NOTE — Progress Notes (Signed)
.      Subjective:  Maria Hernandez is a 43 y.o. female who presents to the [facility] today with a chief complaint of lower back pain.   HPI:  Patient complains of 4 days of right lower back pain.  Says it is associated with a "pressure" and burning sensation.  Denies any fevers, rash, fatigue, chills.  Denies ever having history of shingles.  Did have history of chickenpox infection as a child.  She has a history of lower back pain, often with left leg sciatic type symptoms.  She has been taking diclofenac tablets, oxycodone, applying ice and heat without relief.  Patient also was started on a course of steroids by her PCP.  Patient reports having improvement of the left sciatic symptoms but still having the right burning and pressure.  She says that the pain is getting worse.  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  Reports having history of "degenerative disc" from an x-ray of her back years ago.  This is not in the chart and has not had recent imaging.  Denies any bowel or bladder dysfunction.  Patient says pain has impeded her ability to sleep and that she has to try multiple positions if comfortable.  The pain is worse with back flexion.  Patient is able to walk without issue.  Patient denies any numbness or weakness in her legs.  Denies any trauma to her back.  ROS: Per HPI  PMH: Smoking history reviewed.    Objective:  Physical Exam: BP 108/80   Ht 5\' 7"  (1.702 m)   Wt 139 lb (63 kg)   BMI 21.77 kg/m    Gen: NAD, resting comfortably Pulm: NWOB  Back Inspection: No rash, no deformities or bruising.  No instability. Palpation: Tender along right lower back down to sacrum at SI joint on right. ROM: Normal range of motion flexion extension, Strength: 5 of 5 strength in lower extremities throughout Stability: Back stable Special tests: Negative straight leg raise, normal FABER and FADIR testing Vascular studies: Not applicable Skin: warm, dry Neuro: grossly normal, moves  all extremities Psych: Normal affect and thought content  Bilateral hips: No deformity, instability. FROM with 5/5 strength. No tenderness to palpation. NVI distally. Negative logrolls.  Assessment/Plan:  Chronic right-sided low back pain without sciatica Patient with acute on chronic lower back pain.  Now with new neuropathic type symptoms on the right side.  Patient has been trying extensive conservative modalities including NSAIDs, opioids, icing and heat, steroids.  Patient has not had any imaging in several years.  Discussed issue with patient extensively. - Will pursue imaging, with MRI lumbar spine without contrast.  - Will start patient on nortriptyline 25 mg. - Patient to follow-up in 2 weeks either by call or clinic.   Lab Orders  No laboratory test(s) ordered today    Meds ordered this encounter  Medications  . nortriptyline (PAMELOR) 25 MG capsule    Sig: Take 1 capsule (25 mg total) by mouth at bedtime. Take 1/2 tablet at night for 3 days. If tolerating well, increase to 1 tablet at night.    Dispense:  30 capsule    Refill:  2      Marny Lowenstein, MD, MS FAMILY MEDICINE RESIDENT - PGY3 05/09/2019 9:45 AM

## 2019-05-10 NOTE — Telephone Encounter (Signed)
Paperwork completed and faxed.  °

## 2019-05-11 ENCOUNTER — Encounter: Payer: Self-pay | Admitting: Family

## 2019-05-14 ENCOUNTER — Other Ambulatory Visit: Payer: Self-pay | Admitting: Family Medicine

## 2019-05-14 ENCOUNTER — Telehealth: Payer: Self-pay

## 2019-05-14 MED ORDER — OXYCODONE-ACETAMINOPHEN 5-325 MG PO TABS: 1.0000 | ORAL_TABLET | Freq: Four times a day (QID) | ORAL | 0 refills | Status: DC | PRN

## 2019-05-14 NOTE — Telephone Encounter (Signed)
In the event I may need an epidural injection for pain this week. Dr. Barbaraann Barthel said Dr. Ron Agee from interventional radiology would be the one doing the injection in his office.So if you can I think I will have to have another referral to Dr. Ron Agee thx  Talked to patient and she doesn't know yet if she will have to see Dr Georgette Dover for sure. She is scheduled to have an MRI first to determent treatment.  She will call us back once she has all of the information and if she definitely needs the referral.

## 2019-05-14 NOTE — Addendum Note (Signed)
Addended by: Dene Gentry on: 05/14/2019 01:52 PM   Modules accepted: Orders

## 2019-05-16 ENCOUNTER — Other Ambulatory Visit: Payer: Self-pay

## 2019-05-16 ENCOUNTER — Ambulatory Visit
Admission: RE | Admit: 2019-05-16 | Discharge: 2019-05-16 | Disposition: A | Discharge status: Home / Self Care | Payer: No Typology Code available for payment source | Source: Ambulatory Visit | Attending: Family Medicine | Admitting: Family Medicine

## 2019-05-16 DIAGNOSIS — G8929 Other chronic pain: Secondary | ICD-10-CM

## 2019-05-16 DIAGNOSIS — M25562 Pain in left knee: Secondary | ICD-10-CM

## 2019-05-16 DIAGNOSIS — M545 Low back pain, unspecified: Secondary | ICD-10-CM

## 2019-05-17 ENCOUNTER — Ambulatory Visit (INDEPENDENT_AMBULATORY_CARE_PROVIDER_SITE_OTHER): Payer: No Typology Code available for payment source | Admitting: Family Medicine

## 2019-05-17 VITALS — BP 106/82 | Ht 67.0 in | Wt 139.0 lb

## 2019-05-17 DIAGNOSIS — M222X1 Patellofemoral disorders, right knee: Secondary | ICD-10-CM

## 2019-05-17 DIAGNOSIS — M545 Low back pain, unspecified: Secondary | ICD-10-CM

## 2019-05-17 DIAGNOSIS — M222X2 Patellofemoral disorders, left knee: Secondary | ICD-10-CM

## 2019-05-17 DIAGNOSIS — G8929 Other chronic pain: Secondary | ICD-10-CM

## 2019-05-17 NOTE — Progress Notes (Signed)
MRIs reviewed and discussed with patient.  Both are reassuring.  Lumbar spine with small disc bulge but no evidence impingement and consistent with normal MRI for age.  MRI of knee with thickened medial plica but otherwise normal.  Consistent with patellofemoral syndrome.  She will start physical therapy for both these issues.  We discussed return to work in 2 weeks for 4 hour shifts, then 8 hours after 2 weeks, then tentatively back to full duty after 2 weeks of 8 hour shifts.

## 2019-05-17 NOTE — Patient Instructions (Signed)
Start physical therapy and do home exercises on days you don't go to therapy. Diclofenac twice a day with food. Icing 15 minutes at a time 3-4 times a day as needed. Take the percocet sparingly - we do not refill this medicine. Topical capsaicin or biofreeze may be beneficial. Continue with knee braces when up and walking around. Follow up with me in 4 weeks for reevaluation (I can see you back in 2 weeks if needed as well if you're not doing well).

## 2019-05-18 ENCOUNTER — Other Ambulatory Visit: Payer: No Typology Code available for payment source

## 2019-05-20 ENCOUNTER — Other Ambulatory Visit: Payer: Self-pay | Admitting: Family Medicine

## 2019-05-21 ENCOUNTER — Other Ambulatory Visit: Payer: Self-pay

## 2019-05-21 ENCOUNTER — Encounter: Payer: Self-pay | Admitting: Physical Therapy

## 2019-05-21 ENCOUNTER — Ambulatory Visit: Payer: No Typology Code available for payment source | Attending: Family Medicine | Admitting: Physical Therapy

## 2019-05-21 DIAGNOSIS — M25561 Pain in right knee: Secondary | ICD-10-CM | POA: Insufficient documentation

## 2019-05-21 DIAGNOSIS — M25562 Pain in left knee: Secondary | ICD-10-CM | POA: Insufficient documentation

## 2019-05-21 DIAGNOSIS — R2689 Other abnormalities of gait and mobility: Secondary | ICD-10-CM | POA: Diagnosis present

## 2019-05-21 DIAGNOSIS — G8929 Other chronic pain: Secondary | ICD-10-CM | POA: Diagnosis present

## 2019-05-21 DIAGNOSIS — M545 Low back pain: Secondary | ICD-10-CM | POA: Insufficient documentation

## 2019-05-21 NOTE — Therapy (Signed)
[place], [place], [address] Phone: [phone]   Fax:  [phone]  Physical Therapy Evaluation  Patient Details  Name: Maria Hernandez MRN: [id number] Date of Birth: 13-Dec-1975 Referring Provider (PT): Karlton Lemon MD   Encounter Date: 05/21/2019  PT End of Session - 05/21/19 1206    Visit Number  1    Number of Visits  13    Date for PT Re-Evaluation  07/02/19    Authorization Type  [facility]    PT Start Time  1016    PT Stop Time  1104    PT Time Calculation (min)  48 min    Activity Tolerance  Patient tolerated treatment well    Behavior During Therapy  [facility] for tasks assessed/performed       Past Medical History:  Diagnosis Date  . Migraines     Past Surgical History:  Procedure Laterality Date  . AUGMENTATION MAMMAPLASTY    . BREAST ENHANCEMENT SURGERY Bilateral 2005  . INNER EAR SURGERY Left    Reports 2 prior ear surgeries for prosthesis. 3rd surgery coming up in January 2019  . MANDIBLE OSTEOTOMY  1995    There were no vitals filed for this visit.   Subjective Assessment - 05/21/19 1025    Subjective  pt reports the pain strted about 9 months ago with she was doing alot of walking around [facility] which she noted could have incresed the pain and reports she is a heavy heel strike walking. She reports she has been doing exercises for runner's knee which notes it hasn't really made difference, She reports pain seems to fluctuate depending on activity. She reports hx of low back pain that has been going on since she was 20 noting hx of DDD. 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    Limitations  Standing;Walking;Lifting    How long can you sit comfortably?  unlimited    How long can you stand comfortably?  15-20 min    How long can you walk comfortably?  15-20 min    Diagnostic tests  MRI    Patient Stated Goals  to decrease back and bil knee pain    Currently  in Pain?  Yes    Pain Score  --   L>R: L knee 7-8/10, R knee 4/10   Pain Location  Knee    Pain Orientation  Right;Left    Pain Descriptors / Indicators  Aching    Pain Type  Chronic pain    Pain Onset  More than a month ago    Pain Frequency  Intermittent    Aggravating Factors   prolonged sitting, standing/ walking, taylors position    Pain Relieving Factors  medication    Multiple Pain Sites  Yes    Pain Score  0    Pain Location  Back    Pain Orientation  Right    Pain Descriptors / Indicators  Aching    Pain Type  Chronic pain    Aggravating Factors   prolonged standing/walking         [facility] PT Assessment - 05/21/19 1031      Assessment   Medical Diagnosis  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   No    Is the patient reluctant to leave their home because of a fear of falling?   No      Home Film/video editor residence      Prior Function   Level of Independence  Independent    Vocation  Full time employment   nurse     Posture/Postural Control   Posture/Postural Control  Postural limitations    Postural Limitations  Forward head;Rounded Shoulders      ROM / Strength   AROM / PROM / Strength  AROM;Strength      AROM   Overall AROM Comments  knee is WFL bil    AROM Assessment Site  Knee;Lumbar    Right/Left Knee  Right;Left    Lumbar Flexion  100    Lumbar Extension  26    Lumbar - Right Side Bend  26    Lumbar - Left Side Bend  26      Strength   Strength Assessment Site  Knee;Hip    Right/Left Hip  Right;Left    Right  Hip Extension  4+/5    Right Hip ABduction  3+/5    Right Hip ADduction  4+/5    Left Hip Extension  4+/5    Left Hip ABduction  3+/5    Left Hip ADduction  4+/5    Right/Left Knee  Right;Left    Right Knee Flexion  4+/5    Right Knee Extension  4+/5    Left Knee Flexion  4+/5    Left Knee Extension  4+/5      Palpation   Patella mobility  lateral glide bil L>R of the patella noted with quad activation    SI assessment   TTP over the R PSIS and limited superior movement compared bil    Palpation comment  TTP along the medial aspect of the patella bil L>R. multiple trigger points in the vastus lateralis.      Special Tests    Special Tests  Sacrolliac Tests;Knee Special Tests    Sacroiliac Tests   Gaenslen's Test   (+) forward flexion test with limited superior movement   Knee Special tests   Step-up/Step Down Test      Sacral thrust    Findings  Positive    Side  Right      Gaenslen's test   Findings  Positive    Side   Right      Step-up/Step Down    Findings  Positive    Comments  bil L>R      Ambulation/Gait   Ambulation/Gait  Yes    Gait Pattern  Step-through pattern;Decreased stride length;Antalgic;Trendelenburg   bil genu valgum               Objective measurements completed on examination: See above findings.      [facility] Adult PT Treatment/Exercise - 05/21/19 1031      Manual Therapy   Manual Therapy  Taping    McConnell  L Latera>Medial  PT Education - 05/21/19 1215    Education Details  evaluaton findings, POC, goals. HEP with proper form/ rationale, anatomy of the areas involved    Person(s) Educated  Patient    Methods  Explanation;Verbal cues;Handout    Comprehension  Verbalized understanding;Verbal cues required       PT Short Term Goals - 05/21/19 1223      PT SHORT TERM GOAL #1   Title  pt to be I with initial HEP    Time  3    Period  Weeks    Status  New    Target Date  06/11/19        PT Long Term  Goals - 05/21/19 1223      PT LONG TERM GOAL #1   Title  pt to increase bil hip abductor/ extensor strengthening to >/= 4+/5 to promtoe hip/ knee stability    Time  6    Period  Weeks    Status  New    Target Date  07/02/19      PT LONG TERM GOAL #2   Title  pt to be able to stand/ walking >/= 60 min with no report of pain or limitations for endurnace required for work related activities.    Time  6    Period  Weeks    Status  New    Target Date  07/02/19      PT LONG TERM GOAL #3   Title  pt to demo proper posture and lifting mechanics to redcue and prevent low back and bil knee pain    Time  6    Period  Weeks    Status  New    Target Date  07/02/19      PT LONG TERM GOAL #4   Title  pt to be able to perform work out routine with no report of pain/ limitation for personal goals.    Time  6    Period  Weeks    Status  New    Target Date  07/02/19      PT LONG TERM GOAL #5   Title  pt to be I with all HEP given as of last visit to maintain and progress current level of function    Time  6    Period  Weeks    Status  New    Target Date  07/02/19             Plan - 05/21/19 1215    Clinical Impression Statement  pt presents to OPPT with CC of bil knee pain L>R and fluctuating chronic LBP. She has functional knee ROM and with weakness noted in bil hips. special testing for the knees is consistent with Dx of PFPS, and special testing for the low back suggest high probability of SIJ involvement on the R. She would benefit from physical therapy to decrease low back pain, improve hip strength and promote patellar stability, and maximize her function by addressing the deficits listed.    Personal Factors and Comorbidities  --    Stability/Clinical Decision Making  Stable/Uncomplicated    Clinical Decision Making  Moderate    Rehab Potential  Good    PT Frequency  2x / week    PT Duration  6 weeks    PT Treatment/Interventions  ADLs/Self Care Home  Management;Cryotherapy;Electrical Stimulation;Iontophoresis 20m/ml Dexamethasone;Moist Heat;Traction;Ultrasound;Gait training;Stair training;Therapeutic activities;Therapeutic exercise;Balance training;Neuromuscular re-education;Manual techniques;Passive range of motion;Dry needling;Taping    PT Next Visit Plan  review/ update HEP, how was McConnel taping, VMO activation techniqus, vastus lateralis STW: potential DN? hamstring stretching on the RLE/ hp flexor METs, core / hip strengthening    PT Home Exercise Plan  Access code: [email or web address] - LTR, hamstring stretching, hip flexor MET, ball squeeze VMO activation    Consulted and Agree with Plan of Care  Patient       Patient will benefit from skilled therapeutic intervention in order to improve the following deficits and impairments:  Pain, Decreased activity tolerance, Increased muscle spasms, Improper body mechanics, Decreased strength, Postural dysfunction, Decreased endurance, Decreased range of motion, Decreased balance  Visit Diagnosis: Chronic pain of right knee  Chronic pain of left knee  Chronic right-sided low back pain without sciatica  Other abnormalities of gait and mobility     Problem List Patient Active Problem List   Diagnosis Date Noted  . Chronic right-sided low back pain without sciatica 05/09/2019  . Migraines 06/07/2017  . Hearing loss 06/07/2017   Starr Lake PT, DPT, LAT, ATC  05/21/19  12:32 PM      [facility] [address] [place], [place], [address] Phone: [phone]   Fax:  [phone]  Name: Maria Hernandez MRN: [id number] Date of Birth: Jan 18, 1976

## 2019-05-21 NOTE — Therapy (Deleted)
[place], [place], [address] Phone: [phone]   Fax:  [phone]  Physical Therapy Treatment  Patient Details  Name: Maria Hernandez MRN: [id number] Date of Birth: 1976-06-23 Referring Provider (PT): Karlton Lemon MD   Encounter Date: 05/21/2019  PT End of Session - 05/21/19 1206    Visit Number  1    Number of Visits  13    Date for PT Re-Evaluation  07/02/19    Authorization Type  [facility]    PT Start Time  1016    PT Stop Time  1104    PT Time Calculation (min)  48 min    Activity Tolerance  Patient tolerated treatment well    Behavior During Therapy  [facility] for tasks assessed/performed       Past Medical History:  Diagnosis Date  . Migraines     Past Surgical History:  Procedure Laterality Date  . AUGMENTATION MAMMAPLASTY    . BREAST ENHANCEMENT SURGERY Bilateral 2005  . INNER EAR SURGERY Left    Reports 2 prior ear surgeries for prosthesis. 3rd surgery coming up in January 2019  . MANDIBLE OSTEOTOMY  1995    There were no vitals filed for this visit.  Subjective Assessment - 05/21/19 1025    Subjective  pt reports the pain strted about 9 months ago with she was doing alot of walking around [facility] which she noted could have incresed the pain and reports she is a heavy heel strike walking. She reports she has been doing exercises for runner's knee which notes it hasn't really made difference, She reports pain seems to fluctuate depending on activity. She reports hx of low back pain that has been going on since she was 20 noting hx of DDD. 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    Limitations  Standing;Walking;Lifting    How long can you sit comfortably?  unlimited    How long can you stand comfortably?  15-20 min    How long can you walk comfortably?  15-20 min    Diagnostic tests  MRI    Patient Stated Goals  to decrease back and bil knee pain    Currently in  Pain?  Yes    Pain Score  --   L>R: L knee 7-8/10, R knee 4/10   Pain Location  Knee    Pain Orientation  Right;Left    Pain Descriptors / Indicators  Aching    Pain Type  Chronic pain    Pain Onset  More than a month ago    Pain Frequency  Intermittent    Aggravating Factors   prolonged sitting, standing/ walking, taylors position    Pain Relieving Factors  medication    Multiple Pain Sites  Yes    Pain Score  0    Pain Location  Back    Pain Orientation  Right    Pain Descriptors / Indicators  Aching    Pain Type  Chronic pain    Aggravating Factors   prolonged standing/walking         [facility] PT Assessment - 05/21/19 1031      Assessment   Medical Diagnosis  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   No    Is the patient reluctant to leave their home because of a fear of falling?   No      Home Film/video editor residence      Prior Function   Level of Independence  Independent    Vocation  Full time employment   nurse     Posture/Postural Control   Posture/Postural Control  Postural limitations    Postural Limitations  Forward head;Rounded Shoulders      ROM / Strength   AROM / PROM / Strength  AROM;Strength      AROM   Overall AROM Comments  knee is WFL bil    AROM Assessment Site  Knee;Lumbar    Right/Left Knee  Right;Left    Lumbar Flexion  100    Lumbar Extension  26    Lumbar - Right Side Bend  26    Lumbar - Left Side Bend  26      Strength   Strength Assessment Site  Knee;Hip    Right/Left Hip  Right;Left    Right  Hip Extension  4+/5    Right Hip ABduction  3+/5    Right Hip ADduction  4+/5    Left Hip Extension  4+/5    Left Hip ABduction  3+/5    Left Hip ADduction  4+/5    Right/Left Knee  Right;Left    Right Knee Flexion  4+/5    Right Knee Extension  4+/5    Left Knee Flexion  4+/5    Left Knee Extension  4+/5      Palpation   Patella mobility  lateral glide bil L>R of the patella noted with quad activation    SI assessment   TTP over the R PSIS and limited superior movement compared bil    Palpation comment  TTP along the medial aspect of the patella bil L>R. multiple trigger points in the vastus lateralis.      Special Tests    Special Tests  Sacrolliac Tests;Knee Special Tests    Sacroiliac Tests   Gaenslen's Test   (+) forward flexion test with limited superior movement   Knee Special tests   Step-up/Step Down Test      Sacral thrust    Findings  Positive    Side  Right      Gaenslen's test   Findings  Positive    Side   Right      Step-up/Step Down    Findings  Positive    Comments  bil L>R      Ambulation/Gait   Ambulation/Gait  Yes    Gait Pattern  Step-through pattern;Decreased stride length;Antalgic;Trendelenburg   bil genu valgum                  [facility] Adult PT Treatment/Exercise - 05/21/19 1031      Manual Therapy   Manual Therapy  Taping    Minus Breeding Latera>Medial             PT Education - 05/21/19 1215  Education Details  evaluaton findings, POC, goals. HEP with proper form/ rationale, anatomy of the areas involved    Person(s) Educated  Patient    Methods  Explanation;Verbal cues;Handout    Comprehension  Verbalized understanding;Verbal cues required       PT Short Term Goals - 05/21/19 1223      PT SHORT TERM GOAL #1   Title  pt to be I with initial HEP    Time  3    Period  Weeks    Status  New    Target Date  06/11/19        PT Long Term Goals - 05/21/19 1223      PT LONG TERM GOAL #1   Title  pt to increase  bil hip abductor/ extensor strengthening to >/= 4+/5 to promtoe hip/ knee stability    Time  6    Period  Weeks    Status  New    Target Date  07/02/19      PT LONG TERM GOAL #2   Title  pt to be able to stand/ walking >/= 60 min with no report of pain or limitations for endurnace required for work related activities.    Time  6    Period  Weeks    Status  New    Target Date  07/02/19      PT LONG TERM GOAL #3   Title  pt to demo proper posture and lifting mechanics to redcue and prevent low back and bil knee pain    Time  6    Period  Weeks    Status  New    Target Date  07/02/19      PT LONG TERM GOAL #4   Title  pt to be able to perform work out routine with no report of pain/ limitation for personal goals.    Time  6    Period  Weeks    Status  New    Target Date  07/02/19      PT LONG TERM GOAL #5   Title  pt to be I with all HEP given as of last visit to maintain and progress current level of function    Time  6    Period  Weeks    Status  New    Target Date  07/02/19            Plan - 05/21/19 1215    Clinical Impression Statement  pt presents to OPPT with CC of bil knee pain L>R and fluctuating chronic LBP. She has functional knee ROM and with weakness noted in bil hips. special testing for the knees is consistent with Dx of PFPS, and special testing for the low back suggest high probability of SIJ involvement on the R. She would benefit from physical therapy to decrease low back pain, improve hip strength and promote patellar stability, and maximize her function by addressing the deficits listed.    Personal Factors and Comorbidities  --    Stability/Clinical Decision Making  Stable/Uncomplicated    Clinical Decision Making  Moderate    Rehab Potential  Good    PT Frequency  2x / week    PT Duration  6 weeks    PT Treatment/Interventions  ADLs/Self Care Home Management;Cryotherapy;Electrical Stimulation;Iontophoresis 42m/ml Dexamethasone;Moist  Heat;Traction;Ultrasound;Gait training;Stair training;Therapeutic activities;Therapeutic exercise;Balance training;Neuromuscular re-education;Manual techniques;Passive range of motion;Dry needling;Taping    PT Next Visit Plan  review/ update HEP, how was McConnel taping, VMO activation  techniqus, vastus lateralis STW: potential DN? hamstring stretching on the RLE/ hp flexor METs, core / hip strengthening    PT Home Exercise Plan  Access code: [email or web address] - LTR, hamstring stretching, hip flexor MET, ball squeeze VMO activation    Consulted and Agree with Plan of Care  Patient       Patient will benefit from skilled therapeutic intervention in order to improve the following deficits and impairments:  Pain, Decreased activity tolerance, Increased muscle spasms, Improper body mechanics, Decreased strength, Postural dysfunction, Decreased endurance, Decreased range of motion, Decreased balance  Visit Diagnosis: Chronic pain of right knee  Chronic pain of left knee  Chronic right-sided low back pain without sciatica  Other abnormalities of gait and mobility     Problem List Patient Active Problem List   Diagnosis Date Noted  . Chronic right-sided low back pain without sciatica 05/09/2019  . Migraines 06/07/2017  . Hearing loss 06/07/2017    Maria Hernandez 05/21/2019, 12:31 PM  [facility] [address] [place], [place], [address] Phone: [phone]   Fax:  [phone]  Name: Maria Hernandez MRN: [id number] Date of Birth: Jul 20, 1975

## 2019-05-22 ENCOUNTER — Ambulatory Visit: Payer: No Typology Code available for payment source | Admitting: Physical Therapy

## 2019-05-22 ENCOUNTER — Encounter: Payer: Self-pay | Admitting: Physical Therapy

## 2019-05-22 DIAGNOSIS — R2689 Other abnormalities of gait and mobility: Secondary | ICD-10-CM

## 2019-05-22 DIAGNOSIS — M25562 Pain in left knee: Secondary | ICD-10-CM

## 2019-05-22 DIAGNOSIS — G8929 Other chronic pain: Secondary | ICD-10-CM

## 2019-05-22 DIAGNOSIS — M25561 Pain in right knee: Secondary | ICD-10-CM | POA: Diagnosis not present

## 2019-05-22 NOTE — Therapy (Signed)
[place], [place], [address] Phone: [phone]   Fax:  [phone]  Physical Therapy Treatment  Patient Details  Name: Maria Hernandez MRN: [id number] Date of Birth: 07/12/1975 Referring Provider (PT): Karlton Lemon MD   Encounter Date: 05/22/2019  PT End of Session - 05/22/19 1101    Visit Number  2    Number of Visits  13    Date for PT Re-Evaluation  07/02/19    Authorization Type  [facility]    PT Start Time  1101    PT Stop Time  1144    PT Time Calculation (min)  43 min    Activity Tolerance  Patient tolerated treatment well    Behavior During Therapy  [facility] for tasks assessed/performed       Past Medical History:  Diagnosis Date  . Migraines     Past Surgical History:  Procedure Laterality Date  . AUGMENTATION MAMMAPLASTY    . BREAST ENHANCEMENT SURGERY Bilateral 2005  . INNER EAR SURGERY Left    Reports 2 prior ear surgeries for prosthesis. 3rd surgery coming up in January 2019  . MANDIBLE OSTEOTOMY  1995    There were no vitals filed for this visit.  Subjective Assessment - 05/22/19 1102    Subjective  "I have been working on the hamstring stretching and the exercises at home. I am doing the exercise at home"    Currently in Pain?  Yes    Pain Score  5     Pain Orientation  Right;Left    Pain Onset  More than a month ago    Pain Frequency  Intermittent    Pain Score  0    Pain Location  Back         [facility] PT Assessment - 05/22/19 0001      Assessment   Medical Diagnosis  Low back pain and bil knee PFPS    Referring Provider (PT)  Karlton Lemon MD                   [facility] Adult PT Treatment/Exercise - 05/22/19 0001      Exercises   Exercises  Knee/Hip;Lumbar      Lumbar Exercises: Stretches   Active Hamstring Stretch  2 reps;30 seconds   contract/ relax    Lower Trunk Rotation Limitations  2 x 10 bil    cues for proper form     Lumbar Exercises: Supine   Dead Bug  10 reps   holding 10 secs     Lumbar Exercises: Quadruped   Madcat/Old Horse  10 reps      Knee/Hip Exercises: Supine   Straight Leg Raises  2 sets;15 reps      Knee/Hip Exercises: Sidelying   Hip ABduction  10 reps;3 sets;Both;Strengthening   1 set regular, 1 set CW/ 1 set CCW     Manual Therapy   Manual Therapy  Joint mobilization    Joint Mobilization  LAD grade V              PT Education - 05/22/19 1137    Education Details  reviewed HEP and updated for cat/cow exercise    Person(s) Educated  Patient    Methods  Explanation;Verbal cues;Handout    Comprehension  Verbalized understanding;Verbal cues required       PT Short Term Goals - 05/21/19 1223      PT SHORT TERM GOAL #1   Title  pt  to be I with initial HEP    Time  3    Period  Weeks    Status  New    Target Date  06/11/19        PT Long Term Goals - 05/21/19 1223      PT LONG TERM GOAL #1   Title  pt to increase bil hip abductor/ extensor strengthening to >/= 4+/5 to promtoe hip/ knee stability    Time  6    Period  Weeks    Status  New    Target Date  07/02/19      PT LONG TERM GOAL #2   Title  pt to be able to stand/ walking >/= 60 min with no report of pain or limitations for endurnace required for work related activities.    Time  6    Period  Weeks    Status  New    Target Date  07/02/19      PT LONG TERM GOAL #3   Title  pt to demo proper posture and lifting mechanics to redcue and prevent low back and bil knee pain    Time  6    Period  Weeks    Status  New    Target Date  07/02/19      PT LONG TERM GOAL #4   Title  pt to be able to perform work out routine with no report of pain/ limitation for personal goals.    Time  6    Period  Weeks    Status  New    Target Date  07/02/19      PT LONG TERM GOAL #5   Title  pt to be I with all HEP given as of last visit to maintain and progress current level of function    Time  6    Period  Weeks    Status  New    Target  Date  07/02/19            Plan - 05/22/19 1148    Clinical Impression Statement  pt arrives to PT today noting significant improvement in back pain and knee pain. She reports doing the exercises despite her evaluation being yesterday. continued STW to calm down vastus lateralis and worked on [employer] activation techniques, and continued working on SIJ on the R with hamstring stretching and core activation. pt noted significant improvement in pain since coming in.    PT Treatment/Interventions  ADLs/Self Care Home Management;Cryotherapy;Electrical Stimulation;Iontophoresis 85m/ml Dexamethasone;Moist Heat;Traction;Ultrasound;Gait training;Stair training;Therapeutic activities;Therapeutic exercise;Balance training;Neuromuscular re-education;Manual techniques;Passive range of motion;Dry needling;Taping    PT Next Visit Plan  review/ update HEP, how was McConnel taping, VMO activation techniqus, vastus lateralis STW: potential DN? hamstring stretching on the RLE/ hp flexor METs, core / hip strengthening    PT Home Exercise Plan  Access code: [email or web address] - LTR, hamstring stretching, hip flexor MET, ball squeeze VMO activation, cat/cow    Consulted and Agree with Plan of Care  Patient       Patient will benefit from skilled therapeutic intervention in order to improve the following deficits and impairments:  Pain, Decreased activity tolerance, Increased muscle spasms, Improper body mechanics, Decreased strength, Postural dysfunction, Decreased endurance, Decreased range of motion, Decreased balance  Visit Diagnosis: Chronic pain of right knee  Chronic pain of left knee  Chronic right-sided low back pain without sciatica  Other abnormalities of gait and mobility     Problem List Patient Active Problem List  Diagnosis Date Noted  . Chronic right-sided low back pain without sciatica 05/09/2019  . Migraines 06/07/2017  . Hearing loss 06/07/2017   Starr Lake PT, DPT, LAT, ATC  05/22/19   11:52 AM      [facility] [address] [place], [place], [address] Phone: [phone]   Fax:  [phone]  Name: Maria Hernandez MRN: [id number] Date of Birth: 04-May-1976

## 2019-05-23 ENCOUNTER — Other Ambulatory Visit: Payer: No Typology Code available for payment source

## 2019-05-25 ENCOUNTER — Encounter: Payer: Self-pay | Admitting: Physical Therapy

## 2019-05-29 ENCOUNTER — Encounter: Payer: Self-pay | Admitting: Physical Therapy

## 2019-06-04 ENCOUNTER — Ambulatory Visit: Payer: No Typology Code available for payment source | Admitting: Physical Therapy

## 2019-06-04 ENCOUNTER — Other Ambulatory Visit: Payer: Self-pay

## 2019-06-04 ENCOUNTER — Encounter: Payer: Self-pay | Admitting: Physical Therapy

## 2019-06-04 DIAGNOSIS — G8929 Other chronic pain: Secondary | ICD-10-CM

## 2019-06-04 DIAGNOSIS — M25561 Pain in right knee: Secondary | ICD-10-CM | POA: Diagnosis not present

## 2019-06-04 DIAGNOSIS — R2689 Other abnormalities of gait and mobility: Secondary | ICD-10-CM

## 2019-06-04 DIAGNOSIS — M25562 Pain in left knee: Secondary | ICD-10-CM

## 2019-06-04 NOTE — Therapy (Signed)
[place], [place], [address] Phone: [phone]   Fax:  [phone]  Physical Therapy Treatment  Patient Details  Name: Maria Hernandez MRN: [id number] Date of Birth: 1976/06/16 Referring Provider (PT): Karlton Lemon MD   Encounter Date: 06/04/2019  PT End of Session - 06/04/19 1121    Visit Number  3    Number of Visits  13    Date for PT Re-Evaluation  07/02/19    Authorization Type  [facility]    PT Start Time  1017    PT Stop Time  1055    PT Time Calculation (min)  38 min       Past Medical History:  Diagnosis Date  . Migraines     Past Surgical History:  Procedure Laterality Date  . AUGMENTATION MAMMAPLASTY    . BREAST ENHANCEMENT SURGERY Bilateral 2005  . INNER EAR SURGERY Left    Reports 2 prior ear surgeries for prosthesis. 3rd surgery coming up in January 2019  . MANDIBLE OSTEOTOMY  1995    There were no vitals filed for this visit.  Subjective Assessment - 06/04/19 1019    Subjective  I am only doing the exercises Vania Rea gave me. I am going without the sleeve more often.    Currently in Pain?  Yes    Pain Score  7     Pain Location  Knee    Pain Orientation  Right;Left    Pain Descriptors / Indicators  Aching    Pain Type  Chronic pain                       [facility] Adult PT Treatment/Exercise - 06/04/19 0001      Lumbar Exercises: Stretches   Active Hamstring Stretch  2 reps;30 seconds   contract/ relax    Lower Trunk Rotation Limitations  2 x 10 bil    cues for proper form     Lumbar Exercises: Supine   Other Supine Lumbar Exercises  alternating isometric hip flexion  10 sec each       Lumbar Exercises: Quadruped   Madcat/Old Horse  10 reps    Madcat/Old Horse Limitations  cues to keep shoulders depressed       Knee/Hip Exercises: Seated   Long Arc Quad  10 reps    Long Arc Quad Limitations  with ball squeeze       Knee/Hip Exercises: Supine   Quad  Sets  20 reps    Quad Sets Limitations  with ball squeeze     Short Arc [employer]  20 reps    Short Arc Quad Sets Limitations  alternating with ball squeeze       Manual Therapy   Manual Therapy  Taping    Minus Breeding Latera>Medial             PT Education - 06/04/19 1040    Education Details  HEP    Person(s) Educated  Patient    Methods  Explanation;Handout    Comprehension  Verbalized understanding       PT Short Term Goals - 05/21/19 1223      PT SHORT TERM GOAL #1   Title  pt to be I with initial HEP    Time  3    Period  Weeks    Status  New    Target Date  06/11/19        PT Long  Term Goals - 05/21/19 1223      PT LONG TERM GOAL #1   Title  pt to increase bil hip abductor/ extensor strengthening to >/= 4+/5 to promtoe hip/ knee stability    Time  6    Period  Weeks    Status  New    Target Date  07/02/19      PT LONG TERM GOAL #2   Title  pt to be able to stand/ walking >/= 60 min with no report of pain or limitations for endurnace required for work related activities.    Time  6    Period  Weeks    Status  New    Target Date  07/02/19      PT LONG TERM GOAL #3   Title  pt to demo proper posture and lifting mechanics to redcue and prevent low back and bil knee pain    Time  6    Period  Weeks    Status  New    Target Date  07/02/19      PT LONG TERM GOAL #4   Title  pt to be able to perform work out routine with no report of pain/ limitation for personal goals.    Time  6    Period  Weeks    Status  New    Target Date  07/02/19      PT LONG TERM GOAL #5   Title  pt to be I with all HEP given as of last visit to maintain and progress current level of function    Time  6    Period  Weeks    Status  New    Target Date  07/02/19            Plan - 06/04/19 1124    Clinical Impression Statement  Pt arrives reporting 7/10 left knee pain, no right knee pain and no back pain. She has been going without her sleeve when not working. She  is working the next two days so applied mcconnel tracking tape for her to wear tomorrow while working. Then she will try just the sleeve on her next shift and compare the benfit of each. Reviewed HEP today and she needed cues for form and hold times. Progressed with VMO activation and updated HEP. Asked her to stop if increased pain.    PT Next Visit Plan  review/ update HEP, how was McConnel taping at work verses sleeve at work?, VMO activation techniqus, vastus lateralis STW: potential DN? hamstring stretching on the RLE/ hp flexor METs, core / hip strengthening    PT Home Exercise Plan  Access code: [email or web address] - LTR, hamstring stretching, hip flexor MET, ball squeeze VMO activation, cat/cow, added ball squeeze SAQ and LAQ       Patient will benefit from skilled therapeutic intervention in order to improve the following deficits and impairments:  Pain, Decreased activity tolerance, Increased muscle spasms, Improper body mechanics, Decreased strength, Postural dysfunction, Decreased endurance, Decreased range of motion, Decreased balance  Visit Diagnosis: Chronic pain of right knee  Chronic pain of left knee  Chronic right-sided low back pain without sciatica  Other abnormalities of gait and mobility     Problem List Patient Active Problem List   Diagnosis Date Noted  . Chronic right-sided low back pain without sciatica 05/09/2019  . Migraines 06/07/2017  . Hearing loss 06/07/2017    Dorene Ar, PTA 06/04/2019, 11:30 AM  [facility]  [place], [place], [address] Phone: [phone]   Fax:  [phone]  Name: ARIANNE KLINGE MRN: [id number] Date of Birth: 07-Apr-1976

## 2019-06-06 ENCOUNTER — Encounter: Payer: No Typology Code available for payment source | Admitting: Physical Therapy

## 2019-06-07 ENCOUNTER — Encounter: Payer: Self-pay | Admitting: Physical Therapy

## 2019-06-07 ENCOUNTER — Other Ambulatory Visit: Payer: Self-pay

## 2019-06-07 ENCOUNTER — Ambulatory Visit: Payer: No Typology Code available for payment source | Attending: Family Medicine | Admitting: Physical Therapy

## 2019-06-07 DIAGNOSIS — R2689 Other abnormalities of gait and mobility: Secondary | ICD-10-CM | POA: Diagnosis present

## 2019-06-07 DIAGNOSIS — M25561 Pain in right knee: Secondary | ICD-10-CM | POA: Insufficient documentation

## 2019-06-07 DIAGNOSIS — G8929 Other chronic pain: Secondary | ICD-10-CM | POA: Insufficient documentation

## 2019-06-07 DIAGNOSIS — M545 Low back pain, unspecified: Secondary | ICD-10-CM

## 2019-06-07 DIAGNOSIS — M25562 Pain in left knee: Secondary | ICD-10-CM | POA: Insufficient documentation

## 2019-06-07 NOTE — Therapy (Signed)
[place], [place], [address] Phone: [phone]   Fax:  [phone]  Physical Therapy Treatment  Patient Details  Name: Maria Hernandez MRN: [id number] Date of Birth: [birth year] Referring Provider (PT): Karlton Lemon MD   Encounter Date: 06/07/2019  PT End of Session - 06/07/19 1100    Visit Number  4    Number of Visits  13    Date for PT Re-Evaluation  07/02/19    Authorization Type  [facility]    PT Start Time  1100    PT Stop Time  1145    PT Time Calculation (min)  45 min    Activity Tolerance  Patient tolerated treatment well    Behavior During Therapy  [facility] for tasks assessed/performed       Past Medical History:  Diagnosis Date  . Migraines     Past Surgical History:  Procedure Laterality Date  . AUGMENTATION MAMMAPLASTY    . BREAST ENHANCEMENT SURGERY Bilateral 2005  . INNER EAR SURGERY Left    Reports 2 prior ear surgeries for prosthesis. 3rd surgery coming up in January 2019  . MANDIBLE OSTEOTOMY  1995    There were no vitals filed for this visit.  Subjective Assessment - 06/07/19 1101    Subjective  "I did try the tape for one shift and one shift with the knee sleeve and it was burning/ sore"                       [facility] Adult PT Treatment/Exercise - 06/07/19 0001      Knee/Hip Exercises: Sidelying   Hip ADduction  Strengthening;Left;2 sets;20 reps    Other Sidelying Knee/Hip Exercises  Hip ER 2 x 20 LLE only in left sidelying      Manual Therapy   Manual Therapy  Soft tissue mobilization    Manual therapy comments  skilled palpation and monitoring of pt throughout TPDN    Joint Mobilization  low load long duration medial patellar glide    Soft tissue mobilization  IASTM along vastus lateralis    McConnell  L Latera>Medial       Trigger Point Dry Needling - 06/07/19 0001    Consent Given?  Yes    Education Handout Provided  Yes    Muscles Treated Lower  Quadrant  Vastus lateralis    Vastus lateralis Response  Twitch response elicited;Palpable increased muscle length   L only          PT Education - 06/07/19 1115    Education Details  reviewed muscle anatomy and referral patterns. What TPDN is, beneftis and what to expect. Where to find McConnel tape, and updated HEP for sidelying piriformis strengthening.    Person(s) Educated  Patient    Methods  Explanation;Verbal cues;Handout    Comprehension  Verbalized understanding;Verbal cues required       PT Short Term Goals - 05/21/19 1223      PT SHORT TERM GOAL #1   Title  pt to be I with initial HEP    Time  3    Period  Weeks    Status  New    Target Date  06/11/19        PT Long Term Goals - 05/21/19 1223      PT LONG TERM GOAL #1   Title  pt to increase bil hip abductor/ extensor strengthening to >/= 4+/5 to promtoe hip/ knee stability  Time  6    Period  Weeks    Status  New    Target Date  07/02/19      PT LONG TERM GOAL #2   Title  pt to be able to stand/ walking >/= 60 min with no report of pain or limitations for endurnace required for work related activities.    Time  6    Period  Weeks    Status  New    Target Date  07/02/19      PT LONG TERM GOAL #3   Title  pt to demo proper posture and lifting mechanics to redcue and prevent low back and bil knee pain    Time  6    Period  Weeks    Status  New    Target Date  07/02/19      PT LONG TERM GOAL #4   Title  pt to be able to perform work out routine with no report of pain/ limitation for personal goals.    Time  6    Period  Weeks    Status  New    Target Date  07/02/19      PT LONG TERM GOAL #5   Title  pt to be I with all HEP given as of last visit to maintain and progress current level of function    Time  6    Period  Weeks    Status  New    Target Date  07/02/19            Plan - 06/07/19 1145    Clinical Impression Statement  pt reports consistency with her HEP and has stopped  doing additional exercises and focusing on what was provided in PT. educated and consent was provided for TPDN focusing on vastus lateralis combined with IASTM techniques, contiued reviewing how to apply McConnel tape. continued working hip / knee strengthening which she noted relief of "burning" and pain.    PT Treatment/Interventions  ADLs/Self Care Home Management;Cryotherapy;Electrical Stimulation;Iontophoresis 35m/ml Dexamethasone;Moist Heat;Traction;Ultrasound;Gait training;Stair training;Therapeutic activities;Therapeutic exercise;Balance training;Neuromuscular re-education;Manual techniques;Passive range of motion;Dry needling;Taping    PT Next Visit Plan  update HEP, how was McConnel taping PRN, activation techniqus, vastus lateralis , core / hip strengthening, try reformer for hip/ knee strengthning, resonse to TPDN    PT Home Exercise Plan  Access code: [email or web address] - LTR, hamstring stretching, hip flexor MET, ball squeeze VMO activation, cat/cow, added ball squeeze SAQ and LAQ, sidelying hip external rotation.    Consulted and Agree with Plan of Care  Patient       Patient will benefit from skilled therapeutic intervention in order to improve the following deficits and impairments:     Visit Diagnosis: Chronic pain of right knee  Chronic pain of left knee  Chronic right-sided low back pain without sciatica  Other abnormalities of gait and mobility     Problem List Patient Active Problem List   Diagnosis Date Noted  . Chronic right-sided low back pain without sciatica 05/09/2019  . Migraines 06/07/2017  . Hearing loss 06/07/2017   KStarr LakePT, DPT, LAT, ATC  06/07/19  11:50 AM      [facility] [place] [address]Phone: [phone]  Fax:  [phone] Name: Maria PINELAMRN: [id number] of Birth: 704/22/1977

## 2019-06-08 ENCOUNTER — Encounter: Payer: Self-pay | Admitting: Family

## 2019-06-09 ENCOUNTER — Encounter: Payer: Self-pay | Admitting: Family

## 2019-06-11 ENCOUNTER — Other Ambulatory Visit: Payer: Self-pay

## 2019-06-11 ENCOUNTER — Encounter: Payer: Self-pay | Admitting: Family

## 2019-06-11 ENCOUNTER — Encounter: Payer: Self-pay | Admitting: Physical Therapy

## 2019-06-11 ENCOUNTER — Ambulatory Visit: Payer: No Typology Code available for payment source | Admitting: Physical Therapy

## 2019-06-11 DIAGNOSIS — M545 Low back pain, unspecified: Secondary | ICD-10-CM

## 2019-06-11 DIAGNOSIS — R2689 Other abnormalities of gait and mobility: Secondary | ICD-10-CM

## 2019-06-11 DIAGNOSIS — M25561 Pain in right knee: Secondary | ICD-10-CM | POA: Diagnosis not present

## 2019-06-11 DIAGNOSIS — G8929 Other chronic pain: Secondary | ICD-10-CM

## 2019-06-11 DIAGNOSIS — M25562 Pain in left knee: Secondary | ICD-10-CM

## 2019-06-11 NOTE — Telephone Encounter (Signed)
Spoke with patient and offered her a visit with one of our providers, she reports she is stable at this point and symptoms for GERD resolving. She will call back if symptoms come back.  

## 2019-06-11 NOTE — Therapy (Signed)
[facility] [address] [place], [place], [address] Phone: [phone]   Fax:  [phone]  Physical Therapy Treatment  Patient Details  Name: Maria Hernandez MRN: [id number] Date of Birth: 22-Aug-1975 Referring Provider (PT): Norton Blizzard MD   Encounter Date: 06/11/2019  PT End of Session - 06/11/19 1107    Visit Number  5    Number of Visits  13    Date for PT Re-Evaluation  07/02/19    Authorization Type  [facility]    PT Start Time  1102    PT Stop Time  1140    PT Time Calculation (min)  38 min       Past Medical History:  Diagnosis Date  . Migraines     Past Surgical History:  Procedure Laterality Date  . AUGMENTATION MAMMAPLASTY    . BREAST ENHANCEMENT SURGERY Bilateral 2005  . INNER EAR SURGERY Left    Reports 2 prior ear surgeries for prosthesis. 3rd surgery coming up in January 2019  . MANDIBLE OSTEOTOMY  1995    There were no vitals filed for this visit.  Subjective Assessment - 06/11/19 1105    Subjective  Tape will be here tomorrow or the next day. Worked 5 hours yesterday and was sore and achey afterward.    Currently in Pain?  Yes    Pain Score  4     Pain Location  Knee    Pain Orientation  Left    Pain Type  Chronic pain    Pain Score  0    Pain Location  Back                       [facility] Adult PT Treatment/Exercise - 06/11/19 0001      Lumbar Exercises: Stretches   Quadruped Mid Back Stretch Limitations  --      Knee/Hip Exercises: Seated   Long Arc Quad  Right;Left;10 reps    Long Arc Quad Limitations  with ball squeeze     Sit to [employer]  10 reps   cues for alignment, pain in left knee     Knee/Hip Exercises: Supine   Quad Sets  20 reps    [employer] Limitations  with ball squeeze    Short Arc [employer]  20 reps    Short Arc [employer] Limitations  with ball       Knee/Hip Exercises: Sidelying   Hip ABduction  20 reps    Hip ABduction Limitations  tapping forward and  back    Other Sidelying Knee/Hip Exercises  Hip ER 2 x 20 LLE only in left sidelying      Manual Therapy   Joint Mobilization  low load long duration medial patellar glide               PT Short Term Goals - 05/21/19 1223      PT SHORT TERM GOAL #1   Title  pt to be I with initial HEP    Time  3    Period  Weeks    Status  New    Target Date  06/11/19        PT Long Term Goals - 05/21/19 1223      PT LONG TERM GOAL #1   Title  pt to increase bil hip abductor/ extensor strengthening to >/= 4+/5 to promtoe hip/ knee stability    Time  6    Period  Weeks    Status  New    Target Date  07/02/19      PT LONG TERM GOAL #2   Title  pt to be able to stand/ walking >/= 60 min with no report of pain or limitations for endurnace required for work related activities.    Time  6    Period  Weeks    Status  New    Target Date  07/02/19      PT LONG TERM GOAL #3   Title  pt to demo proper posture and lifting mechanics to redcue and prevent low back and bil knee pain    Time  6    Period  Weeks    Status  New    Target Date  07/02/19      PT LONG TERM GOAL #4   Title  pt to be able to perform work out routine with no report of pain/ limitation for personal goals.    Time  6    Period  Weeks    Status  New    Target Date  07/02/19      PT LONG TERM GOAL #5   Title  pt to be I with all HEP given as of last visit to maintain and progress current level of function    Time  6    Period  Weeks    Status  New    Target Date  07/02/19            Plan - 06/11/19 1135    Clinical Impression Statement  Pt reports no notable improvement after TPDN. She continues with soreness and achiness post 4-5 hour work shifts, moslty Left knee. Overall she is feeling better. Tape was given for her to apply tomorrow. Session focused on hip strength and VMO activation. No increased pain.       Patient will benefit from skilled therapeutic intervention in order to improve the  following deficits and impairments:  Pain, Decreased activity tolerance, Increased muscle spasms, Improper body mechanics, Decreased strength, Postural dysfunction, Decreased endurance, Decreased range of motion, Decreased balance  Visit Diagnosis: Chronic pain of left knee  Chronic right-sided low back pain without sciatica  Other abnormalities of gait and mobility  Chronic pain of right knee     Problem List Patient Active Problem List   Diagnosis Date Noted  . Chronic right-sided low back pain without sciatica 05/09/2019  . Migraines 06/07/2017  . Hearing loss 06/07/2017    Dorene Ar, PTA 06/11/2019, 12:46 PM  [facility] [address] [place], [place], [address] Phone: [phone]   Fax:  [phone]  Name: Maria Hernandez MRN: [id number] Date of Birth: 05-Feb-1976

## 2019-06-11 NOTE — Telephone Encounter (Signed)
Spoke with patient and offered her a visit with one of our providers, she reports she is stable at this point and symptoms for GERD resolving. She will call back if symptoms come back.

## 2019-06-13 ENCOUNTER — Other Ambulatory Visit: Payer: Self-pay

## 2019-06-13 ENCOUNTER — Encounter: Payer: Self-pay | Admitting: Family Medicine

## 2019-06-13 ENCOUNTER — Ambulatory Visit: Payer: No Typology Code available for payment source | Admitting: Family Medicine

## 2019-06-13 VITALS — BP 100/70 | Ht 67.0 in | Wt 139.0 lb

## 2019-06-13 DIAGNOSIS — G8929 Other chronic pain: Secondary | ICD-10-CM

## 2019-06-13 DIAGNOSIS — M25562 Pain in left knee: Secondary | ICD-10-CM | POA: Diagnosis not present

## 2019-06-13 DIAGNOSIS — M222X1 Patellofemoral disorders, right knee: Secondary | ICD-10-CM

## 2019-06-13 DIAGNOSIS — M545 Low back pain: Secondary | ICD-10-CM | POA: Diagnosis not present

## 2019-06-13 DIAGNOSIS — M25561 Pain in right knee: Secondary | ICD-10-CM

## 2019-06-13 DIAGNOSIS — M222X2 Patellofemoral disorders, left knee: Secondary | ICD-10-CM

## 2019-06-13 NOTE — Patient Instructions (Signed)
Keep up the good work! Tylenol 500mg  1-2 tabs three times a day. Take ibuprofen as needed in addition to this. Continue with physical therapy. Do home exercises on days you don't go to therapy. Follow up with me in 4 weeks but call or message me sooner if you're struggling.

## 2019-06-13 NOTE — Progress Notes (Signed)
PCP: Sandford Craze, NP  Subjective:   HPI: Patient is a 43 y.o. female here for bilateral knee, low back pain.  Patient reports overall she's doing better. Back pain is much improved ('10 times better') - doing home exercises and stretches. Pain in back is a central burning when this is present. Still struggling some with bilateral knee pain, worse on the left. Pain more sharp/sore at end of work shift - doing 4 hour shifts currently. Has done 5 physical therapy visits and has been taping knee. Taking tylenol and motrin - diclofenac may have been irritating her stomach. No swelling, skin changes.  Past Medical History:  Diagnosis Date  . Migraines     Current Outpatient Medications on File Prior to Visit  Medication Sig Dispense Refill  . diclofenac (VOLTAREN) 75 MG EC tablet Take 1 tablet by mouth twice daily 60 tablet 0  . HYDROcodone-acetaminophen (NORCO) 5-325 MG tablet Take 1 tablet by mouth every 6 (six) hours as needed for moderate pain. 20 tablet 0  . ibuprofen (ADVIL,MOTRIN) 200 MG tablet Take 200 mg by mouth as needed.    . meloxicam (MOBIC) 15 MG tablet Take 1 tablet (15 mg total) by mouth daily as needed. 30 tablet 1  . methylPREDNISolone (MEDROL DOSEPAK) 4 MG TBPK tablet Take orally per package instructions 21 tablet 0  . Multiple Vitamin (MULTIVITAMIN) tablet Take 1 tablet by mouth daily.    . nortriptyline (PAMELOR) 25 MG capsule Take 1 capsule (25 mg total) by mouth at bedtime. Take 1/2 tablet at night for 3 days. If tolerating well, increase to 1 tablet at night. 30 capsule 2  . oxyCODONE-acetaminophen (PERCOCET/ROXICET) 5-325 MG tablet Take 1 tablet by mouth every 6 (six) hours as needed for severe pain. 20 tablet 0  . SUMAtriptan (IMITREX) 50 MG tablet TAKE 1 TABLET BY MOUTH BY MOUTH AS NEEDED FOR MIGRAINE HEADACHE. MAY REPEAT IN 2 HOURS AND TAKE 1 TABLET IF NEEDED. 10 tablet 5   No current facility-administered medications on file prior to visit.     Past  Surgical History:  Procedure Laterality Date  . AUGMENTATION MAMMAPLASTY    . BREAST ENHANCEMENT SURGERY Bilateral 2005  . INNER EAR SURGERY Left    Reports 2 prior ear surgeries for prosthesis. 3rd surgery coming up in January 2019  . MANDIBLE OSTEOTOMY  1995    Allergies  Allergen Reactions  . Verapamil Hives  . Macrobid [Nitrofurantoin Macrocrystal]     Light headed    Social History   Socioeconomic History  . Marital status: Single    Spouse name: Not on file  . Number of children: Not on file  . Years of education: Not on file  . Highest education level: Not on file  Occupational History  . Not on file  Social Needs  . Financial resource strain: Not on file  . Food insecurity    Worry: Not on file    Inability: Not on file  . Transportation needs    Medical: Not on file    Non-medical: Not on file  Tobacco Use  . Smoking status: Never Smoker  . Smokeless tobacco: Never Used  Substance and Sexual Activity  . Alcohol use: Yes    Frequency: Never    Comment: occasional wine "1- 2 per month"  . Drug use: Never  . Sexual activity: Not Currently    Birth control/protection: None  Lifestyle  . Physical activity    Days per week: Not on file  Minutes per session: Not on file  . Stress: Not on file  Relationships  . Social Herbalist on phone: Not on file    Gets together: Not on file    Attends religious service: Not on file    Active member of club or organization: Not on file    Attends meetings of clubs or organizations: Not on file    Relationship status: Not on file  . Intimate partner violence    Fear of current or ex partner: Not on file    Emotionally abused: Not on file    Physically abused: Not on file    Forced sexual activity: Not on file  Other Topics Concern  . 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 Hypothyroidism Mother   . Hypertension Father   . Diabetes type II Father   . Breast cancer Maternal Grandmother     BP 100/70   Ht 5\' 7"  (1.702 m)   Wt 139 lb (63 kg)   BMI 21.77 kg/m   Review of Systems: See HPI above.     Objective:  Physical Exam:  Gen: NAD, comfortable in exam room  Right knee: VMO atrophy.  No other gross deformity, ecchymoses, no effusion. Mild TTP post patellar facets. FROM with 5/5 strength. Negative ant/post drawers. Negative valgus/varus testing. Negative lachmans. Negative mcmurrays, apleys, patellar apprehension. NV intact distally.  Left knee: VMO atrophy.  No other gross deformity, ecchymoses, no effusion. Mild TTP post patellar facets, lateral joint line. FROM with 5/5 strength. Negative ant/post drawers. Negative valgus/varus testing. Negative lachmans. Negative mcmurrays, apleys, patellar apprehension. NV intact distally.  Back: No gross deformity, scoliosis. No TTP currently.  No midline or bony TTP. FROM without pain. Strength LEs 5/5 all muscle groups.   2+ MSRs in patellar and achilles tendons, equal bilaterally. Negative SLRs. Sensation intact to light touch bilaterally. Negative logroll bilateral hips   Assessment & Plan:  1. Bilateral knee pain - 2/2 patellofemoral syndrome.  Improving with PT and taping - continue with these.  We discussed consideration of extending her 4 hour work days - she would like to try to advance to 8.  Tylenol, ibuprofen as needed.    2. Low back pain - improving with home exercises.  Continue with these.  Tylenol, ibuprofen.

## 2019-06-14 ENCOUNTER — Encounter: Payer: No Typology Code available for payment source | Admitting: Physical Therapy

## 2019-06-15 ENCOUNTER — Ambulatory Visit: Payer: No Typology Code available for payment source | Admitting: Physical Therapy

## 2019-06-15 ENCOUNTER — Encounter: Payer: Self-pay | Admitting: Physical Therapy

## 2019-06-15 ENCOUNTER — Other Ambulatory Visit: Payer: Self-pay

## 2019-06-15 DIAGNOSIS — M25561 Pain in right knee: Secondary | ICD-10-CM | POA: Diagnosis not present

## 2019-06-15 DIAGNOSIS — M25562 Pain in left knee: Secondary | ICD-10-CM

## 2019-06-15 DIAGNOSIS — R2689 Other abnormalities of gait and mobility: Secondary | ICD-10-CM

## 2019-06-15 DIAGNOSIS — G8929 Other chronic pain: Secondary | ICD-10-CM

## 2019-06-15 NOTE — Therapy (Signed)
[place], [place], [address] Phone: [phone]   Fax:  [phone]  Physical Therapy Treatment  Patient Details  Name: EDDYE BROXTERMAN MRN: [id number] Date of Birth: 1976-01-08 Referring Provider (PT): Karlton Lemon MD   Encounter Date: 06/15/2019  PT End of Session - 06/15/19 1131    Visit Number  6    Number of Visits  13    Date for PT Re-Evaluation  07/02/19    Authorization Type  [facility]    PT Start Time  1100    PT Stop Time  1140    PT Time Calculation (min)  40 min       Past Medical History:  Diagnosis Date  . Migraines     Past Surgical History:  Procedure Laterality Date  . AUGMENTATION MAMMAPLASTY    . BREAST ENHANCEMENT SURGERY Bilateral 2005  . INNER EAR SURGERY Left    Reports 2 prior ear surgeries for prosthesis. 3rd surgery coming up in January 2019  . MANDIBLE OSTEOTOMY  1995    There were no vitals filed for this visit.  Subjective Assessment - 06/15/19 1237    Subjective  I taped myself for work. I worked two 4 hour shifts in a row. Now I am having pinching like I was in the beginning. Next week I will have back to back 8 hour shifts.    Currently in Pain?  Yes    Pain Score  --   did not rate   Pain Location  Knee    Pain Orientation  Right;Left    Pain Descriptors / Indicators  --   pinching   Pain Type  Chronic pain    Aggravating Factors   working porlonged, back to back days    Pain Relieving Factors  rest, meds                       [facility] Adult PT Treatment/Exercise - 06/15/19 0001      Knee/Hip Exercises: Aerobic   Recumbent Bike  L2 x 5 minutes       Knee/Hip Exercises: Seated   Long Arc Quad  Right;Left;10 reps    Long Arc Quad Limitations  with ball squeeze     Sit to [employer]  10 reps      Knee/Hip Exercises: Sidelying   Hip ABduction  20 reps      Modalities   Modalities  Ultrasound      Ultrasound   Ultrasound Location  medial  joint line , bilateral    Ultrasound Parameters  1.0 w/cm2 50%  x 8 minutes     Ultrasound Goals  Pain               PT Short Term Goals - 05/21/19 1223      PT SHORT TERM GOAL #1   Title  pt to be I with initial HEP    Time  3    Period  Weeks    Status  New    Target Date  06/11/19        PT Long Term Goals - 05/21/19 1223      PT LONG TERM GOAL #1   Title  pt to increase bil hip abductor/ extensor strengthening to >/= 4+/5 to promtoe hip/ knee stability    Time  6    Period  Weeks    Status  New    Target Date  07/02/19  PT LONG TERM GOAL #2   Title  pt to be able to stand/ walking >/= 60 min with no report of pain or limitations for endurnace required for work related activities.    Time  6    Period  Weeks    Status  New    Target Date  07/02/19      PT LONG TERM GOAL #3   Title  pt to demo proper posture and lifting mechanics to redcue and prevent low back and bil knee pain    Time  6    Period  Weeks    Status  New    Target Date  07/02/19      PT LONG TERM GOAL #4   Title  pt to be able to perform work out routine with no report of pain/ limitation for personal goals.    Time  6    Period  Weeks    Status  New    Target Date  07/02/19      PT LONG TERM GOAL #5   Title  pt to be I with all HEP given as of last visit to maintain and progress current level of function    Time  6    Period  Weeks    Status  New    Target Date  07/02/19            Plan - 06/15/19 1243    Clinical Impression Statement  Pt reports retrurn of pinching after working 4 shifts, 2 days in a row. She has palpable tenderness along medial joint line and distal VMO. [place] used to decreased inflammation and improve circulation. Afterward she reported no more pinching. Continued with VMO activation and hip strength. She will work 2 eight hour days next week and is concerned about her response.    PT Next Visit Plan  further assess benefit of [place], update HEP, how was  McConnel taping PRN, activation techniqus, vastus lateralis , core / hip strengthening, try reformer for hip/ knee strengthning, resonse to TPDN    PT Home Exercise Plan  Access code: [email or web address] - LTR, hamstring stretching, hip flexor MET, ball squeeze VMO activation, cat/cow, added ball squeeze SAQ and LAQ, sidelying hip external rotation.       Patient will benefit from skilled therapeutic intervention in order to improve the following deficits and impairments:  Pain, Decreased activity tolerance, Increased muscle spasms, Improper body mechanics, Decreased strength, Postural dysfunction, Decreased endurance, Decreased range of motion, Decreased balance  Visit Diagnosis: Chronic pain of left knee  Chronic right-sided low back pain without sciatica  Other abnormalities of gait and mobility  Chronic pain of right knee     Problem List Patient Active Problem List   Diagnosis Date Noted  . Chronic right-sided low back pain without sciatica 05/09/2019  . Migraines 06/07/2017  . Hearing loss 06/07/2017    Dorene Ar, PTA 06/15/2019, 12:45 PM  [facility] [address] [place], [place], [address] Phone: [phone]   Fax:  [phone]  Name: RAYAH FINES MRN: [id number] Date of Birth: 10-28-1975

## 2019-06-18 ENCOUNTER — Encounter: Payer: Self-pay | Admitting: Physical Therapy

## 2019-06-18 ENCOUNTER — Other Ambulatory Visit: Payer: Self-pay

## 2019-06-18 ENCOUNTER — Ambulatory Visit: Payer: No Typology Code available for payment source | Admitting: Physical Therapy

## 2019-06-18 DIAGNOSIS — M545 Low back pain: Secondary | ICD-10-CM

## 2019-06-18 DIAGNOSIS — R2689 Other abnormalities of gait and mobility: Secondary | ICD-10-CM

## 2019-06-18 DIAGNOSIS — M25561 Pain in right knee: Secondary | ICD-10-CM | POA: Diagnosis not present

## 2019-06-18 DIAGNOSIS — G8929 Other chronic pain: Secondary | ICD-10-CM

## 2019-06-18 NOTE — Therapy (Signed)
[place], [place], [address] Phone: [phone]   Fax:  [phone]  Physical Therapy Treatment  Patient Details  Name: Maria Hernandez MRN: [id number] Date of Birth: Sep 10, 1975 Referring Provider (PT): Karlton Lemon MD   Encounter Date: 06/18/2019  PT End of Session - 06/18/19 1153    Visit Number  7    Number of Visits  13    Date for PT Re-Evaluation  07/02/19    Authorization Type  [facility]    PT Start Time  1100    PT Stop Time  1148    PT Time Calculation (min)  48 min    Activity Tolerance  Patient tolerated treatment well    Behavior During Therapy  [facility] for tasks assessed/performed       Past Medical History:  Diagnosis Date  . Migraines     Past Surgical History:  Procedure Laterality Date  . AUGMENTATION MAMMAPLASTY    . BREAST ENHANCEMENT SURGERY Bilateral 2005  . INNER EAR SURGERY Left    Reports 2 prior ear surgeries for prosthesis. 3rd surgery coming up in January 2019  . MANDIBLE OSTEOTOMY  1995    There were no vitals filed for this visit.  Subjective Assessment - 06/18/19 1105    Subjective  "After the last 2 - 4hour shifts my knee was pretty sore.    Patient Stated Goals  to decrease back and bil knee pain    Currently in Pain?  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 (with 2 red 1 blue), bridge (2 red, 1 blue)      Lumbar Exercises: Aerobic   Elliptical  L4 x 4 min ramp L 4      Knee/Hip Exercises: Seated   Long Arc Quad Limitations  2 x 20 with bil inversion      Manual Therapy   Manual therapy comments  skilled palpation and monitoring of pt throughout TPDN    Soft tissue  mobilization  IASTM along the vastus lateralis        Trigger Point Dry Needling - 06/18/19 0001    Consent Given?  Yes    Education Handout Provided  Previously provided    Vastus lateralis Response  Twitch response elicited;Palpable increased muscle length             PT Short Term Goals - 06/18/19 1155      PT SHORT TERM GOAL #1   Title  pt to be I with initial HEP    Period  Weeks    Status  Achieved        PT Long Term Goals - 06/18/19 1156      PT LONG TERM GOAL #1   Title  pt to increase bil hip abductor/ extensor strengthening to >/= 4+/5 to promtoe hip/ knee stability    Period  Weeks    Status  On-going      PT LONG TERM GOAL #2   Title  pt to be able to stand/ walking >/= 60 min with no report of pain or limitations for endurance required for work related activities.    Period  Weeks    Status  On-going      PT LONG TERM GOAL #3   Title  pt to demo proper posture and lifting mechanics to redcue and prevent low back and bil knee pain    Period  Weeks    Status  On-going      PT LONG TERM GOAL #4   Title  pt to be able to perform work out routine with no report of pain/ limitation for personal goals.    Period  Weeks    Status  On-going      PT LONG TERM GOAL #5   Title  pt to be I with all HEP given as of last visit to maintain and progress current level of function    Period  Weeks    Status  On-going            Plan - 06/18/19 1154    Clinical Impression Statement  pt reported feeling sore after the last 2- 4 hour shifts. continued TPDN Focusing on the vastus lateralis followed with IASTM techniques. 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    PT Treatment/Interventions  ADLs/Self Care Home Management;Cryotherapy;Electrical Stimulation;Iontophoresis '4mg'$ /ml Dexamethasone;Moist Heat;Traction;Ultrasound;Gait training;Stair training;Therapeutic  activities;Therapeutic exercise;Balance training;Neuromuscular re-education;Manual techniques;Passive range of motion;Dry needling;Taping    PT Next Visit Plan  further assess benefit of [place], update HEP, how was McConnel taping PRN, activation techniqus, vastus lateralis , core / hip strengthening, try reformer for hip/ knee strengthning, response to TPDN    PT Home Exercise Plan  Access code: [email or web address] - LTR, hamstring stretching, hip flexor MET, ball squeeze VMO activation, cat/cow, added ball squeeze SAQ and LAQ, sidelying hip external rotation.    Consulted and Agree with Plan of Care  Patient       Patient will benefit from skilled therapeutic intervention in order to improve the following deficits and impairments:  Pain, Decreased activity tolerance, Increased muscle spasms, Improper body mechanics, Decreased strength, Postural dysfunction, Decreased endurance, Decreased range of motion, Decreased balance  Visit Diagnosis: Chronic pain of left knee  Chronic right-sided low back pain without sciatica  Other abnormalities of gait and mobility  Chronic pain of right knee     Problem List Patient Active Problem List   Diagnosis Date Noted  . Chronic right-sided low back pain without sciatica 05/09/2019  . Migraines 06/07/2017  . Hearing loss 06/07/2017    Starr Lake PT, DPT, LAT, ATC  06/18/19  12:01 PM      [facility] [address] [place], [place], [address] Phone: [phone]   Fax:  [phone]  Name: Maria Hernandez MRN: [id number] Date of Birth: [birth year]

## 2019-06-21 ENCOUNTER — Other Ambulatory Visit: Payer: Self-pay

## 2019-06-21 ENCOUNTER — Encounter: Payer: Self-pay | Admitting: Physical Therapy

## 2019-06-21 ENCOUNTER — Ambulatory Visit: Payer: No Typology Code available for payment source | Admitting: Physical Therapy

## 2019-06-21 DIAGNOSIS — M25562 Pain in left knee: Secondary | ICD-10-CM

## 2019-06-21 DIAGNOSIS — R2689 Other abnormalities of gait and mobility: Secondary | ICD-10-CM

## 2019-06-21 DIAGNOSIS — M545 Low back pain, unspecified: Secondary | ICD-10-CM

## 2019-06-21 DIAGNOSIS — G8929 Other chronic pain: Secondary | ICD-10-CM

## 2019-06-21 DIAGNOSIS — M25561 Pain in right knee: Secondary | ICD-10-CM | POA: Diagnosis not present

## 2019-06-21 NOTE — Therapy (Signed)
[place], [place], [address] Phone: [phone]   Fax:  [phone]  Physical Therapy Treatment  Patient Details  Name: Maria Hernandez MRN: [id number] Date of Birth: 26-May-1976 Referring Provider (PT): Karlton Lemon MD   Encounter Date: 06/21/2019  PT End of Session - 06/21/19 1103    Visit Number  8    Number of Visits  13    Date for PT Re-Evaluation  07/02/19    Authorization Type  [facility]    PT Start Time  1103    PT Stop Time  1148    PT Time Calculation (min)  45 min    Activity Tolerance  Patient tolerated treatment well    Behavior During Therapy  [facility] for tasks assessed/performed       Past Medical History:  Diagnosis Date  . Migraines     Past Surgical History:  Procedure Laterality Date  . AUGMENTATION MAMMAPLASTY    . BREAST ENHANCEMENT SURGERY Bilateral 2005  . INNER EAR SURGERY Left    Reports 2 prior ear surgeries for prosthesis. 3rd surgery coming up in January 2019  . MANDIBLE OSTEOTOMY  1995    There were no vitals filed for this visit.  Subjective Assessment - 06/21/19 1104    Subjective  " I worked my 8 hour shift last night wasn't too busy but still got some pinchy  which pain got up to a 7/10"    Currently in Pain?  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    Referring Provider (PT)  Karlton Lemon MD                   [facility] Adult PT Treatment/Exercise - 06/21/19 0001      Lumbar Exercises: Aerobic   Elliptical  L4 x 4 min ramp L 4      Knee/Hip Exercises: Supine   Short Arc Quad Sets  Strengthening;Left;Other (comment)   going to fatigue   Short Arc Quad Sets Limitations  with ball  squeeze and 4# L only      Knee/Hip Exercises: Sidelying   Hip ABduction  Strengthening;Both;2 sets   going to fatigue   Hip ABduction Limitations  with 4#      Modalities   Modalities  Iontophoresis      Iontophoresis   Type of Iontophoresis  Dexamethasone    Location  Medial aspect of the L knee    Dose  '4mg'$ /ml    Time  6 hour patch      Manual Therapy   Soft tissue mobilization  IASTM along the patellar/ quad tendon, and Medial retinaculum             PT Education - 06/21/19 1148    Education Details  what iontophoresis is and benefits/ length of wear    Person(s) Educated  Patient    Methods  Explanation;Verbal cues;Handout    Comprehension  Verbalized understanding;Verbal cues required       PT Short Term Goals - 06/18/19 1155  PT SHORT TERM GOAL #1   Title  pt to be I with initial HEP    Period  Weeks    Status  Achieved        PT Long Term Goals - 06/18/19 1156      PT LONG TERM GOAL #1   Title  pt to increase bil hip abductor/ extensor strengthening to >/= 4+/5 to promtoe hip/ knee stability    Period  Weeks    Status  On-going      PT LONG TERM GOAL #2   Title  pt to be able to stand/ walking >/= 60 min with no report of pain or limitations for endurance required for work related activities.    Period  Weeks    Status  On-going      PT LONG TERM GOAL #3   Title  pt to demo proper posture and lifting mechanics to redcue and prevent low back and bil knee pain    Period  Weeks    Status  On-going      PT LONG TERM GOAL #4   Title  pt to be able to perform work out routine with no report of pain/ limitation for personal goals.    Period  Weeks    Status  On-going      PT LONG TERM GOAL #5   Title  pt to be I with all HEP given as of last visit to maintain and progress current level of function    Period  Weeks    Status  On-going            Plan - 06/21/19 1149    Clinical Impression Statement  pt reported some pinching inthe  knee and soreness today which she reports is feeling at a 4-5/10 today. Cotninued STW along the patellar/ quad tendon and medial retinaculum. continued working hip/ knee strength working into [employer] which she did well with. 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 educated and conset was provided for iontophoresis.    Rehab Potential  Good    PT Treatment/Interventions  ADLs/Self Care Home Management;Cryotherapy;Electrical Stimulation;Iontophoresis '4mg'$ /ml Dexamethasone;Moist Heat;Traction;Ultrasound;Gait training;Stair training;Therapeutic activities;Therapeutic exercise;Balance training;Neuromuscular re-education;Manual techniques;Passive range of motion;Dry needling;Taping    PT Next Visit Plan  further assess benefit of [place], update HEP, how was McConnel taping PRN, activation techniqus, vastus lateralis , core / hip strengthening, try reformer for hip/ knee strengthning, response to iontophoresis    PT Home Exercise Plan  Access code: [email or web address] - LTR, hamstring stretching, hip flexor MET, ball squeeze VMO activation, cat/cow, added ball squeeze SAQ and LAQ, sidelying hip external rotation.    Consulted and Agree with Plan of Care  Patient       Patient will benefit from skilled therapeutic intervention in order to improve the following deficits and impairments:  Pain, Decreased activity tolerance, Increased muscle spasms, Improper body mechanics, Decreased strength, Postural dysfunction, Decreased endurance, Decreased range of motion, Decreased balance  Visit Diagnosis: Chronic pain of left knee  Chronic right-sided low back pain without sciatica  Other abnormalities of gait and mobility  Chronic pain of right knee     Problem List Patient Active Problem List   Diagnosis Date Noted  . Chronic right-sided low back pain without sciatica 05/09/2019  . Migraines 06/07/2017  . Hearing loss 06/07/2017   Starr Lake PT, DPT,  LAT, ATC  06/21/19  11:52 AM      [facility] [address]  [place], [place], [address] Phone: [phone]   Fax:  [phone]  Name: JALONDA ANTIGUA MRN: [id number] Date of Birth: 1975/12/08

## 2019-06-21 NOTE — Patient Instructions (Signed)

## 2019-06-25 ENCOUNTER — Other Ambulatory Visit: Payer: Self-pay

## 2019-06-25 ENCOUNTER — Ambulatory Visit: Payer: No Typology Code available for payment source | Admitting: Physical Therapy

## 2019-06-25 ENCOUNTER — Encounter: Payer: Self-pay | Admitting: Family

## 2019-06-25 ENCOUNTER — Encounter: Payer: Self-pay | Admitting: Physical Therapy

## 2019-06-25 DIAGNOSIS — R2689 Other abnormalities of gait and mobility: Secondary | ICD-10-CM

## 2019-06-25 DIAGNOSIS — M545 Low back pain, unspecified: Secondary | ICD-10-CM

## 2019-06-25 DIAGNOSIS — M25561 Pain in right knee: Secondary | ICD-10-CM | POA: Diagnosis not present

## 2019-06-25 DIAGNOSIS — G8929 Other chronic pain: Secondary | ICD-10-CM

## 2019-06-25 DIAGNOSIS — M25562 Pain in left knee: Secondary | ICD-10-CM

## 2019-06-25 MED ORDER — SUMATRIPTAN SUCCINATE 50 MG PO TABS: ORAL_TABLET | ORAL | 5 refills | Status: DC

## 2019-06-25 NOTE — Therapy (Signed)
[place], [place], [address] Phone: [phone]   Fax:  [phone]  Physical Therapy Treatment  Patient Details  Name: Maria Hernandez MRN: [id number] Date of Birth: March 29, 1976 Referring Provider (PT): Karlton Lemon MD   Encounter Date: 06/25/2019  PT End of Session - 06/25/19 0930    Visit Number  9    Number of Visits  13    Date for PT Re-Evaluation  07/02/19    Authorization Type  [facility]    PT Start Time  0930    PT Stop Time  1010    PT Time Calculation (min)  40 min    Activity Tolerance  Patient tolerated treatment well       Past Medical History:  Diagnosis Date  . Migraines     Past Surgical History:  Procedure Laterality Date  . AUGMENTATION MAMMAPLASTY    . BREAST ENHANCEMENT SURGERY Bilateral 2005  . INNER EAR SURGERY Left    Reports 2 prior ear surgeries for prosthesis. 3rd surgery coming up in January 2019  . MANDIBLE OSTEOTOMY  1995    There were no vitals filed for this visit.      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    Referring Provider (PT)  Karlton Lemon MD                   [facility] Adult PT Treatment/Exercise - 06/25/19 0001      Knee/Hip Exercises: Aerobic   Elliptical  L5 x 8 min ramp L5      Knee/Hip Exercises: Standing   Forward Lunges  Both;2 sets;10 reps    Forward Step Up  2 sets;Both   on/ off bosu   Forward Step Up Limitations  pt did require intermittent CGA for safety      Ultrasound   Ultrasound Location  medial joint line , bilateral    Ultrasound Parameters  1.0 w/cm2 50%  x 8 minutes     Ultrasound Goals  Pain      Iontophoresis   Type of Iontophoresis  Dexamethasone    Location  Medial aspect of the L knee    Dose  106m/ml    Time  6 hour patch      Manual Therapy   Soft tissue mobilization  IASTM along the patellar/ quad tendon, and Medial retinaculum                PT Short Term Goals - 06/18/19 1155      PT SHORT TERM GOAL #1   Title  pt to be I with initial HEP    Period  Weeks    Status  Achieved        PT Long Term Goals - 06/18/19 1156      PT LONG TERM GOAL #1   Title  pt to increase bil hip abductor/ extensor strengthening to >/= 4+/5 to promtoe hip/ knee stability    Period  Weeks    Status  On-going      PT LONG TERM GOAL #2   Title  pt to be able to stand/ walking >/= 60 min with no report of pain or limitations for endurance required for work related activities.    Period  Weeks    Status  On-going      PT LONG TERM GOAL #3  Title  pt to demo proper posture and lifting mechanics to redcue and prevent low back and bil knee pain    Period  Weeks    Status  On-going      PT LONG TERM GOAL #4   Title  pt to be able to perform work out routine with no report of pain/ limitation for personal goals.    Period  Weeks    Status  On-going      PT LONG TERM GOAL #5   Title  pt to be I with all HEP given as of last visit to maintain and progress current level of function    Period  Weeks    Status  On-going            Plan - 06/25/19 1013    Clinical Impression Statement  pt reports consistency with her HEP and has been working on increasing reps for endurance training. continued working standing hip/ knee strengthening and worked on dynamic balance stepping up/ down on to [employer]. She did well with exercise noting no pinching in the knee. continue applying iontopatch end of session.    PT Treatment/Interventions  ADLs/Self Care Home Management;Cryotherapy;Electrical Stimulation;Iontophoresis 39m/ml Dexamethasone;Moist Heat;Traction;Ultrasound;Gait training;Stair training;Therapeutic activities;Therapeutic exercise;Balance training;Neuromuscular re-education;Manual techniques;Passive range of motion;Dry needling;Taping    PT Next Visit Plan  [place], update HEP, , activation techniqus, vastus lateralis ,  core / hip strengthening, try reformer for hip/ knee strengthning, response to iontophoresis, progress dynamic balance    PT Home Exercise Plan  Access code: [email or web address] - LTR, hamstring stretching, hip flexor MET, ball squeeze VMO activation, cat/cow, added ball squeeze SAQ and LAQ, sidelying hip external rotation.    Consulted and Agree with Plan of Care  Patient       Patient will benefit from skilled therapeutic intervention in order to improve the following deficits and impairments:     Visit Diagnosis: Chronic pain of left knee  Chronic right-sided low back pain without sciatica  Other abnormalities of gait and mobility  Chronic pain of right knee     Problem List Patient Active Problem List   Diagnosis Date Noted  . Chronic right-sided low back pain without sciatica 05/09/2019  . Migraines 06/07/2017  . Hearing loss 06/07/2017   KStarr LakePT, DPT, LAT, ATC  06/25/19  10:15 AM      [facility] [address]Phone: [phone]  Fax:  [phone] Name: Maria GILDNERMRN: [id number] of Birth: 702-15-1977

## 2019-07-03 ENCOUNTER — Encounter: Payer: Self-pay | Admitting: Family Medicine

## 2019-07-05 ENCOUNTER — Other Ambulatory Visit: Payer: Self-pay

## 2019-07-05 ENCOUNTER — Encounter: Payer: Self-pay | Admitting: Physical Therapy

## 2019-07-05 ENCOUNTER — Ambulatory Visit: Payer: No Typology Code available for payment source | Admitting: Physical Therapy

## 2019-07-05 DIAGNOSIS — R2689 Other abnormalities of gait and mobility: Secondary | ICD-10-CM

## 2019-07-05 DIAGNOSIS — M25561 Pain in right knee: Secondary | ICD-10-CM | POA: Diagnosis not present

## 2019-07-05 DIAGNOSIS — G8929 Other chronic pain: Secondary | ICD-10-CM

## 2019-07-05 NOTE — Therapy (Signed)
[place], [place], [address] Phone: [phone]   Fax:  [phone]  Physical Therapy Treatment / Re-certification  Patient Details  Name: Maria Hernandez MRN: [id number] Date of Birth: 07-08-1975 Referring Provider (PT): Karlton Lemon MD   Encounter Date: 07/05/2019  PT End of Session - 07/05/19 1143    Visit Number  10    Number of Visits  18    Date for PT Re-Evaluation  08/02/19    Authorization Type  [facility]    PT Start Time  8786    PT Stop Time  1225    PT Time Calculation (min)  41 min    Activity Tolerance  Patient tolerated treatment well    Behavior During Therapy  [facility] for tasks assessed/performed       Past Medical History:  Diagnosis Date  . Migraines     Past Surgical History:  Procedure Laterality Date  . AUGMENTATION MAMMAPLASTY    . BREAST ENHANCEMENT SURGERY Bilateral 2005  . INNER EAR SURGERY Left    Reports 2 prior ear surgeries for prosthesis. 3rd surgery coming up in January 2019  . MANDIBLE OSTEOTOMY  1995    There were no vitals filed for this visit.  Subjective Assessment - 07/05/19 1144    Subjective  "been doing pretty good, still get some soreness with the 12 hour shifts. I have still been taping at work which helps"    Patient Stated Goals  to decrease back and bil knee pain    Currently in Pain?  Yes    Pain Score  5     Pain Location  Knee    Pain Orientation  Right;Left    Pain Descriptors / Indicators  Aching    Pain Type  Chronic pain    Pain Onset  More than a month ago    Pain Frequency  Intermittent    Aggravating Factors   working prolonged walking.         [facility] PT Assessment - 07/05/19 0001      Strength   Right Hip ABduction  4/5    Left Hip ABduction  4/5                   [facility] Adult PT Treatment/Exercise - 07/05/19 0001      Knee/Hip Exercises: Seated   Other Seated Knee/Hip Exercises  posterior tibialis strengthening 2  x 20 with green theraband       Knee/Hip Exercises: Supine   Bridges with Clamshell  Strengthening;Both;2 sets;15 reps   with green theraband and slightly sliding RLE forward     Ultrasound   Ultrasound Location  medial joint line , bilateral    Ultrasound Parameters  49mz @ 1.0 w/cm2 100%  x 6 minutes       Iontophoresis   Type of Iontophoresis  Dexamethasone    Location  Medial aspect of the L knee    Dose  '4mg'$ /ml    Time  6 hour patch      Manual Therapy   Soft tissue mobilization  IASTM along the patellar/ quad tendon, and Medial retinaculum             PT Education - 07/05/19 1227    Education Details  Reviewed HEP and updated today. discussed updated POC    Person(s) Educated  Patient    Methods  Explanation;Verbal cues;Handout    Comprehension  Verbalized understanding;Verbal cues required  PT Short Term Goals - 06/18/19 1155      PT SHORT TERM GOAL #1   Title  pt to be I with initial HEP    Period  Weeks    Status  Achieved        PT Long Term Goals - 07/05/19 1150      PT LONG TERM GOAL #1   Title  pt to increase bil hip abductor/ extensor strengthening to >/= 4+/5 to promtoe hip/ knee stability    Baseline  4/5 hip strength    Period  Weeks    Status  On-going    Target Date  08/02/19      PT LONG TERM GOAL #2   Title  pt to be able to stand/ walking >/= 60 min with no report of pain or limitations for endurance required for work related activities.    Baseline  able to walk/ stand 60 min 7-8/10    Time  6    Period  Weeks    Status  On-going    Target Date  08/02/19      PT LONG TERM GOAL #3   Title  pt to demo proper posture and lifting mechanics to redcue and prevent low back and bil knee pain    Baseline  back has been good,    Time  6    Period  Weeks    Status  On-going    Target Date  08/02/19      PT LONG TERM GOAL #4   Title  pt to be able to perform work out routine with no report of pain/ limitation for personal goals.     Period  Weeks    Status  On-going    Target Date  08/02/19      PT LONG TERM GOAL #5   Title  pt to be I with all HEP given as of last visit to maintain and progress current level of function    Period  Weeks    Status  On-going    Target Date  08/02/19            Plan - 07/05/19 1227    Clinical Impression Statement  Ayleen reports continued pain in the knee today at 5/10 and notes the pain does increase to a 7/10 more at the end of a 12 hour shifting. she is making good progress toward her goals. Continued [place] to promote pain relief along the medial retinaculum. Continued working hip/ ankle strengthening to promote knee stability. pt would benefit from continued physical therapy with current plan of care to work on strength, endurance and stability and maximize her function and work toward discharge.    Rehab Potential  Good    PT Frequency  2x / week    PT Duration  4 weeks    PT Treatment/Interventions  ADLs/Self Care Home Management;Cryotherapy;Electrical Stimulation;Iontophoresis '4mg'$ /ml Dexamethasone;Moist Heat;Traction;Ultrasound;Gait training;Stair training;Therapeutic activities;Therapeutic exercise;Balance training;Neuromuscular re-education;Manual techniques;Passive range of motion;Dry needling;Taping    PT Next Visit Plan  [place] PRN, update HEP, activation techniqus, vastus lateralis, endurance training core / hip strengthening, try reformer for hip/ knee strengthning, response to iontophoresis, progress dynamic balance    PT Home Exercise Plan  Access code: [email or web address] - LTR, hamstring stretching, hip flexor MET, ball squeeze VMO activation, cat/cow, added ball squeeze SAQ and LAQ, sidelying hip external rotation. bridge with clamshell, posterior tib strengthening.    Consulted and Agree with Plan of Care  Patient  Patient will benefit from skilled therapeutic intervention in order to improve the following deficits and impairments:  Pain, Decreased activity tolerance,  Increased muscle spasms, Improper body mechanics, Decreased strength, Postural dysfunction, Decreased endurance, Decreased range of motion, Decreased balance  Visit Diagnosis: Chronic pain of left knee  Chronic right-sided low back pain without sciatica  Other abnormalities of gait and mobility  Chronic pain of right knee     Problem List Patient Active Problem List   Diagnosis Date Noted  . Chronic right-sided low back pain without sciatica 05/09/2019  . Migraines 06/07/2017  . Hearing loss 06/07/2017   Starr Lake PT, DPT, LAT, ATC  07/05/19  12:38 PM      [facility] [address] [place], [place], [address] Phone: [phone]   Fax:  [phone]  Name: DARIELYS GIGLIA MRN: [id number] Date of Birth: Jul 07, 1975

## 2019-07-10 ENCOUNTER — Encounter: Payer: Self-pay | Admitting: Physical Therapy

## 2019-07-10 ENCOUNTER — Other Ambulatory Visit: Payer: Self-pay

## 2019-07-10 ENCOUNTER — Ambulatory Visit: Payer: No Typology Code available for payment source | Attending: Family Medicine | Admitting: Physical Therapy

## 2019-07-10 DIAGNOSIS — M545 Low back pain, unspecified: Secondary | ICD-10-CM

## 2019-07-10 DIAGNOSIS — M25562 Pain in left knee: Secondary | ICD-10-CM | POA: Insufficient documentation

## 2019-07-10 DIAGNOSIS — G8929 Other chronic pain: Secondary | ICD-10-CM | POA: Insufficient documentation

## 2019-07-10 DIAGNOSIS — M25561 Pain in right knee: Secondary | ICD-10-CM | POA: Diagnosis present

## 2019-07-10 DIAGNOSIS — R2689 Other abnormalities of gait and mobility: Secondary | ICD-10-CM | POA: Diagnosis present

## 2019-07-10 NOTE — Therapy (Signed)
[place], [place], [address] Phone: [phone]   Fax:  [phone]  Physical Therapy Treatment  Patient Details  Name: Maria Hernandez MRN: [id number] Date of Birth: 04/18/1976 Referring Provider (PT): Karlton Lemon MD   Encounter Date: 07/10/2019  PT End of Session - 07/10/19 1058    Visit Number  11    Number of Visits  18    Date for PT Re-Evaluation  08/02/19    Authorization Type  [facility]    PT Start Time  1059    PT Stop Time  1147    PT Time Calculation (min)  48 min    Activity Tolerance  Patient tolerated treatment well    Behavior During Therapy  [facility] for tasks assessed/performed       Past Medical History:  Diagnosis Date  . Migraines     Past Surgical History:  Procedure Laterality Date  . AUGMENTATION MAMMAPLASTY    . BREAST ENHANCEMENT SURGERY Bilateral 2005  . INNER EAR SURGERY Left    Reports 2 prior ear surgeries for prosthesis. 3rd surgery coming up in January 2019  . MANDIBLE OSTEOTOMY  1995    There were no vitals filed for this visit.  Subjective Assessment - 07/10/19 1059    Subjective  "i did my 12 hour yesterday and still had some soreness in the knee on the L rated at a 7-8/10 and right now it s a 4-5/10"    Patient Stated Goals  to decrease back and bil knee pain    Currently in Pain?  Yes    Pain Score  4     Pain Orientation  Right;Left    Pain Type  Chronic pain                       [facility] Adult PT Treatment/Exercise - 07/10/19 0001      Self-Care   Self-Care  Other Self-Care Comments    Other Self-Care Comments   how to perform patella mobs to promote stretching of the medial retinaculum       Lumbar Exercises: Aerobic   Elliptical  L1 x 6 min ramp L1      Knee/Hip Exercises: Machines for Strengthening   Total Gym Leg Press  2 x 15 con bil/ ecc LLE 20#   verbal/ tactile cues for proper form     Manual Therapy   Manual therapy comments   MTPR along vastus laterals working proximal to distal x 4    Joint Mobilization  low load long duration medial / lateral patellar glie   pt noted significant relief of pain following mobs         Balance Exercises - 07/10/19 1158      Balance Exercises: Standing   SLS  Eyes open;2 reps   holding black physioball performing ABC;s       PT Education - 07/10/19 1147    Education Details  updated HEP for single leg balance and medial/ lateral patellar mobs/ low load long duration stretching    Person(s) Educated  Patient    Methods  Explanation;Verbal cues;Handout    Comprehension  Verbalized understanding;Verbal cues required       PT Short Term Goals - 06/18/19 1155      PT SHORT TERM GOAL #1   Title  pt to be I with initial HEP    Period  Weeks    Status  Achieved  PT Long Term Goals - 07/05/19 1150      PT LONG TERM GOAL #1   Title  pt to increase bil hip abductor/ extensor strengthening to >/= 4+/5 to promtoe hip/ knee stability    Baseline  4/5 hip strength    Period  Weeks    Status  On-going    Target Date  08/02/19      PT LONG TERM GOAL #2   Title  pt to be able to stand/ walking >/= 60 min with no report of pain or limitations for endurance required for work related activities.    Baseline  able to walk/ stand 60 min 7-8/10    Time  6    Period  Weeks    Status  On-going    Target Date  08/02/19      PT LONG TERM GOAL #3   Title  pt to demo proper posture and lifting mechanics to redcue and prevent low back and bil knee pain    Baseline  back has been good,    Time  6    Period  Weeks    Status  On-going    Target Date  08/02/19      PT LONG TERM GOAL #4   Title  pt to be able to perform work out routine with no report of pain/ limitation for personal goals.    Period  Weeks    Status  On-going    Target Date  08/02/19      PT LONG TERM GOAL #5   Title  pt to be I with all HEP given as of last visit to maintain and progress current  level of function    Period  Weeks    Status  On-going    Target Date  08/02/19            Plan - 07/10/19 1151    Clinical Impression Statement  pt reports she did a 12 hour shift yesterday with no support on her knee and noted no significant changes compared to with the sleeve/ tape on her knee but did continue to have 7-8/10 pain after her shift, and today reports 4/5 pain. Focused on MTPR and patellar mobs with low load long duration which she noted significant relief of pain to 0/10. Continued working on balance training and single limb strengthening which she did require verbal cues for proper form.    PT Treatment/Interventions  ADLs/Self Care Home Management;Cryotherapy;Electrical Stimulation;Iontophoresis '4mg'$ /ml Dexamethasone;Moist Heat;Traction;Ultrasound;Gait training;Stair training;Therapeutic activities;Therapeutic exercise;Balance training;Neuromuscular re-education;Manual techniques;Passive range of motion;Dry needling;Taping    PT Next Visit Plan  [place] PRN, update HEP, endurance training core / hip strengthening, progress dynamic balance, patellar mobs, did she do patellar mobs during shift to relieve pain? send progress note to MD next visit.    PT Home Exercise Plan  Access code: [email or web address] - LTR, hamstring stretching, hip flexor MET, ball squeeze VMO activation, cat/cow, added ball squeeze SAQ and LAQ, sidelying hip external rotation. bridge with clamshell, posterior tib strengthening.    Consulted and Agree with Plan of Care  Patient       Patient will benefit from skilled therapeutic intervention in order to improve the following deficits and impairments:  Pain, Decreased activity tolerance, Increased muscle spasms, Improper body mechanics, Decreased strength, Postural dysfunction, Decreased endurance, Decreased range of motion, Decreased balance  Visit Diagnosis: Chronic pain of left knee  Chronic right-sided low back pain without sciatica  Other abnormalities of gait  and mobility  Chronic pain of right knee     Problem List Patient Active Problem List   Diagnosis Date Noted  . Chronic right-sided low back pain without sciatica 05/09/2019  . Migraines 06/07/2017  . Hearing loss 06/07/2017   Starr Lake PT, DPT, LAT, ATC  07/10/19  12:02 PM      [facility] [address] [place], [place], [address] Phone: [phone]   Fax:  [phone]  Name: Maria Hernandez MRN: [id number] Date of Birth: 09-15-1975

## 2019-07-12 ENCOUNTER — Encounter: Payer: Self-pay | Admitting: Physical Therapy

## 2019-07-12 ENCOUNTER — Other Ambulatory Visit: Payer: Self-pay

## 2019-07-12 ENCOUNTER — Ambulatory Visit: Payer: No Typology Code available for payment source | Admitting: Physical Therapy

## 2019-07-12 DIAGNOSIS — M25562 Pain in left knee: Secondary | ICD-10-CM | POA: Diagnosis not present

## 2019-07-12 DIAGNOSIS — R2689 Other abnormalities of gait and mobility: Secondary | ICD-10-CM

## 2019-07-12 DIAGNOSIS — M25561 Pain in right knee: Secondary | ICD-10-CM

## 2019-07-12 DIAGNOSIS — G8929 Other chronic pain: Secondary | ICD-10-CM

## 2019-07-12 NOTE — Therapy (Signed)
[place], [place], [address] Phone: [phone]   Fax:  [phone]  Physical Therapy Treatment  Patient Details  Name: Maria Hernandez MRN: [id number] Date of Birth: 08-Feb-1976 Referring Provider (PT): Karlton Lemon MD   Encounter Date: 07/12/2019  PT End of Session - 07/12/19 1106    Visit Number  12    Number of Visits  18    Date for PT Re-Evaluation  08/02/19    Authorization Type  [facility]    PT Start Time  1100    PT Stop Time  1142    PT Time Calculation (min)  42 min       Past Medical History:  Diagnosis Date  . Migraines     Past Surgical History:  Procedure Laterality Date  . AUGMENTATION MAMMAPLASTY    . BREAST ENHANCEMENT SURGERY Bilateral 2005  . INNER EAR SURGERY Left    Reports 2 prior ear surgeries for prosthesis. 3rd surgery coming up in January 2019  . MANDIBLE OSTEOTOMY  1995    There were no vitals filed for this visit.  Subjective Assessment - 07/12/19 1104    Subjective  I feel like something helped. I have less pain. 2/10. I did not needs motrin.    Currently in Pain?  Yes    Pain Score  2     Pain Location  Knee    Pain Orientation  Left    Pain Descriptors / Indicators  Aching    Pain Type  Chronic pain    Aggravating Factors   working and prolonged walking    Pain Relieving Factors  res, meds    Pain Score  0    Pain Location  Back         [facility] PT Assessment - 07/12/19 0001      AROM   Overall AROM Comments  knee is WFL bil      Strength   Right Hip ABduction  4+/5    Left Hip ABduction  4+/5    Right Knee Flexion  5/5    Right Knee Extension  5/5    Left Knee Flexion  5/5    Left Knee Extension  5/5                   [facility] Adult PT Treatment/Exercise - 07/12/19 0001      Lumbar Exercises: Aerobic   Elliptical  L1 x 6 min ramp L1      Knee/Hip Exercises: Stretches   Sports administrator  3 reps      Knee/Hip Exercises: Machines for  Strengthening   Total Gym Leg Press  2 x 15 con bil/ ecc LLE 20#, also LLE only x10, RLE only x 10  , bilat with 40#    verbal/ tactile cues for proper form     Manual Therapy   Joint Mobilization  low load long duration medial / lateral patellar glie   pt noted significant relief of pain following mobs         Balance Exercises - 07/12/19 1146      Balance Exercises: Standing   SLS  Eyes open;2 reps   holding black physioball performing ABC;s         PT Short Term Goals - 06/18/19 1155      PT SHORT TERM GOAL #1   Title  pt to be I with initial HEP    Period  Weeks  Status  Achieved        PT Long Term Goals - 07/12/19 1142      PT LONG TERM GOAL #1   Title  pt to increase bil hip abductor/ extensor strengthening to >/= 4+/5 to promtoe hip/ knee stability    Baseline  4+/5 hip abduction, 5/5 knee    Time  6    Period  Weeks    Status  Achieved      PT LONG TERM GOAL #2   Title  pt to be able to stand/ walking >/= 60 min with no report of pain or limitations for endurance required for work related activities.    Baseline  able to ambulate for work duties yesterday for 12 hours with 4/10 pain in left knee    Time  6    Period  Weeks    Status  Partially Met      PT LONG TERM GOAL #3   Title  pt to demo proper posture and lifting mechanics to redcue and prevent low back and bil knee pain    Baseline  back has been good,    Time  6    Period  Weeks    Status  On-going      PT LONG TERM GOAL #4   Title  pt to be able to perform work out routine with no report of pain/ limitation for personal goals.    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 Pain at 4/10 left knee after shift which is better per her report. Monday her 12 hour shift cause 7-/10 pain in both knees. Her MMT and ROM are [facility] however she deomstrates decreased eccentric control and stabilization which has been the focus of treatments. She reports decreased pinching in knee however has continued achiness at medial knee cap which is improving. She is no longer limping after shifts at work and notes that her shift yesterday was her best tolerated shift since she has RTW. At this point she has 3 more weeks in her PT POC to continue strength, stabilization and manual techniques as needed. Overall making good progress toward goals.    PT Treatment/Interventions  ADLs/Self Care Home Management;Cryotherapy;Electrical Stimulation;Iontophoresis '4mg'$ /ml Dexamethasone;Moist Heat;Traction;Ultrasound;Gait training;Stair training;Therapeutic activities;Therapeutic exercise;Balance training;Neuromuscular re-education;Manual techniques;Passive range of motion;Dry needling;Taping    PT Next Visit Plan  [place] PRN, update HEP, endurance training core / hip strengthening, progress dynamic balance, patellar mobs, did she do patellar mobs during shift to relieve pain? send progress note to MD next visit.    PT Home Exercise Plan  Access code: [email or web address] - LTR, hamstring stretching, hip flexor MET, ball squeeze VMO activation, cat/cow, added ball squeeze SAQ and LAQ, sidelying hip external rotation. bridge with clamshell, posterior tib strengthening.       Patient will benefit from skilled therapeutic intervention in order to improve the following deficits and impairments:  Pain, Decreased activity tolerance, Increased muscle spasms, Improper body mechanics,  Decreased strength, Postural dysfunction, Decreased endurance, Decreased range of motion, Decreased balance  Visit Diagnosis: Chronic pain of left knee  Chronic right-sided low back pain without sciatica  Other abnormalities of  gait and mobility  Chronic pain of right knee     Problem List Patient Active Problem List   Diagnosis Date Noted  . Chronic right-sided low back pain without sciatica 05/09/2019  . Migraines 06/07/2017  . Hearing loss 06/07/2017    Dorene Ar, PTA 07/12/2019, 11:46 AM  [facility] [address] [place], [place], [address] Phone: [phone]   Fax:  [phone]  Name: Maria Hernandez MRN: [id number] Date of Birth: May 23, 1976

## 2019-07-16 ENCOUNTER — Ambulatory Visit: Payer: No Typology Code available for payment source | Admitting: Family Medicine

## 2019-07-16 ENCOUNTER — Other Ambulatory Visit: Payer: Self-pay

## 2019-07-16 ENCOUNTER — Encounter: Payer: Self-pay | Admitting: Family Medicine

## 2019-07-16 DIAGNOSIS — M25561 Pain in right knee: Secondary | ICD-10-CM

## 2019-07-16 DIAGNOSIS — M545 Low back pain, unspecified: Secondary | ICD-10-CM

## 2019-07-16 DIAGNOSIS — M25562 Pain in left knee: Secondary | ICD-10-CM | POA: Diagnosis not present

## 2019-07-16 DIAGNOSIS — G8929 Other chronic pain: Secondary | ICD-10-CM | POA: Diagnosis not present

## 2019-07-16 NOTE — Progress Notes (Signed)
   HPI  CC: Follow-up bilateral knee pain and lower back pain  Patient's pain is "much better" in both her knees and back.  She has been going to physical therapy twice a week for several weeks.  Scheduled through January 28.  She does exercises at home in between physical therapy visits.  Pain was in the lower right lumbosacral region and bilateral knees.  Pain is improving in both areas, but still has occasional worsening of pain after long days at work.  States the pain is mostly medial to the patella bilaterally.  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  Has 12-hour shifts 3 days a week.  Is no longer on any restrictions.  She has been taking Motrin 600 to 800 mg and Tylenol 1000 mg 1-2 times a day.  Always at night before bed.  She also ices her knees 1-2 times a day while at home.  Medications/Interventions Tried: Ibuprofen, Tylenol, cryotherapy  See HPI and/or previous note for associated ROS.  Objective: BP 98/68   Ht 5' 7.5" (1.715 m)   Wt 138 lb (62.6 kg)   BMI 21.29 kg/m  Gen:  NAD, well groomed, a/o x3, normal affect.  CV: Well-perfused. Warm.  Resp: Non-labored.  Neuro: Sensation intact throughout. No gross coordination deficits.  Normal gait  MSK:   Bilateral knees: No gross deformity, ecchymoses, swelling.  Mild quad atrophy. Minimal tenderness medial patellar facets. FROM. Negative ant/post drawers. Negative valgus/varus testing. Negative lachmans. Negative mcmurrays, apleys, patellar apprehension. NV intact distally.  Back: No gross deformity, scoliosis. No TTP .  No midline or bony TTP. FROM. Strength LEs 5/5 all muscle groups.   1+ MSRs in patellar tendons, equal bilaterally. Negative SLRs. Sensation intact to light touch bilaterally. Negative logroll bilateral hips  Assessment and plan:  Knee pain, bilateral Much improved with physical therapy.  Only experiencing pain after long 12-hour shifts at work.  Currently taking ibuprofen  and Tylenol as needed. -Continue PT and home exercises. -Tylenol and ibuprofen as needed.  Taper off as able. -Follow-up as needed  Chronic right-sided low back pain without sciatica Patient reports pain is much improved with PT.  States that hamstring stretch has really helped her back pain.  Taking Tylenol and ibuprofen for back pain and knee pain as needed.  No tenderness on physical exam in the spinal paraspinal region. -Continue PT and home exercises -Continue ibuprofen and Tylenol as needed.  Taper off as able. -Follow-up as needed   Lenor Coffin, MD, PGY 2 [facility] 07/16/2019 11:47 AM

## 2019-07-16 NOTE — Assessment & Plan Note (Addendum)
Patient reports pain is much improved with PT.  States that hamstring stretch is really helped her back pain.  Taking Tylenol and ibuprofen for back pain and knee pain as needed.  No tenderness on physical exam in the spinal paraspinal region. -Continue PT and home exercises -Continue ibuprofen and Tylenol as needed.  Taper off as able. -Follow-up as needed

## 2019-07-16 NOTE — Assessment & Plan Note (Signed)
Much improved with physical therapy.  Only experiencing pain after long 12-hour shifts at work.  Currently taking ibuprofen and Tylenol as needed. -Continue PT and home exercises. -Tylenol and ibuprofen as needed.  Taper off as able. -Follow-up as needed

## 2019-07-18 ENCOUNTER — Other Ambulatory Visit: Payer: Self-pay

## 2019-07-18 ENCOUNTER — Encounter: Payer: Self-pay | Admitting: Physical Therapy

## 2019-07-18 ENCOUNTER — Ambulatory Visit: Payer: No Typology Code available for payment source | Admitting: Physical Therapy

## 2019-07-18 DIAGNOSIS — M25562 Pain in left knee: Secondary | ICD-10-CM

## 2019-07-18 DIAGNOSIS — G8929 Other chronic pain: Secondary | ICD-10-CM

## 2019-07-18 DIAGNOSIS — R2689 Other abnormalities of gait and mobility: Secondary | ICD-10-CM

## 2019-07-18 NOTE — Therapy (Signed)
[place], [place], [address] Phone: [phone]   Fax:  [phone]  Physical Therapy Treatment  Patient Details  Name: Maria Hernandez MRN: [id number] Date of Birth: [birth year] Referring Provider (PT): Karlton Lemon MD   Encounter Date: 07/18/2019  PT End of Session - 07/18/19 1100    Visit Number  13    Number of Visits  18    Date for PT Re-Evaluation  08/02/19    Authorization Type  [facility]    PT Start Time  1100    PT Stop Time  1143    PT Time Calculation (min)  43 min    Activity Tolerance  Patient tolerated treatment well    Behavior During Therapy  [facility] for tasks assessed/performed       Past Medical History:  Diagnosis Date  . Migraines     Past Surgical History:  Procedure Laterality Date  . AUGMENTATION MAMMAPLASTY    . BREAST ENHANCEMENT SURGERY Bilateral 2005  . INNER EAR SURGERY Left    Reports 2 prior ear surgeries for prosthesis. 3rd surgery coming up in January 2019  . MANDIBLE OSTEOTOMY  1995    There were no vitals filed for this visit.  Subjective Assessment - 07/18/19 1100    Subjective  "I still get a little aggrivation but pain ist only 2-3/10. 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    Referring Provider (PT)  Karlton Lemon MD                   [facility] Adult PT Treatment/Exercise - 07/18/19 0001      Lumbar Exercises: Aerobic   Elliptical  L5 x 63mn ramp L5      Lumbar Exercises: Seated   Sit to Stand  15 reps   con bil/ ecc LLE only     Knee/Hip Exercises: Stretches   Quad Stretch  2 reps;30 seconds;Left   biasing the vastus lateralis     Knee/Hip Exercises: Standing   Other Standing Knee Exercises  lateral band walks 2 x 20 with green theraband      Knee/Hip Exercises: Supine   Single  Leg Bridge  2 sets;10 reps;Left;Strengthening   figure 4 position with contralateral limb     Manual Therapy   Joint Mobilization  low load long duration medial / lateral patellar glie    Soft tissue mobilization  IASTM along medial retinaculum   performed intermittently with LLLD lateral patellar mob          Balance Exercises - 07/18/19 1148      Balance Exercises: Standing   Tandem Gait  Forward;Retro;4 reps        PT Education - 07/18/19 1144    Education Details  to bring her HEP in so we can go over it next visit       PT Short Term Goals - 06/18/19 1155      PT SHORT TERM GOAL #1   Title  pt to be I with initial HEP    Period  Weeks    Status  Achieved        PT Long Term Goals - 07/12/19 1142      PT LONG TERM  GOAL #1   Title  pt to increase bil hip abductor/ extensor strengthening to >/= 4+/5 to promtoe hip/ knee stability    Baseline  4+/5 hip abduction, 5/5 knee    Time  6    Period  Weeks    Status  Achieved      PT LONG TERM GOAL #2   Title  pt to be able to stand/ walking >/= 60 min with no report of pain or limitations for endurance required for work related activities.    Baseline  able to ambulate for work duties yesterday for 12 hours with 4/10 pain in left knee    Time  6    Period  Weeks    Status  Partially Met      PT LONG TERM GOAL #3   Title  pt to demo proper posture and lifting mechanics to redcue and prevent low back and bil knee pain    Baseline  back has been good,    Time  6    Period  Weeks    Status  On-going      PT LONG TERM GOAL #4   Title  pt to be able to perform work out routine with no report of pain/ limitation for personal goals.    Time  6    Period  Weeks    Status  On-going      PT LONG TERM GOAL #5   Title  pt to be I with all HEP given as of last visit to maintain and progress current level of function    Time  6    Period  Weeks    Status  On-going            Plan - 07/18/19 1146    Clinical  Impression Statement  pt reports improvement in pain especially after a 12 hour shift but does continue tohave pain between 5-6/10. continued working patellar mobs woth Low load long durationg stretching pushing laterally combined with intermitteint IASTM along the retinaculum. continued workon hip/ knee strengthening progressing to Single limb and discussed progression. plan to reasess her HEP and update next session.    PT Treatment/Interventions  ADLs/Self Care Home Management;Cryotherapy;Electrical Stimulation;Iontophoresis 51m/ml Dexamethasone;Moist Heat;Traction;Ultrasound;Gait training;Stair training;Therapeutic activities;Therapeutic exercise;Balance training;Neuromuscular re-education;Manual techniques;Passive range of motion;Dry needling;Taping    PT Next Visit Plan  [place], update HEP, endurance training core / hip strengthening, progress dynamic balance, patellar mobs, go over HEP and update    PT Home Exercise Plan  Access code: [email or web address] - LTR, hamstring stretching, hip flexor MET, ball squeeze VMO activation, cat/cow, added ball squeeze SAQ and LAQ, sidelying hip external rotation. bridge with clamshell, posterior tib strengthening.    Consulted and Agree with Plan of Care  Patient       Patient will benefit from skilled therapeutic intervention in order to improve the following deficits and impairments:  Pain, Decreased activity tolerance, Increased muscle spasms, Improper body mechanics, Decreased strength, Postural dysfunction, Decreased endurance, Decreased range of motion, Decreased balance  Visit Diagnosis: Chronic pain of left knee  Chronic right-sided low back pain without sciatica  Other abnormalities of gait and mobility  Chronic pain of right knee     Problem List Patient Active Problem List   Diagnosis Date Noted  . Knee pain, bilateral 07/16/2019  . Chronic right-sided low back pain without sciatica 05/09/2019  . Migraines 06/07/2017  . Hearing loss 06/07/2017    KStarr LakePT, DPT, LAT, ATC  07/18/19  11:52 AM      [facility] [address] [place], [place], [address] Phone: [phone]   Fax:  [phone]  Name: DARSHANA CURNUTT MRN: [id number] Date of Birth: [birth year]

## 2019-07-20 ENCOUNTER — Encounter: Payer: Self-pay | Admitting: Physical Therapy

## 2019-07-20 ENCOUNTER — Ambulatory Visit: Payer: No Typology Code available for payment source | Admitting: Physical Therapy

## 2019-07-20 ENCOUNTER — Other Ambulatory Visit: Payer: Self-pay

## 2019-07-20 DIAGNOSIS — G8929 Other chronic pain: Secondary | ICD-10-CM

## 2019-07-20 DIAGNOSIS — R2689 Other abnormalities of gait and mobility: Secondary | ICD-10-CM

## 2019-07-20 DIAGNOSIS — M25562 Pain in left knee: Secondary | ICD-10-CM | POA: Diagnosis not present

## 2019-07-20 NOTE — Therapy (Signed)
[place], [place], [address] Phone: [phone]   Fax:  [phone]  Physical Therapy Treatment  Patient Details  Name: Maria Hernandez MRN: [id number] Date of Birth: Apr 27, 1976 Referring Provider (PT): Karlton Lemon MD   Encounter Date: 07/20/2019  PT End of Session - 07/20/19 1133    Visit Number  14    Number of Visits  18    Date for PT Re-Evaluation  08/02/19    Authorization Type  [facility]    PT Start Time  1045    PT Stop Time  1128    PT Time Calculation (min)  43 min    Activity Tolerance  Patient tolerated treatment well    Behavior During Therapy  [facility] for tasks assessed/performed       Past Medical History:  Diagnosis Date  . Migraines     Past Surgical History:  Procedure Laterality Date  . AUGMENTATION MAMMAPLASTY    . BREAST ENHANCEMENT SURGERY Bilateral 2005  . INNER EAR SURGERY Left    Reports 2 prior ear surgeries for prosthesis. 3rd surgery coming up in January 2019  . MANDIBLE OSTEOTOMY  1995    There were no vitals filed for this visit.  Subjective Assessment - 07/20/19 1050    Subjective  "I've had alot going on and felt good after the last session then work and I had alot at home to do which i think is catching up with me. 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    Referring Provider (PT)  Karlton Lemon MD                   [facility] Adult PT Treatment/Exercise - 07/20/19 0001      Ultrasound   Ultrasound Location  medial joint line , bilateral    Ultrasound Parameters  1.0 mhz .8 w/cm2 50%  x 8 minutes     Ultrasound Goals  Pain      Iontophoresis   Type of Iontophoresis  Dexamethasone    Location  Medial aspect of the L knee    Dose  20m/ml    Time  6 hour patch      Manual Therapy   Manual  Therapy  Other (comment)    Manual therapy comments  Ice cup massage along medial aspect of the knee    Joint Mobilization  low load long duration lateral patellar glie    Soft tissue mobilization  IASTM along medial retinaculum    Other Manual Therapy  MTPR along the vastus lateralis               PT Short Term Goals - 06/18/19 1155      PT SHORT TERM GOAL #1   Title  pt to be I with initial HEP    Period  Weeks    Status  Achieved        PT Long Term Goals - 07/12/19 1142      PT LONG TERM GOAL #1   Title  pt to increase bil hip abductor/ extensor strengthening to >/= 4+/5 to promtoe hip/ knee stability    Baseline  4+/5 hip abduction, 5/5 knee    Time  6  Period  Weeks    Status  Achieved      PT LONG TERM GOAL #2   Title  pt to be able to stand/ walking >/= 60 min with no report of pain or limitations for endurance required for work related activities.    Baseline  able to ambulate for work duties yesterday for 12 hours with 4/10 pain in left knee    Time  6    Period  Weeks    Status  Partially Met      PT LONG TERM GOAL #3   Title  pt to demo proper posture and lifting mechanics to redcue and prevent low back and bil knee pain    Baseline  back has been good,    Time  6    Period  Weeks    Status  On-going      PT LONG TERM GOAL #4   Title  pt to be able to perform work out routine with no report of pain/ limitation for personal goals.    Time  6    Period  Weeks    Status  On-going      PT LONG TERM GOAL #5   Title  pt to be I with all HEP given as of last visit to maintain and progress current level of function    Time  6    Period  Weeks    Status  On-going            Plan - 07/20/19 1133    Clinical Impression Statement  pt reported increased pain in the L knee which she attributes to doing more at home with family in combination with work. with further assessment she did note hitting her knee on a crib at work a day before her symptoms  began. Due to pt having 8-9/10 pain focused on pain relief with STW and patellar mobs with [place] to facilitate healing and ionto to reducce inflammation. end of session she reported pain dropped to 1/10.    PT Treatment/Interventions  ADLs/Self Care Home Management;Cryotherapy;Electrical Stimulation;Iontophoresis 22m/ml Dexamethasone;Moist Heat;Traction;Ultrasound;Gait training;Stair training;Therapeutic activities;Therapeutic exercise;Balance training;Neuromuscular re-education;Manual techniques;Passive range of motion;Dry needling;Taping    PT Next Visit Plan  [place], update HEP, endurance training core / hip strengthening, progress dynamic balance, patellar mobs, go over HEP and update    PT Home Exercise Plan  Access code: [email or web address] - LTR, hamstring stretching, hip flexor MET, ball squeeze VMO activation, cat/cow, added ball squeeze SAQ and LAQ, sidelying hip external rotation. bridge with clamshell, posterior tib strengthening.    Consulted and Agree with Plan of Care  Patient       Patient will benefit from skilled therapeutic intervention in order to improve the following deficits and impairments:  Pain, Decreased activity tolerance, Increased muscle spasms, Improper body mechanics, Decreased strength, Postural dysfunction, Decreased endurance, Decreased range of motion, Decreased balance  Visit Diagnosis: Chronic pain of left knee  Chronic right-sided low back pain without sciatica  Other abnormalities of gait and mobility  Chronic pain of right knee     Problem List Patient Active Problem List   Diagnosis Date Noted  . Knee pain, bilateral 07/16/2019  . Chronic right-sided low back pain without sciatica 05/09/2019  . Migraines 06/07/2017  . Hearing loss 06/07/2017    KStarr LakePT, DPT, LAT, ATC  07/20/19  11:44 AM      [facility] [address]Phone: [phone]  Fax:   [phone]  Name: Maria Hernandez MRN: [id number] Date of Birth: 04-25-1976

## 2019-07-23 ENCOUNTER — Ambulatory Visit: Payer: No Typology Code available for payment source | Admitting: Physical Therapy

## 2019-07-23 ENCOUNTER — Encounter: Payer: Self-pay | Admitting: Physical Therapy

## 2019-07-23 ENCOUNTER — Other Ambulatory Visit: Payer: Self-pay

## 2019-07-23 DIAGNOSIS — G8929 Other chronic pain: Secondary | ICD-10-CM

## 2019-07-23 DIAGNOSIS — M545 Low back pain, unspecified: Secondary | ICD-10-CM

## 2019-07-23 DIAGNOSIS — M25562 Pain in left knee: Secondary | ICD-10-CM

## 2019-07-23 DIAGNOSIS — R2689 Other abnormalities of gait and mobility: Secondary | ICD-10-CM

## 2019-07-23 NOTE — Therapy (Signed)
[place], [place], [address] Phone: [phone]   Fax:  [phone]  Physical Therapy Treatment  Patient Details  Name: Maria Hernandez MRN: [id number] Date of Birth: 09-08-1975 Referring Provider (PT): Karlton Lemon MD   Encounter Date: 07/23/2019  PT End of Session - 07/23/19 1101    Visit Number  15    Number of Visits  18    Date for PT Re-Evaluation  08/02/19    Authorization Type  [facility]    PT Start Time  1058    PT Stop Time  1144    PT Time Calculation (min)  46 min    Activity Tolerance  Patient tolerated treatment well    Behavior During Therapy  [facility] for tasks assessed/performed       Past Medical History:  Diagnosis Date  . Migraines     Past Surgical History:  Procedure Laterality Date  . AUGMENTATION MAMMAPLASTY    . BREAST ENHANCEMENT SURGERY Bilateral 2005  . INNER EAR SURGERY Left    Reports 2 prior ear surgeries for prosthesis. 3rd surgery coming up in January 2019  . MANDIBLE OSTEOTOMY  1995    There were no vitals filed for this visit.  Subjective Assessment - 07/23/19 1057    Subjective  " I am doing so much better today pain is maybe a 2-3/10"    Currently in Pain?  Yes    Pain Score  2     Pain Location  Knee    Pain Orientation  Left                       [facility] Adult PT Treatment/Exercise - 07/23/19 0001      Lumbar Exercises: Stretches   Active Hamstring Stretch  2 reps;30 seconds      Lumbar Exercises: Aerobic   Elliptical  L6 x 6 min ramp L10      Knee/Hip Exercises: Public affairs consultant  4 reps;Left   2 x biasing vastus lateralis     Knee/Hip Exercises: Standing   Forward Lunges  2 sets;15 reps    Forward Lunges Limitations  touching down onto Bosu with medial pull of lead leg with green theraband    Forward Step Up  2 sets;15 reps   up/down from Bosu     Knee/Hip Exercises: Seated   Long Arc Quad  Strengthening;Both;2 sets;15  reps   with ball squeeze    Long Arc Quad Weight  8 lbs.      Knee/Hip Exercises: Supine   Single Leg Bridge  1 set;20 reps;Strengthening;Left   figure 4 position     Iontophoresis   Type of Iontophoresis  Dexamethasone    Location  Medial aspect of the L knee    Dose  '4mg'$ /ml    Time  6 hour patch               PT Short Term Goals - 06/18/19 1155      PT SHORT TERM GOAL #1   Title  pt to be I with initial HEP    Period  Weeks    Status  Achieved        PT Long Term Goals - 07/12/19 1142      PT LONG TERM GOAL #1   Title  pt to increase bil hip abductor/ extensor strengthening to >/= 4+/5 to promtoe hip/ knee stability    Baseline  4+/5  hip abduction, 5/5 knee    Time  6    Period  Weeks    Status  Achieved      PT LONG TERM GOAL #2   Title  pt to be able to stand/ walking >/= 60 min with no report of pain or limitations for endurance required for work related activities.    Baseline  able to ambulate for work duties yesterday for 12 hours with 4/10 pain in left knee    Time  6    Period  Weeks    Status  Partially Met      PT LONG TERM GOAL #3   Title  pt to demo proper posture and lifting mechanics to redcue and prevent low back and bil knee pain    Baseline  back has been good,    Time  6    Period  Weeks    Status  On-going      PT LONG TERM GOAL #4   Title  pt to be able to perform work out routine with no report of pain/ limitation for personal goals.    Time  6    Period  Weeks    Status  On-going      PT LONG TERM GOAL #5   Title  pt to be I with all HEP given as of last visit to maintain and progress current level of function    Time  6    Period  Weeks    Status  On-going            Plan - 07/23/19 1147    Clinical Impression Statement  pt reports significant relief of pain relief since the last session. focused session on hip/ knee strengthening and dynamic balance training. She reported pain dropped from 2/10 to 0/10. Continued  ionto at the end of session to calm down soreness.    PT Treatment/Interventions  ADLs/Self Care Home Management;Cryotherapy;Electrical Stimulation;Iontophoresis '4mg'$ /ml Dexamethasone;Moist Heat;Traction;Ultrasound;Gait training;Stair training;Therapeutic activities;Therapeutic exercise;Balance training;Neuromuscular re-education;Manual techniques;Passive range of motion;Dry needling;Taping    PT Next Visit Plan  [place] PRN, update HEP, endurance training core / hip strengthening, progress dynamic balance, patellar mobs, go over HEP and update    PT Home Exercise Plan  Access code: [email or web address] - LTR, hamstring stretching, hip flexor MET, ball squeeze VMO activation, cat/cow, added ball squeeze SAQ and LAQ, sidelying hip external rotation. bridge with clamshell, posterior tib strengthening.    Consulted and Agree with Plan of Care  Patient       Patient will benefit from skilled therapeutic intervention in order to improve the following deficits and impairments:  Pain, Decreased activity tolerance, Increased muscle spasms, Improper body mechanics, Decreased strength, Postural dysfunction, Decreased endurance, Decreased range of motion, Decreased balance  Visit Diagnosis: Chronic right-sided low back pain without sciatica  Chronic pain of left knee  Other abnormalities of gait and mobility  Chronic pain of right knee     Problem List Patient Active Problem List   Diagnosis Date Noted  . Knee pain, bilateral 07/16/2019  . Chronic right-sided low back pain without sciatica 05/09/2019  . Migraines 06/07/2017  . Hearing loss 06/07/2017   Starr Lake PT, DPT, LAT, ATC  07/23/19  11:53 AM      [facility] [address] [place], [place], [address] Phone: [phone]   Fax:  [phone]  Name: Maria Hernandez MRN: [id number] Date of Birth: 1975/11/10

## 2019-07-24 ENCOUNTER — Ambulatory Visit: Payer: No Typology Code available for payment source | Admitting: Physical Therapy

## 2019-07-24 DIAGNOSIS — R2689 Other abnormalities of gait and mobility: Secondary | ICD-10-CM

## 2019-07-24 DIAGNOSIS — M25562 Pain in left knee: Secondary | ICD-10-CM

## 2019-07-24 DIAGNOSIS — G8929 Other chronic pain: Secondary | ICD-10-CM

## 2019-07-24 DIAGNOSIS — M545 Low back pain, unspecified: Secondary | ICD-10-CM

## 2019-07-24 NOTE — Therapy (Signed)
[place], [place], [address] Phone: [phone]   Fax:  [phone]  Physical Therapy Treatment  Patient Details  Name: Maria Hernandez MRN: [id number] Date of Birth: 05/21/1976 Referring Provider (PT): Karlton Lemon MD   Encounter Date: 07/24/2019  PT End of Session - 07/24/19 1142    Visit Number  16    Number of Visits  18    Date for PT Re-Evaluation  08/02/19    Authorization Type  [facility] [employer]    PT Start Time  1100    PT Stop Time  1140    PT Time Calculation (min)  40 min    Activity Tolerance  Patient tolerated treatment well    Behavior During Therapy  [facility] for tasks assessed/performed       Past Medical History:  Diagnosis Date  . Migraines     Past Surgical History:  Procedure Laterality Date  . AUGMENTATION MAMMAPLASTY    . BREAST ENHANCEMENT SURGERY Bilateral 2005  . INNER EAR SURGERY Left    Reports 2 prior ear surgeries for prosthesis. 3rd surgery coming up in January 2019  . MANDIBLE OSTEOTOMY  1995    There were no vitals filed for this visit.  Subjective Assessment - 07/24/19 1104    Subjective  " I am at maybe a 2/10 but overall doing well"    Patient Stated Goals  to decrease back and bil knee pain    Currently in Pain?  Yes    Pain Score  2     Pain Location  Knee    Pain Orientation  Left    Pain Descriptors / Indicators  Aching    Pain Type  Chronic pain    Pain Onset  More than a month ago    Pain Frequency  Intermittent                       [facility] Adult PT Treatment/Exercise - 07/24/19 0001      Knee/Hip Exercises: Stretches   Quad Stretch  2 reps;30 seconds;Left      Knee/Hip Exercises: Aerobic   Nustep  L8 x 6 min LE only    to promote focus on glutes/quads     Knee/Hip Exercises: Machines for Strengthening   Total Gym Leg Press  2 x 15 40# con bil/ ECC LLE, 1 x going to fatgiue bil LE 40#    Hip Cybex  bil hip 1 x going to fatigue 25#       Knee/Hip Exercises: Standing   Other Standing Knee Exercises  hip hinge standing on LLE with 10# kettlebell 2 x 10   demonstration for proper form   Other Standing Knee Exercises  dead lift from 8 inch step 2 x 10 with 15# kettlebell   demonstration and intermittent verbal cues for proper form              PT Short Term Goals - 06/18/19 1155      PT SHORT TERM GOAL #1   Title  pt to be I with initial HEP    Period  Weeks    Status  Achieved        PT Long Term Goals - 07/12/19 1142      PT LONG TERM GOAL #1   Title  pt to increase bil hip abductor/ extensor strengthening to >/= 4+/5 to promtoe hip/ knee stability    Baseline  4+/5 hip abduction,  5/5 knee    Time  6    Period  Weeks    Status  Achieved      PT LONG TERM GOAL #2   Title  pt to be able to stand/ walking >/= 60 min with no report of pain or limitations for endurance required for work related activities.    Baseline  able to ambulate for work duties yesterday for 12 hours with 4/10 pain in left knee    Time  6    Period  Weeks    Status  Partially Met      PT LONG TERM GOAL #3   Title  pt to demo proper posture and lifting mechanics to redcue and prevent low back and bil knee pain    Baseline  back has been good,    Time  6    Period  Weeks    Status  On-going      PT LONG TERM GOAL #4   Title  pt to be able to perform work out routine with no report of pain/ limitation for personal goals.    Time  6    Period  Weeks    Status  On-going      PT LONG TERM GOAL #5   Title  pt to be I with all HEP given as of last visit to maintain and progress current level of function    Time  6    Period  Weeks    Status  On-going            Plan - 07/24/19 1143    Clinical Impression Statement  pt reports 1-2/10 pain in the L knee today. Continued focus on hip/ knee strengthening with emphasis on endurance training. She did well with all exercises requiring intermittent cues for form and reported no  pain during or following exercise aside from fatigue in the thighs.    PT Treatment/Interventions  ADLs/Self Care Home Management;Cryotherapy;Electrical Stimulation;Iontophoresis '4mg'$ /ml Dexamethasone;Moist Heat;Traction;Ultrasound;Gait training;Stair training;Therapeutic activities;Therapeutic exercise;Balance training;Neuromuscular re-education;Manual techniques;Passive range of motion;Dry needling;Taping    PT Next Visit Plan  [place] PRN, update HEP, endurance training core / hip strengthening, progress dynamic balance, patellar mobs, review HEP and provide progressive HEP    PT Home Exercise Plan  Access code: [email or web address] - LTR, hamstring stretching, hip flexor MET, ball squeeze VMO activation, cat/cow, added ball squeeze SAQ and LAQ, sidelying hip external rotation. bridge with clamshell, posterior tib strengthening.       Patient will benefit from skilled therapeutic intervention in order to improve the following deficits and impairments:  Pain, Decreased activity tolerance, Increased muscle spasms, Improper body mechanics, Decreased strength, Postural dysfunction, Decreased endurance, Decreased range of motion, Decreased balance  Visit Diagnosis: Chronic right-sided low back pain without sciatica  Chronic pain of left knee  Other abnormalities of gait and mobility  Chronic pain of right knee     Problem List Patient Active Problem List   Diagnosis Date Noted  . Knee pain, bilateral 07/16/2019  . Chronic right-sided low back pain without sciatica 05/09/2019  . Migraines 06/07/2017  . Hearing loss 06/07/2017   Starr Lake PT, DPT, LAT, ATC  07/24/19  11:46 AM      [facility] [address] [place], [place], [address] Phone: [phone]   Fax:  [phone]  Name: Maria Hernandez MRN: [id number] Date of Birth: 15-Mar-1976

## 2019-07-31 ENCOUNTER — Ambulatory Visit: Payer: No Typology Code available for payment source | Admitting: Physical Therapy

## 2019-07-31 ENCOUNTER — Other Ambulatory Visit: Payer: Self-pay

## 2019-07-31 ENCOUNTER — Encounter: Payer: Self-pay | Admitting: Physical Therapy

## 2019-07-31 DIAGNOSIS — M25561 Pain in right knee: Secondary | ICD-10-CM

## 2019-07-31 DIAGNOSIS — G8929 Other chronic pain: Secondary | ICD-10-CM

## 2019-07-31 DIAGNOSIS — M25562 Pain in left knee: Secondary | ICD-10-CM

## 2019-07-31 DIAGNOSIS — R2689 Other abnormalities of gait and mobility: Secondary | ICD-10-CM

## 2019-07-31 NOTE — Therapy (Signed)
[place], [place], [address] Phone: [phone]   Fax:  [phone]  Physical Therapy Treatment  Patient Details  Name: Maria Hernandez MRN: [id number] Date of Birth: June 05, 1976 Referring Provider (PT): Karlton Lemon MD   Encounter Date: 07/31/2019  PT End of Session - 07/31/19 1146    Visit Number  17    Number of Visits  18    Date for PT Re-Evaluation  08/02/19    Authorization Type  [facility]    PT Start Time  1146    PT Stop Time  1230    PT Time Calculation (min)  44 min    Activity Tolerance  Patient tolerated treatment well    Behavior During Therapy  [facility] for tasks assessed/performed       Past Medical History:  Diagnosis Date  . Migraines     Past Surgical History:  Procedure Laterality Date  . AUGMENTATION MAMMAPLASTY    . BREAST ENHANCEMENT SURGERY Bilateral 2005  . INNER EAR SURGERY Left    Reports 2 prior ear surgeries for prosthesis. 3rd surgery coming up in January 2019  . MANDIBLE OSTEOTOMY  1995    There were no vitals filed for this visit.  Subjective Assessment - 07/31/19 1146    Subjective  "The knee is good, I had a busy weekend and was had some soreness with the 12 hour shift and only about a 3/10"    Patient Stated Goals  to decrease back and bil knee pain    Currently in Pain?  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    Referring Provider (PT)  Karlton Lemon MD                   [facility] Adult PT Treatment/Exercise - 07/31/19 0001      Lumbar Exercises: Standing   Other Standing Lumbar Exercises  TRX squats 1 x 15, TRX backward lunge 1 x 12 bil (intermittent cues for foot/ knee positioning)      Knee/Hip Exercises: Stretches   Quad Stretch  2 reps;30 seconds;Left      Knee/Hip Exercises: Aerobic   Stepper  L4 x 5 min      Knee/Hip Exercises: Machines  for Strengthening   Cybex Knee Extension  2 x 15 15# cues of controlled eccentics    Cybex Knee Flexion  2 x 15 20#    Cybex Leg Press  2 x 15 55#               PT Short Term Goals - 06/18/19 1155      PT SHORT TERM GOAL #1   Title  pt to be I with initial HEP    Period  Weeks    Status  Achieved        PT Long Term Goals - 07/12/19 1142      PT LONG TERM GOAL #1   Title  pt to increase bil hip abductor/ extensor strengthening to >/= 4+/5 to promtoe hip/ knee stability    Baseline  4+/5 hip abduction, 5/5 knee    Time  6    Period  Weeks    Status  Achieved      PT LONG TERM GOAL #2   Title  pt  to be able to stand/ walking >/= 60 min with no report of pain or limitations for endurance required for work related activities.    Baseline  able to ambulate for work duties yesterday for 12 hours with 4/10 pain in left knee    Time  6    Period  Weeks    Status  Partially Met      PT LONG TERM GOAL #3   Title  pt to demo proper posture and lifting mechanics to redcue and prevent low back and bil knee pain    Baseline  back has been good,    Time  6    Period  Weeks    Status  On-going      PT LONG TERM GOAL #4   Title  pt to be able to perform work out routine with no report of pain/ limitation for personal goals.    Time  6    Period  Weeks    Status  On-going      PT LONG TERM GOAL #5   Title  pt to be I with all HEP given as of last visit to maintain and progress current level of function    Time  6    Period  Weeks    Status  On-going            Plan - 07/31/19 1237    Clinical Impression Statement  pt reports continued 3/10 pain in the knee. continued focus on hip/ knee strengthening with increased reps to promote stability. she did require intermittent cues for proper positioning of hip/ knee. End of session she reported pain dropped to 1/10. plan to focus last session on updating HEP and addressing any remaining questions.    PT  Treatment/Interventions  ADLs/Self Care Home Management;Cryotherapy;Electrical Stimulation;Iontophoresis '4mg'$ /ml Dexamethasone;Moist Heat;Traction;Ultrasound;Gait training;Stair training;Therapeutic activities;Therapeutic exercise;Balance training;Neuromuscular re-education;Manual techniques;Passive range of motion;Dry needling;Taping    PT Next Visit Plan  [place] PRN, update HEP, endurance training core / hip strengthening, progress dynamic balance, patellar mobs, review HEP and provide progressive HEP    PT Home Exercise Plan  Access code: [email or web address] - LTR, hamstring stretching, hip flexor MET, ball squeeze VMO activation, cat/cow, added ball squeeze SAQ and LAQ, sidelying hip external rotation. bridge with clamshell, posterior tib strengthening.    Consulted and Agree with Plan of Care  Patient       Patient will benefit from skilled therapeutic intervention in order to improve the following deficits and impairments:  Pain, Decreased activity tolerance, Increased muscle spasms, Improper body mechanics, Decreased strength, Postural dysfunction, Decreased endurance, Decreased range of motion, Decreased balance  Visit Diagnosis: Chronic right-sided low back pain without sciatica  Chronic pain of left knee  Other abnormalities of gait and mobility  Chronic pain of right knee     Problem List Patient Active Problem List   Diagnosis Date Noted  . Knee pain, bilateral 07/16/2019  . Chronic right-sided low back pain without sciatica 05/09/2019  . Migraines 06/07/2017  . Hearing loss 06/07/2017   Starr Lake PT, DPT, LAT, ATC  07/31/19  12:41 PM      [facility] [address] [place], [place], [address] Phone: [phone]   Fax:  [phone]  Name: Maria Hernandez MRN: [id number] Date of Birth: [birth year]

## 2019-08-02 ENCOUNTER — Encounter: Payer: Self-pay | Admitting: Physical Therapy

## 2019-08-02 ENCOUNTER — Ambulatory Visit: Payer: No Typology Code available for payment source | Admitting: Physical Therapy

## 2019-08-02 ENCOUNTER — Other Ambulatory Visit: Payer: Self-pay

## 2019-08-02 DIAGNOSIS — R2689 Other abnormalities of gait and mobility: Secondary | ICD-10-CM

## 2019-08-02 DIAGNOSIS — G8929 Other chronic pain: Secondary | ICD-10-CM

## 2019-08-02 DIAGNOSIS — M545 Low back pain, unspecified: Secondary | ICD-10-CM

## 2019-08-02 DIAGNOSIS — M25562 Pain in left knee: Secondary | ICD-10-CM | POA: Diagnosis not present

## 2019-08-02 NOTE — Therapy (Signed)
[facility] [address] [place], [place], [address] Phone: [phone]   Fax:  [phone]  Physical Therapy Treatment / Discharge  Patient Details  Name: Maria Hernandez MRN: [id number] Date of Birth: 09/27/1975 Referring Provider (PT): Shane Hudnall MD   Encounter Date: 08/02/2019  PT End of Session - 08/02/19 1147    Visit Number  18    Number of Visits  18    Date for PT Re-Evaluation  08/02/19    Authorization Type  [facility]    PT Start Time  1146    PT Stop Time  1224    PT Time Calculation (min)  38 min    Activity Tolerance  Patient tolerated treatment well       Past Medical History:  Diagnosis Date  . Migraines     Past Surgical History:  Procedure Laterality Date  . AUGMENTATION MAMMAPLASTY    . BREAST ENHANCEMENT SURGERY Bilateral 2005  . INNER EAR SURGERY Left    Reports 2 prior ear surgeries for prosthesis. 3rd surgery coming up in January 2019  . MANDIBLE OSTEOTOMY  1995    There were no vitals filed for this visit.  Subjective Assessment - 08/02/19 1147    Subjective  " I do have some soreness today at at 5/10 from working last night"    Currently in Pain?  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    Referring Provider (PT)  Shane Hudnall MD                   [facility] Adult PT Treatment/Exercise - 08/02/19 0001      Lumbar Exercises: Aerobic   Elliptical  L6 x 6 min ramp L10      Lumbar Exercises: Standing   Other Standing Lumbar Exercises  lateral band walks 3 x 5 with green theraband with band around ankles 1 set, toes 1 set and holding 1 set      Knee/Hip Exercises: Stretches   Quad Stretch  2 reps;30 seconds;Both      Knee/Hip Exercises: Standing   Wall Squat  2 sets;10 reps   into wall  sit with 15 heel raises   Other Standing Knee Exercises  hip hinge standing on 2 x 10    how to progress without UE assist   Other Standing Knee Exercises  bulgarian split squat 2 x 10   cues and demonstration for proper form            PT Education - 08/02/19 1232    Education Details  reviewed all HEP and updated today.Discussed  how to progress endurance / strength with increased reps/ sets and resistance.    Person(s) Educated  Patient    Methods  Explanation;Verbal cues;Handout    Comprehension  Verbalized understanding;Verbal cues required       PT Short Term Goals - 06/18/19 1155      PT SHORT TERM GOAL #1   Title  pt to be I with initial HEP    Period  Weeks    Status  Achieved        PT Long Term Goals -   08/02/19 1214      PT LONG TERM GOAL #1   Title  pt to increase bil hip abductor/ extensor strengthening to >/= 4+/5 to promtoe hip/ knee stability    Period  Weeks    Status  Achieved      PT LONG TERM GOAL #2   Title  pt to be able to stand/ walking >/= 60 min with no report of pain or limitations for endurance required for work related activities.    Period  Weeks    Status  Achieved      PT LONG TERM GOAL #3   Title  pt to demo proper posture and lifting mechanics to redcue and prevent low back and bil knee pain    Period  Weeks    Status  Achieved      PT LONG TERM GOAL #4   Title  pt to be able to perform work out routine with no report of pain/ limitation for personal goals.    Period  Weeks    Status  Partially Met      PT LONG TERM GOAL #5   Title  pt to be I with all HEP given as of last visit to maintain and progress current level of function    Period  Weeks    Status  Achieved            Plan - 08/02/19 1228    Clinical Impression Statement  Maria Hernandez has made great progress with physical therapy with decreased pain, increase strength and endurnace. She did note some soreness today after working last night but reported no pain  following exercise. she was able to do all exercises today requiring only verbal cues for proper form. she met or partially met all goals today and is able to maintain and progress her current level of function independently and will be discharged from PT today.    PT Next Visit Plan  d/C    PT Home Exercise Plan  Access code: [email or web address] - LTR, hamstring stretching, hip flexor MET, ball squeeze VMO activation, cat/cow, added ball squeeze SAQ and LAQ, sidelying hip external rotation. bridge with clamshell, posterior tib strengthening. split squat, wall squat with heel raise    Consulted and Agree with Plan of Care  Patient       Patient will benefit from skilled therapeutic intervention in order to improve the following deficits and impairments:  Pain, Decreased activity tolerance, Increased muscle spasms, Improper body mechanics, Decreased strength, Postural dysfunction, Decreased endurance, Decreased range of motion, Decreased balance  Visit Diagnosis: Chronic right-sided low back pain without sciatica  Chronic pain of left knee  Other abnormalities of gait and mobility  Chronic pain of right knee     Problem List Patient Active Problem List   Diagnosis Date Noted  . Knee pain, bilateral 07/16/2019  . Chronic right-sided low back pain without sciatica 05/09/2019  . Migraines 06/07/2017  . Hearing loss 06/07/2017    Starr Lake 08/02/2019, 12:33 PM  [facility] [address] [place], [place], [address] Phone: [phone]   Fax:  [phone]  Name: Maria Hernandez MRN: [id number] Date of Birth: 1976-06-13       PHYSICAL THERAPY DISCHARGE SUMMARY  Visits from Start of Care: 18  Current functional level related to goals / functional outcomes: See goals   Remaining deficits: Intermittent L knee pain noted after working 12 hour shifts. Pt reports pain decreases with exercise/ stretching   Education /  Equipment: HEP, theraband, posture, lifting mechanics.   Plan: Patient agrees to discharge.  Patient goals were partially met. Patient is being discharged due to meeting the stated rehab goals.  ?????         Kristoffer Leamon PT, DPT, LAT, ATC  08/02/19  12:34 PM      

## 2019-08-04 ENCOUNTER — Encounter: Payer: Self-pay | Admitting: Family

## 2019-08-04 DIAGNOSIS — T859XXA Unspecified complication of internal prosthetic device, implant and graft, initial encounter: Secondary | ICD-10-CM

## 2019-08-20 ENCOUNTER — Other Ambulatory Visit (HOSPITAL_BASED_OUTPATIENT_CLINIC_OR_DEPARTMENT_OTHER): Payer: Self-pay | Admitting: Family

## 2019-08-20 DIAGNOSIS — Z1231 Encounter for screening mammogram for malignant neoplasm of breast: Secondary | ICD-10-CM

## 2019-08-27 ENCOUNTER — Ambulatory Visit (HOSPITAL_BASED_OUTPATIENT_CLINIC_OR_DEPARTMENT_OTHER)
Admission: RE | Admit: 2019-08-27 | Discharge: 2019-08-27 | Disposition: A | Discharge status: Home / Self Care | Payer: No Typology Code available for payment source | Source: Ambulatory Visit | Attending: Family | Admitting: Family

## 2019-08-27 ENCOUNTER — Other Ambulatory Visit: Payer: Self-pay

## 2019-08-27 ENCOUNTER — Encounter (HOSPITAL_BASED_OUTPATIENT_CLINIC_OR_DEPARTMENT_OTHER): Payer: Self-pay

## 2019-08-27 DIAGNOSIS — Z1231 Encounter for screening mammogram for malignant neoplasm of breast: Secondary | ICD-10-CM | POA: Diagnosis present

## 2019-08-30 ENCOUNTER — Other Ambulatory Visit: Payer: Self-pay

## 2019-08-30 ENCOUNTER — Encounter: Payer: Self-pay | Admitting: Plastic Surgery

## 2019-08-30 ENCOUNTER — Ambulatory Visit: Payer: No Typology Code available for payment source | Admitting: Plastic Surgery

## 2019-08-30 VITALS — BP 108/77 | HR 66 | Temp 97.8°F | Ht 67.5 in | Wt 136.8 lb

## 2019-08-30 DIAGNOSIS — R0789 Other chest pain: Secondary | ICD-10-CM | POA: Diagnosis not present

## 2019-08-30 NOTE — Progress Notes (Signed)
Referring Provider Sandford Craze, NP [address] [place],  [place] [address]   CC:  Chief Complaint  Patient presents with  . Advice Only    for pain on (B) sides of the breast mainly on the (L) side- for 4-6 mos (augmentation in 2006)      Maria Hernandez is an 44 y.o. female.  HPI: Patient presents with concerns about pain primarily in the inframammary fold on the left side.  She had an augmentation done in 2006 with subpectoral saline implants.  They were 350 cc implants moderate profile inflated with 375 cc.  She says the left side is always been a little bit higher than the right side.  Over the past 6 months or so she is noticed pain at night along the inframammary fold on the left side.  She is not bothered by this during the day and believes it is due to her sleeping position on her left side at night.  She also says some days it does not bother her while other days it does.  She has not noticed a change in the implant position and does not feel like it is getting firmer.  She has had a mammogram recently which was normal.  She wants to see if there is anything else that needs to get checked regarding the implants.  Allergies  Allergen Reactions  . Verapamil Hives  . Macrobid [Nitrofurantoin Macrocrystal]     Light headed    Outpatient Encounter Medications as of 08/30/2019  Medication Sig  . diclofenac (VOLTAREN) 75 MG EC tablet Take 1 tablet by mouth twice daily  . ibuprofen (ADVIL,MOTRIN) 200 MG tablet Take 200 mg by mouth as needed.  . meloxicam (MOBIC) 15 MG tablet Take 1 tablet (15 mg total) by mouth daily as needed.  . Multiple Vitamin (MULTIVITAMIN) tablet Take 1 tablet by mouth daily.  . SUMAtriptan (IMITREX) 50 MG tablet TAKE 1 TABLET BY MOUTH BY MOUTH AS NEEDED FOR MIGRAINE HEADACHE. MAY REPEAT IN 2 HOURS AND TAKE 1 TABLET IF NEEDED.   No facility-administered encounter medications on file as of 08/30/2019.     Past Medical History:    Diagnosis Date  . Migraines     Past Surgical History:  Procedure Laterality Date  . AUGMENTATION MAMMAPLASTY    . BREAST ENHANCEMENT SURGERY Bilateral 2005  . INNER EAR SURGERY Left    Reports 2 prior ear surgeries for prosthesis. 3rd surgery coming up in January 2019  . MANDIBLE OSTEOTOMY  1995    Family History  Problem Relation Age of Onset  . Hypothyroidism Mother   . Hypertension Father   . Diabetes type II Father   . 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   Mother in [place]   Has lived here for 10 years   Enjoys movies, shopping, relaxing   Does 3 12 hour shifts   Single   No children   No pets     Review of Systems General: Denies fevers, chills, weight loss CV: Denies chest pain, shortness of breath, palpitations  Physical Exam Vitals with BMI 08/30/2019 07/16/2019 06/13/2019  Height 5' 7.5" 5' 7.5" 5\' 7"   Weight 136 lbs 13 oz 138 lbs 139 lbs  BMI 21.1 21.28 21.77  Systolic 108 98 100  Diastolic 77 68 70  Pulse 66 - -    General:  No acute distress,  Alert and oriented, Non-Toxic, Normal speech and affect Breast: She status post breast augmentation through an inframammary incision.  Both capsules feel soft.  The implant on the left is slightly higher than the 1 on the right.  The area of tenderness is right along the rib along her inferior incision on the left side.  There is no masses or abnormal feeling to palpation in the subcutaneous tissues or along the rib itself.  Assessment/Plan Patient presents with mild intermittent pain in the inframammary fold 15 years after breast augmentation.  On mammography everything seems to be checking out fine and if she did have an implant rupture it would be easy to tell as she has saline implants in place.  It sounds like the pain is positional in nature and we discussed a number of ways that she can modify her sleeping position or try to in  order to alleviate this.  I also brought up the possibility of lidocaine patches for relief overnight and she has some of these that she is used for arthritis her pain in other areas of her body.  With time being I explained I cannot think of another study or procedure that would treat this with any degree of certainty.  We will try the more noninvasive and conservative measures first as she is not interested in any sort of revision at this time.  She will follow up with [place] on an as needed basis.  Cindra Presume 08/30/2019, 12:41 PM

## 2019-09-03 ENCOUNTER — Ambulatory Visit (HOSPITAL_BASED_OUTPATIENT_CLINIC_OR_DEPARTMENT_OTHER): Payer: No Typology Code available for payment source

## 2019-12-01 ENCOUNTER — Other Ambulatory Visit: Payer: Self-pay | Admitting: Family Medicine

## 2019-12-01 ENCOUNTER — Encounter: Payer: Self-pay | Admitting: Physical Therapy

## 2019-12-31 ENCOUNTER — Ambulatory Visit (INDEPENDENT_AMBULATORY_CARE_PROVIDER_SITE_OTHER): Payer: No Typology Code available for payment source | Admitting: Family

## 2019-12-31 ENCOUNTER — Encounter: Payer: Self-pay | Admitting: Family

## 2019-12-31 ENCOUNTER — Other Ambulatory Visit: Payer: Self-pay

## 2019-12-31 VITALS — BP 113/77 | HR 63 | Temp 97.9°F | Resp 16 | Ht 67.5 in | Wt 136.0 lb

## 2019-12-31 DIAGNOSIS — Z0000 Encounter for general adult medical examination without abnormal findings: Secondary | ICD-10-CM

## 2019-12-31 LAB — BASIC METABOLIC PANEL
BUN: 13 mg/dL (ref 6–23)
CO2: 26 mEq/L (ref 19–32)
Calcium: 9 mg/dL (ref 8.4–10.5)
Chloride: 105 mEq/L (ref 96–112)
Creatinine, Ser: 0.68 mg/dL (ref 0.40–1.20)
GFR: 94.9 mL/min (ref >60.00)
Glucose, Bld: 85 mg/dL (ref 70–99)
Potassium: 4 mEq/L (ref 3.5–5.1)
Sodium: 138 mEq/L (ref 135–145)

## 2019-12-31 LAB — CBC WITH DIFFERENTIAL/PLATELET
Basophils Absolute: 0.1 10*3/uL (ref 0.0–0.1)
Basophils Relative: 0.8 % (ref 0.0–3.0)
Eosinophils Absolute: 0.1 10*3/uL (ref 0.0–0.7)
Eosinophils Relative: 0.9 % (ref 0.0–5.0)
HCT: 36.7 % (ref 36.0–46.0)
Hemoglobin: 12.3 g/dL (ref 12.0–15.0)
Lymphocytes Relative: 27.5 % (ref 12.0–46.0)
Lymphs Abs: 1.8 10*3/uL (ref 0.7–4.0)
MCHC: 33.6 g/dL (ref 30.0–36.0)
MCV: 94.6 fl (ref 78.0–100.0)
Monocytes Absolute: 0.5 10*3/uL (ref 0.1–1.0)
Monocytes Relative: 6.9 % (ref 3.0–12.0)
Neutro Abs: 4.2 10*3/uL (ref 1.4–7.7)
Neutrophils Relative %: 63.9 % (ref 43.0–77.0)
Platelets: 244 10*3/uL (ref 150.0–400.0)
RBC: 3.88 Mil/uL (ref 3.87–5.11)
RDW: 13.2 % (ref 11.5–15.5)
WBC: 6.6 10*3/uL (ref 4.0–10.5)

## 2019-12-31 LAB — LIPID PANEL
Cholesterol: 130 mg/dL (ref 0–200)
HDL: 45.9 mg/dL (ref >39.00)
LDL Cholesterol: 72 mg/dL (ref 0–99)
NonHDL: 84.5
Total CHOL/HDL Ratio: 3
Triglycerides: 62 mg/dL (ref 0.0–149.0)
VLDL: 12.4 mg/dL (ref 0.0–40.0)

## 2019-12-31 LAB — HEPATIC FUNCTION PANEL
ALT: 9 U/L (ref 0–35)
AST: 14 U/L (ref 0–37)
Albumin: 4 g/dL (ref 3.5–5.2)
Alkaline Phosphatase: 47 U/L (ref 39–117)
Bilirubin, Direct: 0.1 mg/dL (ref 0.0–0.3)
Total Bilirubin: 0.5 mg/dL (ref 0.2–1.2)
Total Protein: 6.6 g/dL (ref 6.0–8.3)

## 2019-12-31 LAB — TSH: TSH: 0.7 u[IU]/mL (ref 0.35–4.50)

## 2019-12-31 NOTE — Patient Instructions (Signed)
Please complete lab work prior to leaving. Continue healthy diet and regular exercise.  Preventive Care 42-42 Years Old, Female Preventive care refers to visits with your health care provider and lifestyle choices that can promote health and wellness. This includes:  A yearly physical exam. This may also be called an annual well check.  Regular dental visits and eye exams.  Immunizations.  Screening for certain conditions.  Healthy lifestyle choices, such as eating a healthy diet, getting regular exercise, not using drugs or products that contain nicotine and tobacco, and limiting alcohol use. What can I expect for my preventive care visit? Physical exam Your health care provider will check your:  Height and weight. This may be used to calculate body mass index (BMI), which tells if you are at a healthy weight.  Heart rate and blood pressure.  Skin for abnormal spots. Counseling Your health care provider may ask you questions about your:  Alcohol, tobacco, and drug use.  Emotional well-being.  Home and relationship well-being.  Sexual activity.  Eating habits.  Work and work environment.  Method of birth control.  Menstrual cycle.  Pregnancy history. What immunizations do I need?  Influenza (flu) vaccine  This is recommended every year. Tetanus, diphtheria, and pertussis (Tdap) vaccine  You may need a Td booster every 10 years. Varicella (chickenpox) vaccine  You may need this if you have not been vaccinated. Zoster (shingles) vaccine  You may need this after age 60. Measles, mumps, and rubella (MMR) vaccine  You may need at least one dose of MMR if you were born in 1957 or later. You may also need a second dose. Pneumococcal conjugate (PCV13) vaccine  You may need this if you have certain conditions and were not previously vaccinated. Pneumococcal polysaccharide (PPSV23) vaccine  You may need one or two doses if you smoke cigarettes or if you have  certain conditions. Meningococcal conjugate (MenACWY) vaccine  You may need this if you have certain conditions. Hepatitis A vaccine  You may need this if you have certain conditions or if you travel or work in places where you may be exposed to hepatitis A. Hepatitis B vaccine  You may need this if you have certain conditions or if you travel or work in places where you may be exposed to hepatitis B. Haemophilus influenzae type b (Hib) vaccine  You may need this if you have certain conditions. Human papillomavirus (HPV) vaccine  If recommended by your health care provider, you may need three doses over 6 months. You may receive vaccines as individual doses or as more than one vaccine together in one shot (combination vaccines). Talk with your health care provider about the risks and benefits of combination vaccines. What tests do I need? Blood tests  Lipid and cholesterol levels. These may be checked every 5 years, or more frequently if you are over 42 years old.  Hepatitis C test.  Hepatitis B test. Screening  Lung cancer screening. You may have this screening every year starting at age 42 if you have a 30-pack-year history of smoking and currently smoke or have quit within the past 15 years.  Colorectal cancer screening. All adults should have this screening starting at age 42 and continuing until age 75. Your health care provider may recommend screening at age 42 if you are at increased risk. You will have tests every 1-10 years, depending on your results and the type of screening test.  Diabetes screening. This is done by checking your blood sugar (  your blood sugar (glucose) after you have not eaten for a while (fasting). You may have this done every 1-3 years.  Mammogram. This may be done every 1-2 years. Talk with your health care provider about when you should start having regular mammograms. This may depend on whether you have a family history of breast cancer.  BRCA-related cancer  screening. This may be done if you have a family history of breast, ovarian, tubal, or peritoneal cancers.  Pelvic exam and Pap test. This may be done every 3 years starting at age 42. Starting at age 35, this may be done every 5 years if you have a Pap test in combination with an HPV test. Other tests  Sexually transmitted disease (STD) testing.  Bone density scan. This is done to screen for osteoporosis. You may have this scan if you are at high risk for osteoporosis. Follow these instructions at home: Eating and drinking  Eat a diet that includes fresh fruits and vegetables, whole grains, lean protein, and low-fat dairy.  Take vitamin and mineral supplements as recommended by your health care provider.  Do not drink alcohol if: ? Your health care provider tells you not to drink. ? You are pregnant, may be pregnant, or are planning to become pregnant.  If you drink alcohol: ? Limit how much you have to 0-1 drink a day. ? Be aware of how much alcohol is in your drink. In the [place] one drink equals one 12 oz bottle of beer (355 mL), one 5 oz glass of wine (148 mL), or one 1 oz glass of hard liquor (44 mL). Lifestyle  Take daily care of your teeth and gums.  Stay active. Exercise for at least 30 minutes on 5 or more days each week.  Do not use any products that contain nicotine or tobacco, such as cigarettes, e-cigarettes, and chewing tobacco. If you need help quitting, ask your health care provider.  If you are sexually active, practice safe sex. Use a condom or other form of birth control (contraception) in order to prevent pregnancy and STIs (sexually transmitted infections).  If told by your health care provider, take low-dose aspirin daily starting at age 42. What's next?  Visit your health care provider once a year for a well check visit.  Ask your health care provider how often you should have your eyes and teeth checked.  Stay up to date on all vaccines. This  information is not intended to replace advice given to you by your health care provider. Make sure you discuss any questions you have with your health care provider. Document Revised: 03/02/2018 Document Reviewed: 03/02/2018 Elsevier Patient Education  2020 [employer].

## 2019-12-31 NOTE — Progress Notes (Signed)
Subjective:    Patient ID: Maria Hernandez, female    DOB: [birth year], 44 y.o.   MRN: [id number]  HPI  Patient presents today for complete physical.  Immunizations:  Tdap 2019 Diet: healthy Wt Readings from Last 3 Encounters:  12/31/19 136 lb (61.7 kg)  08/30/19 136 lb 12.8 oz (62.1 kg)  07/16/19 138 lb (62.6 kg)  exercise:  walks Pap Smear: 05/04/18 Mammogram: 08/27/19 Vision: up to date Dental: up to date    Review of Systems  Constitutional: Negative for unexpected weight change.  HENT: Positive for hearing loss (some loss in left ear following surgeries). Negative for rhinorrhea.   Eyes: Negative for visual disturbance.  Respiratory: Negative for cough and shortness of breath.   Cardiovascular: Negative for chest pain.  Gastrointestinal: Negative for blood in stool, constipation and diarrhea.  Genitourinary: Negative for dysuria, frequency and hematuria.  Musculoskeletal: Positive for arthralgias (chronic bilateral knee pain). Negative for myalgias.  Skin: Negative for rash.  Neurological: Positive for headaches (some headaches related to need to update vision rx).  Hematological: Negative for adenopathy.  Psychiatric/Behavioral:       Denies depression/anxiety   Past Medical History:  Diagnosis Date  . Migraines      Social History   Socioeconomic History  . Marital status: Single    Spouse name: Not on file  . Number of children: Not on file  . Years of education: Not on file  . Highest education level: Not on file  Occupational History  . Not on file  Tobacco Use  . Smoking status: Never Smoker  . Smokeless tobacco: Never Used  Vaping Use  . Vaping Use: Never used  Substance and Sexual Activity  . Alcohol use: Yes    Comment: occasional wine "1- 2 per month"  . Drug use: Never  . Sexual activity: Not Currently    Birth control/protection: None  Other Topics Concern  . 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 Difficulty of Paying Living Expenses:   Food Insecurity:   . Worried About Programme researcher, broadcasting/film/video in the Last Year:   . Barista in the Last Year:   Transportation Needs:   . Freight forwarder (Medical):   Marland Kitchen Lack of Transportation (Non-Medical):   Physical Activity:   . Days of Exercise per Week:   . Minutes of Exercise per Session:   Stress:   . Feeling of Stress :   Social Connections:   . Frequency of Communication with Friends and Family:   . Frequency of Social Gatherings with Friends and Family:   . Attends Religious Services:   . Active Member of Clubs or Organizations:   . Attends Banker Meetings:   Marland Kitchen Marital Status:   Intimate Partner Violence:   . Fear of Current or Ex-Partner:   . Emotionally Abused:   Marland Kitchen Physically Abused:   . Sexually Abused:     Past Surgical History:  Procedure Laterality Date  . AUGMENTATION MAMMAPLASTY    . BREAST ENHANCEMENT SURGERY Bilateral 2005  . INNER EAR SURGERY Left    Reports 2 prior ear  surgeries for prosthesis. 3rd surgery coming up in January 2019  . MANDIBLE OSTEOTOMY  1995    Family History  Problem Relation Age of Onset  . Hypothyroidism Mother   . Hypertension Father   . Diabetes type II Father        BKA  . Breast cancer Maternal Grandmother     Allergies  Allergen Reactions  . Verapamil Hives  . Macrobid [Nitrofurantoin Macrocrystal]     Light headed    Current Outpatient Medications on File Prior to Visit  Medication Sig Dispense Refill  . diclofenac (VOLTAREN) 75 MG EC tablet Take 1 tablet by mouth twice daily 60 tablet 0  . ibuprofen (ADVIL,MOTRIN) 200 MG tablet Take 200 mg by mouth as needed.    . meloxicam (MOBIC) 15 MG tablet Take 1 tablet (15 mg total) by mouth daily as  needed. 30 tablet 1  . Multiple Vitamin (MULTIVITAMIN) tablet Take 1 tablet by mouth daily.    . SUMAtriptan (IMITREX) 50 MG tablet TAKE 1 TABLET BY MOUTH BY MOUTH AS NEEDED FOR MIGRAINE HEADACHE. MAY REPEAT IN 2 HOURS AND TAKE 1 TABLET IF NEEDED. 10 tablet 5   No current facility-administered medications on file prior to visit.    BP 113/77 (BP Location: Right Arm, Patient Position: Sitting, Cuff Size: Small)   Pulse 63   Temp 97.9 F (36.6 C) (Temporal)   Resp 16   Ht 5' 7.5" (1.715 m)   Wt 136 lb (61.7 kg)   SpO2 100%   BMI 20.99 kg/m       Objective:   Physical Exam  Physical Exam  Constitutional: She is oriented to person, place, and time. She appears well-developed and well-nourished. No distress.  HENT:  Head: Normocephalic and atraumatic.  Right Ear: Tympanic membrane and ear canal normal.  Left Ear: Tympanic membrane is scarred and ear canal is normal.  Mouth/Throat: Oropharynx is clear and moist.  Eyes: Pupils are equal, round, and reactive to light. No scleral icterus.  Neck: Normal range of motion. No thyromegaly present.  Cardiovascular: Normal rate and regular rhythm.   No murmur heard. Pulmonary/Chest: Effort normal and breath sounds normal. No respiratory distress. He has no wheezes. She has no rales. She exhibits no tenderness.  Abdominal: Soft. Bowel sounds are normal. She exhibits no distension and no mass. There is no tenderness. There is no rebound and no guarding.  Musculoskeletal: She exhibits no edema.  Lymphadenopathy:    She has no cervical adenopathy.  Neurological: She is alert and oriented to person, place, and time. She has normal patellar reflexes. She exhibits normal muscle tone. Coordination normal.  Skin: Skin is warm and dry.  Psychiatric: She has a normal mood and affect. Her behavior is normal. Judgment and thought content normal.  Breast/pelvic: deferred        Assessment & Plan:   Preventative care- encouraged pt to continue  healthy diet and exercise.  Obtain routine lab work.  mammo and pap up to date. Pt would like to add HIV onto labs due to a needlestick injury a few months back at work.   This visit occurred during the SARS-CoV-2 public health emergency.  Safety protocols were in place, including screening questions prior to the visit, additional usage of staff PPE, and extensive cleaning of exam room while observing appropriate contact time as indicated for disinfecting solutions.         Assessment & Plan:

## 2020-01-01 LAB — HIV ANTIBODY (ROUTINE TESTING W REFLEX): HIV 1&2 Ab, 4th Generation: NONREACTIVE

## 2020-01-02 ENCOUNTER — Encounter: Payer: Self-pay | Admitting: Family

## 2020-01-29 ENCOUNTER — Encounter: Payer: Self-pay | Admitting: Physical Therapy

## 2020-01-30 ENCOUNTER — Other Ambulatory Visit: Payer: Self-pay

## 2020-01-30 ENCOUNTER — Ambulatory Visit: Payer: No Typology Code available for payment source | Admitting: Family Medicine

## 2020-01-30 ENCOUNTER — Encounter: Payer: Self-pay | Admitting: Family

## 2020-01-30 ENCOUNTER — Encounter: Payer: Self-pay | Admitting: Family Medicine

## 2020-01-30 VITALS — BP 102/78 | Ht 67.5 in | Wt 134.0 lb

## 2020-01-30 DIAGNOSIS — M25561 Pain in right knee: Secondary | ICD-10-CM | POA: Diagnosis not present

## 2020-01-30 DIAGNOSIS — M25562 Pain in left knee: Secondary | ICD-10-CM | POA: Diagnosis not present

## 2020-01-30 DIAGNOSIS — G8929 Other chronic pain: Secondary | ICD-10-CM | POA: Diagnosis not present

## 2020-01-30 NOTE — Progress Notes (Signed)
    SUBJECTIVE:   CHIEF COMPLAINT / HPI:   Bilateral Knee Pain Maria Hernandez is a pleasant 44 year old female who is an established patient at this practice presenting today for continued bilateral knee pain.  Today she states her pain is no better and she continues to have acute on chronic flares where she has pain at the medial side of her knee located just inside the patella and her patella is tender if pushed in.  She states she can tolerate going up steps but this does tend to aggravate it and going up a steep incline will certainly cause a lot of discomfort and pain.  She states she has gone to physical therapy for at least 2 months along with extensive home exercise program and tried over-the-counter medications with little to no relief.  Issues date back to January 2020.  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   PERTINENT  PMH / PSH: None  OBJECTIVE:   BP 102/78   Ht 5' 7.5" (1.715 m)   Wt 134 lb (60.8 kg)   BMI 20.68 kg/m   MSK: Knee, Right: Inspection was negative for erythema, ecchymosis, and effusion.  She does have valgus alignment of the knees bilaterally.  No obvious bony abnormalities or signs of osteophyte development. Palpation yielded no asymmetric warmth; No joint line tenderness; No condyle tenderness; Positive for patellar tenderness; Mild patellar crepitus. Patellar unremarkable but quadriceps tendon and muscle is small, no tenderness of the pes anserine bursa. No obvious Baker's cyst development. ROM normal in flexion (135 degrees) and extension (0 degrees). Normal hamstring and quadriceps strength. Neurovascularly intact bilaterally. Special Tests  - Ligaments:   - Anterior Drawer:  NEG - Posterior Drawer: NEG   - Varus/Valgus Stress test: NEG  - Meniscus:   - McMurray's: NEG  - Patella:   - Patellar grind/compression: Equivocal Knee, Left: Inspection was negative for erythema, ecchymosis, and effusion.   She does have slight valgus alignment of the knees bilaterally.  No obvious bony abnormalities or signs of osteophyte development. Palpation yielded no asymmetric warmth; No joint line tenderness; No condyle tenderness; Positive for patellar tenderness; Mild patellar crepitus. Patellar unremarkable but quadriceps tendon and muscle is small, no tenderness of the pes anserine bursa. No obvious Baker's cyst development. ROM normal in flexion (135 degrees) and extension (0 degrees). Normal hamstring and quadriceps strength. Neurovascularly intact bilaterally. Special Tests  - Ligaments:   - Anterior Drawer:  NEG - Posterior Drawer: NEG   - Varus/Valgus Stress test: NEG  - Meniscus:   - McMurray's: NEG  - Patella:   - Patellar grind/compression: Equivocal  ASSESSMENT/PLAN:   Knee pain, bilateral Patient has had acute on chronic flareup of her bilateral knee pain mainly located in the patellar region thought to be due to patellofemoral syndrome.  Given that she has tried multiple medications she has already had 1 injection of corticosteroid with little relief and PT, extensive home exericses and this is still recurring she would like a referral to orthopedics to at least discuss the possibility of surgery. -Referral to Ortho -Continue physical therapy and exercises at home, she does have decreased quad bulk which is most likely contributing to her condition.  Arlyce Harman, DO [facility]

## 2020-01-30 NOTE — Patient Instructions (Signed)
It was great to see you today! Thank you for letting me participate in your care!  Today, we discussed your continued bilateral knee pain from patellar-femoral syndrome caused by poor tracking of the patella and weak quadraceps muscles. Because you have failed multiple conservative therapies we will refer you to orthopedics to discuss your surgical options.  Be well, Jules Schick, DO PGY-4, Sports Medicine Fellow [facility]

## 2020-01-30 NOTE — Assessment & Plan Note (Signed)
Patient has had acute on chronic flareup of her bilateral knee pain mainly located in the patellar region thought to be due to patellofemoral syndrome.  Given that she has tried multiple medications she is already had 1 injection of corticosteroid with little to moderate relief and PT and this is still recurring she would like a referral to orthopedics to at least discuss the possibility of surgery. -Referral to Ortho -Continue physical therapy and exercises at home, she does have small quads which is most likely contributing to her condition.

## 2020-02-08 ENCOUNTER — Other Ambulatory Visit: Payer: Self-pay | Admitting: Family Medicine

## 2020-02-11 ENCOUNTER — Ambulatory Visit: Payer: No Typology Code available for payment source | Admitting: Orthopedic Surgery

## 2020-02-11 ENCOUNTER — Other Ambulatory Visit: Payer: Self-pay

## 2020-02-11 VITALS — Ht 68.0 in | Wt 137.0 lb

## 2020-02-11 DIAGNOSIS — M25561 Pain in right knee: Secondary | ICD-10-CM

## 2020-02-11 DIAGNOSIS — M25562 Pain in left knee: Secondary | ICD-10-CM | POA: Diagnosis not present

## 2020-02-12 ENCOUNTER — Encounter: Payer: Self-pay | Admitting: Family

## 2020-02-12 ENCOUNTER — Encounter: Payer: Self-pay | Admitting: Orthopedic Surgery

## 2020-02-12 MED ORDER — DICLOFENAC SODIUM 75 MG PO TBEC: 75.0000 mg | DELAYED_RELEASE_TABLET | Freq: Two times a day (BID) | ORAL | 1 refills | Status: DC

## 2020-02-12 MED ORDER — FLUCONAZOLE 150 MG PO TABS: 150.0000 mg | ORAL_TABLET | Freq: Once | ORAL | 0 refills | Status: AC

## 2020-02-12 NOTE — Telephone Encounter (Signed)
Do you want me to schedule her an appointment with someone in office or will you just call in the medication?

## 2020-02-12 NOTE — Progress Notes (Signed)
Office Visit Note   Patient: Maria Hernandez           Date of Birth: 10-05-1975           MRN: [id number] Visit Date: 02/11/2020 Requested by: Sandford Craze, NP [address] [place],  [place] [address] PCP: Sandford Craze, NP  Subjective: Chief Complaint  Patient presents with  . 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  She has a history of runner's knee.  She has had pain for 16 months.  She does not run anymore.  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  MRI on the system from 05/25/2019 demonstrates intact medial lateral compartment.  Possibly mildly inflamed thickened plica but otherwise no definite pathologic process present.  She states that her knee flares up and will cause her to be out of work.  She does wake up with pain after increasing her activity.  She describes some mechanical symptoms in both knees.  She has currently been out of weeks due to the pain flareup.  Takes diclofenac and Tylenol.  Cortisone injection in the past has not been helpful.              ROS: All systems reviewed are negative as they relate to the chief complaint within the history of present illness.  Patient denies  fevers or chills.   Assessment & Plan: Visit Diagnoses:  1. Bilateral anterior knee pain     Plan: Impression is bilateral anterior knee pain which is primarily medial.  This does not look like iliotibial band bursitis.  No effusion and no real malalignment or patellar maltracking.  Hard to say really exactly what the pain generator is in both knees.  I do not see anything on MRI scan that would really explain the severity of symptoms she has had to the point where she has had to be out of work for weeks at a time.  Nothing definitively actionable in terms of arthroscopic treatment.  I think a repeat injection into the left knee is indicated today.  S really for diagnostic and therapeutic purposes.   Perhaps she has medial plica which is inflamed but that would be unusual to get this much disability from an inflamed plica.  If this helps her knee pain on the left we could consider injecting the right knee as well.  Bracing not really indicated in this patient.  Follow-Up Instructions: Return if symptoms worsen or fail to improve.   Orders:  No orders of the defined types were placed in this encounter.  No orders of the defined types were placed in this encounter.     Procedures: No procedures performed   Clinical Data: No additional findings.  Objective: Vital Signs: Ht 5\' 8"  (1.727 m)   Wt 137 lb (62.1 kg)   BMI 20.83 kg/m   Physical Exam:   Constitutional: Patient appears well-developed HEENT:  Head: Normocephalic Eyes:EOM are normal Neck: Normal range of motion Cardiovascular: Normal rate Pulmonary/chest: Effort normal Neurologic: Patient is alert Skin: Skin is warm Psychiatric: Patient has normal mood and affect    Ortho Exam: Ortho exam demonstrates normal gait alignment.  No increased Q angle.  Not much in the way of patellofemoral crepitus bilaterally.  No groin pain with internal X rotation of the leg no nerve root tension signs.  Pedal pulses palpable.  Collateral and cruciate ligaments are stable.  She does not really have much in the way of joint line tenderness medially or laterally.  Does have a little bit of pain on the medial aspect of the patella bilaterally left worse than right.  No skin changes or color or temperature differences left knee versus right knee.  Specialty Comments:  No specialty comments available.  Imaging: No results found.   PMFS History: Patient Active Problem List   Diagnosis Date Noted  . Knee pain, bilateral 07/16/2019  . Chronic right-sided low back pain without sciatica 05/09/2019  . Migraines 06/07/2017  . Hearing loss 06/07/2017   Past Medical History:  Diagnosis Date  . Migraines     Family History  Problem  Relation Age of Onset  . Hypothyroidism Mother   . Hypertension Father   . Diabetes type II Father        BKA  . Breast cancer Maternal Grandmother     Past Surgical History:  Procedure Laterality Date  . AUGMENTATION MAMMAPLASTY    . BREAST ENHANCEMENT SURGERY Bilateral 2005  . INNER EAR SURGERY Left    Reports 2 prior ear surgeries for prosthesis. 3rd surgery coming up in January 2019  . MANDIBLE OSTEOTOMY  1995   Social History   Occupational History  . Not on file  Tobacco Use  . Smoking status: Never Smoker  . Smokeless tobacco: Never Used  Vaping Use  . Vaping Use: Never used  Substance and Sexual Activity  . Alcohol use: Yes    Comment: occasional wine "1- 2 per month"  . Drug use: Never  . Sexual activity: Not Currently    Birth control/protection: None

## 2020-02-13 ENCOUNTER — Telehealth: Payer: Self-pay

## 2020-02-13 ENCOUNTER — Ambulatory Visit: Payer: No Typology Code available for payment source | Admitting: Orthopedic Surgery

## 2020-02-13 NOTE — Telephone Encounter (Signed)
-----   Message from Prescott Parma, RT sent at 02/13/2020  8:00 AM EDT ----- Can you please help with this? ----- Message ----- From: Cammy Copa, MD Sent: 02/12/2020   8:06 PM EDT To: Prescott Parma, RT  Hi Lauren can you preapproved for for gel injections.  She does not really have arthritis so it may not work.

## 2020-02-14 ENCOUNTER — Ambulatory Visit: Payer: No Typology Code available for payment source | Admitting: Orthopedic Surgery

## 2020-02-15 ENCOUNTER — Encounter: Payer: Self-pay | Admitting: Family

## 2020-02-15 NOTE — Telephone Encounter (Signed)
Called and started Prior auth with focus plan. OV note was faxed for review with plan. Once reviewed they will fax back letter if pt is approved or not

## 2020-02-16 ENCOUNTER — Encounter: Payer: Self-pay | Admitting: Family

## 2020-02-18 ENCOUNTER — Ambulatory Visit (INDEPENDENT_AMBULATORY_CARE_PROVIDER_SITE_OTHER): Payer: No Typology Code available for payment source | Admitting: Family

## 2020-02-18 ENCOUNTER — Other Ambulatory Visit (HOSPITAL_COMMUNITY)
Admission: RE | Admit: 2020-02-18 | Discharge: 2020-02-18 | Disposition: A | Discharge status: Home / Self Care | Payer: No Typology Code available for payment source | Source: Ambulatory Visit | Attending: Family | Admitting: Family

## 2020-02-18 ENCOUNTER — Encounter: Payer: Self-pay | Admitting: Family

## 2020-02-18 ENCOUNTER — Other Ambulatory Visit: Payer: Self-pay

## 2020-02-18 ENCOUNTER — Telehealth: Payer: Self-pay | Admitting: Orthopedic Surgery

## 2020-02-18 VITALS — BP 127/83 | HR 74 | Temp 98.8°F | Resp 16 | Ht 67.5 in | Wt 137.0 lb

## 2020-02-18 DIAGNOSIS — R300 Dysuria: Secondary | ICD-10-CM | POA: Diagnosis not present

## 2020-02-18 DIAGNOSIS — N760 Acute vaginitis: Secondary | ICD-10-CM | POA: Insufficient documentation

## 2020-02-18 LAB — POC URINALSYSI DIPSTICK (AUTOMATED)
Bilirubin, UA: NEGATIVE
Glucose, UA: NEGATIVE
Ketones, UA: NEGATIVE
Nitrite, UA: NEGATIVE
Protein, UA: NEGATIVE
Spec Grav, UA: 1.025 (ref 1.010–1.025)
Urobilinogen, UA: 0.2 E.U./dL
pH, UA: 6 (ref 5.0–8.0)

## 2020-02-18 NOTE — Telephone Encounter (Signed)
Can we see about getting patient approved for gel injection?

## 2020-02-18 NOTE — Telephone Encounter (Signed)
Was seen today .

## 2020-02-18 NOTE — Patient Instructions (Signed)
We will contact you with your results and further instructions.  

## 2020-02-18 NOTE — Progress Notes (Signed)
Subjective:    Patient ID: Maria Hernandez, female    DOB: 12/19/1975, 44 y.o.   MRN: [id number]  HPI   Patient is a 44 yr old female who presents today with chief complaint of vaginal irritation.  We treated her on 8/10 with diflucan. She reports that she was having a milky discharge at that time. States that she is  No longer having discharge.    Reports that she has some sensitivity from shaving.   Has a soreness when she urinates.  Reports left labia is "burning."  Denies lesions.  Notes a bit of a burning sensation with urination. Denies any new sexual partners.   Review of Systems See HPI  Past Medical History:  Diagnosis Date  . Migraines      Social History   Socioeconomic History  . Marital status: Single    Spouse name: Not on file  . Number of children: Not on file  . Years of education: Not on file  . Highest education level: Not on file  Occupational History  . Not on file  Tobacco Use  . Smoking status: Never Smoker  . Smokeless tobacco: Never Used  Vaping Use  . Vaping Use: Never used  Substance and Sexual Activity  . Alcohol use: Yes    Comment: occasional wine "1- 2 per month"  . Drug use: Never  . Sexual activity: Not Currently    Birth control/protection: None  Other Topics Concern  . 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 Difficulty of Paying Living Expenses:   Food Insecurity:   . Worried About Programme researcher, broadcasting/film/video in the Last Year:   . Barista in the Last Year:   Transportation Needs:   . Freight forwarder (Medical):   Marland Kitchen Lack of Transportation (Non-Medical):   Physical Activity:   . Days of Exercise per Week:   . Minutes of Exercise per Session:   Stress:   . Feeling of Stress :    Social Connections:   . Frequency of Communication with Friends and Family:   . Frequency of Social Gatherings with Friends and Family:   . Attends Religious Services:   . Active Member of Clubs or Organizations:   . Attends Banker Meetings:   Marland Kitchen Marital Status:   Intimate Partner Violence:   . Fear of Current or Ex-Partner:   . Emotionally Abused:   Marland Kitchen Physically Abused:   . Sexually Abused:     Past Surgical History:  Procedure Laterality Date  . AUGMENTATION MAMMAPLASTY    . BREAST ENHANCEMENT SURGERY Bilateral 2005  . INNER EAR SURGERY Left    Reports 2 prior ear surgeries for prosthesis. 3rd surgery coming up in January 2019  . MANDIBLE OSTEOTOMY  1995    Family History  Problem Relation Age of Onset  . Hypothyroidism Mother   . Hypertension Father   . Diabetes type II Father        BKA  . Breast cancer Maternal Grandmother     Allergies  Allergen Reactions  . Verapamil Hives  . Macrobid [Nitrofurantoin Macrocrystal]  Light headed  . Metronidazole Other (See Comments)    Current Outpatient Medications on File Prior to Visit  Medication Sig Dispense Refill  . diclofenac (VOLTAREN) 75 MG EC tablet Take 1 tablet (75 mg total) by mouth 2 (two) times daily. 60 tablet 1  . Multiple Vitamin (MULTIVITAMIN) tablet Take 1 tablet by mouth daily.    . SUMAtriptan (IMITREX) 50 MG tablet TAKE 1 TABLET BY MOUTH BY MOUTH AS NEEDED FOR MIGRAINE HEADACHE. MAY REPEAT IN 2 HOURS AND TAKE 1 TABLET IF NEEDED. 10 tablet 5   No current facility-administered medications on file prior to visit.    BP 127/83 (BP Location: Right Arm, Patient Position: Sitting, Cuff Size: Small)   Pulse 74   Temp 98.8 F (37.1 C) (Oral)   Resp 16   Ht 5' 7.5" (1.715 m)   Wt 137 lb (62.1 kg)   SpO2 100%   BMI 21.14 kg/m       Objective:   Physical Exam Exam conducted with a chaperone present.  Constitutional:      Appearance: She is well-developed.  Cardiovascular:      Rate and Rhythm: Normal rate and regular rhythm.     Heart sounds: Normal heart sounds. No murmur heard.   Pulmonary:     Effort: Pulmonary effort is normal. No respiratory distress.     Breath sounds: Normal breath sounds. No wheezing.  Genitourinary:    General: Normal vulva.     Vagina: No vaginal discharge.  Psychiatric:        Behavior: Behavior normal.        Thought Content: Thought content normal.        Judgment: Judgment normal.           Assessment & Plan:  Vaginitis- normal exam- swab obtained and will be sent for BV and Yeast testing.  Will send urine for culture. Plan to treat if positive for UTI.    This visit occurred during the SARS-CoV-2 public health emergency.  Safety protocols were in place, including screening questions prior to the visit, additional usage of staff PPE, and extensive cleaning of exam room while observing appropriate contact time as indicated for disinfecting solutions.

## 2020-02-18 NOTE — Telephone Encounter (Signed)
Patient called. She is waiting on approval for a gel injection. Would like to know the status.

## 2020-02-19 ENCOUNTER — Encounter: Payer: Self-pay | Admitting: Family

## 2020-02-19 LAB — CERVICOVAGINAL ANCILLARY ONLY
Bacterial Vaginitis (gardnerella): NEGATIVE
Candida Glabrata: NEGATIVE
Candida Vaginitis: NEGATIVE
Comment: NEGATIVE
Comment: NEGATIVE
Comment: NEGATIVE

## 2020-02-19 NOTE — Telephone Encounter (Signed)
Still waiting on focus plan

## 2020-02-19 NOTE — Telephone Encounter (Signed)
Pt informed I submitted for review on Friday and that I am waiting for an decision. She stated understanding

## 2020-02-19 NOTE — Telephone Encounter (Signed)
I think you may have started this already.  See message below.

## 2020-02-20 ENCOUNTER — Encounter: Payer: Self-pay | Admitting: Family

## 2020-02-20 ENCOUNTER — Encounter: Payer: No Typology Code available for payment source | Admitting: Family

## 2020-02-20 LAB — URINE CULTURE
MICRO NUMBER:: 10830747
SPECIMEN QUALITY:: ADEQUATE

## 2020-02-21 ENCOUNTER — Other Ambulatory Visit: Payer: Self-pay

## 2020-02-21 ENCOUNTER — Telehealth: Payer: Self-pay | Admitting: Family

## 2020-02-21 MED ORDER — AMOXICILLIN-POT CLAVULANATE 875-125 MG PO TABS: 1.0000 | ORAL_TABLET | Freq: Two times a day (BID) | ORAL | 0 refills | Status: AC

## 2020-02-21 MED ORDER — FLUCONAZOLE 150 MG PO TABS
ORAL_TABLET | ORAL | 0 refills | Status: DC
Start: 2020-02-21 — End: 2020-04-14

## 2020-02-21 NOTE — Telephone Encounter (Signed)
Left detail message for patient to be aware of a new rx and for her to call back abut resutls. rx sent.

## 2020-02-21 NOTE — Telephone Encounter (Signed)
Medication: diclofenac (VOLTAREN) 75 MG EC tablet [[id number]]   Has the patient contacted their pharmacy? No. (If no, request that the patient contact the pharmacy for the refill.) (If yes, when and what did the pharmacy advise?)  Preferred Pharmacy (with phone number or street name): [facility] [address] [place], [place] - [address]  [address], [place] [address]  Phone:  [phone] Fax:  [phone]  DEA #:  --  Agent: Please be advised that RX refills may take up to 3 business days. We ask that you follow-up with your pharmacy.

## 2020-02-21 NOTE — Telephone Encounter (Signed)
This message is wrong, patient already picked up this medication already

## 2020-02-21 NOTE — Telephone Encounter (Signed)
Please advise pt that urine culture is + for UTI.  I would like her to start augmentin 875mg  bid x 5 days.  Can you please send to pharmacy for me- I am having computer issues.  Tks

## 2020-02-21 NOTE — Addendum Note (Signed)
Addended by: Sandford Craze on: 02/21/2020 03:53 PM   Modules accepted: Orders

## 2020-02-21 NOTE — Progress Notes (Signed)
au

## 2020-02-22 NOTE — Telephone Encounter (Signed)
Per focus plan pt is denied for Gel injection due to dx of knee pain and no knee OA.

## 2020-02-22 NOTE — Telephone Encounter (Signed)
Can you please advise on how to proceed? Patients insurance denied gel injection

## 2020-02-22 NOTE — Telephone Encounter (Signed)
Really depends on if the injection helped.  If the injection did not help at all then I do not think there is any chance arthroscopy would help.  Realistically with her scan done last year and the pathology present it is unlikely that arthroscopy would be beneficial.  As a last resort we could consider MRI arthrogram just for more detailed look at the knee.  Short of that I would give diagnostic arthroscopy about a 1 in 10 chance of actually helping her.  Best bet would be MRI arthrogram since her other scan was about 8 months ago.

## 2020-02-25 ENCOUNTER — Encounter: Payer: No Typology Code available for payment source | Admitting: Family

## 2020-02-25 NOTE — Telephone Encounter (Signed)
IC and explained per below. She states she did get some relief from the injection and is interested in trying it with the opposite knee. Scheduled for her to see Franky Macho this week for that.

## 2020-02-27 ENCOUNTER — Encounter: Payer: Self-pay | Admitting: Family

## 2020-02-27 ENCOUNTER — Ambulatory Visit: Payer: No Typology Code available for payment source | Admitting: Surgical

## 2020-02-27 ENCOUNTER — Encounter: Payer: Self-pay | Admitting: Surgical

## 2020-02-27 DIAGNOSIS — M25561 Pain in right knee: Secondary | ICD-10-CM

## 2020-02-27 DIAGNOSIS — R102 Pelvic and perineal pain: Secondary | ICD-10-CM

## 2020-02-27 MED ORDER — METHYLPREDNISOLONE ACETATE 40 MG/ML IJ SUSP
40.0000 mg | INTRAMUSCULAR | Status: AC | PRN
  Administered 2020-02-27: 40 mg via INTRA_ARTICULAR

## 2020-02-27 MED ORDER — LIDOCAINE HCL 1 % IJ SOLN
5.0000 mL | INTRAMUSCULAR | Status: AC | PRN
  Administered 2020-02-27: 5 mL

## 2020-02-27 MED ORDER — BUPIVACAINE HCL 0.25 % IJ SOLN
4.0000 mL | INTRAMUSCULAR | Status: AC | PRN
  Administered 2020-02-27: 4 mL via INTRA_ARTICULAR

## 2020-02-27 NOTE — Progress Notes (Signed)
   Procedure Note  Patient: Maria Hernandez             Date of Birth: June 18, 1976           MRN: [id number]             Visit Date: 02/27/2020  Procedures: Visit Diagnoses:  1. Tenderness of right knee     Large Joint Inj: R knee on 02/27/2020 1:56 PM Indications: diagnostic evaluation, joint swelling and pain Details: 18 G 1.5 in needle, superolateral approach  Arthrogram: No  Medications: 5 mL lidocaine 1 %; 40 mg methylPREDNISolone acetate 40 MG/ML; 4 mL bupivacaine 0.25 % Outcome: tolerated well, no immediate complications  Patient comes in for planned right knee cortisone injection.  Patient tolerated the procedure well.  No changes since speaking with Dr. August Saucer several weeks ago.  On exam has no effusion or significant ligamentous laxity.  Of note, she has most tenderness over the medial femoral condyle, specifically with the knee hyperflexed more so than when the knee is simply flexed to 90 degrees.  Discussed that if patient's pain is not significantly improved with these injections and she returns in 3 months with similar or worsened pain, we may proceed with MRI arthrogram of the right knee.  Patient agreed with plan.  Follow-up in 3 months. Procedure, treatment alternatives, risks and benefits explained, specific risks discussed. Consent was given by the patient. Immediately prior to procedure a time out was called to verify the correct patient, procedure, equipment, support staff and site/side marked as required. Patient was prepped and draped in the usual sterile fashion.

## 2020-02-28 ENCOUNTER — Encounter: Payer: Self-pay | Admitting: Family

## 2020-02-28 ENCOUNTER — Other Ambulatory Visit (INDEPENDENT_AMBULATORY_CARE_PROVIDER_SITE_OTHER): Payer: No Typology Code available for payment source

## 2020-02-28 ENCOUNTER — Other Ambulatory Visit: Payer: Self-pay

## 2020-02-28 ENCOUNTER — Telehealth: Payer: Self-pay

## 2020-02-28 DIAGNOSIS — R102 Pelvic and perineal pain: Secondary | ICD-10-CM | POA: Diagnosis not present

## 2020-02-28 NOTE — Telephone Encounter (Signed)
I tried to get [place] for [employer]

## 2020-02-28 NOTE — Telephone Encounter (Signed)
Talked with patient concerning gel injection, see previous message in chart.

## 2020-02-28 NOTE — Telephone Encounter (Signed)
Patient would like to know the name of the gel injection that she was denied.  Stated that she has to give the name to her insurance.  Cb# [phone].  Please advise.  Thank you.

## 2020-02-28 NOTE — Telephone Encounter (Signed)
Talked with patient concerning denial for gel injection and advised her the price of the gel injection, if she decides to pay out of pocket.

## 2020-02-29 LAB — HSV 2 ANTIBODY, IGG: HSV 2 Glycoprotein G Ab, IgG: <0.9 index

## 2020-03-11 DIAGNOSIS — M25562 Pain in left knee: Secondary | ICD-10-CM

## 2020-03-14 NOTE — Telephone Encounter (Signed)
Called and SW pt. Can you order MRI arthrogram left knee?

## 2020-04-14 ENCOUNTER — Encounter: Payer: Self-pay | Admitting: Family

## 2020-04-14 ENCOUNTER — Ambulatory Visit
Admission: RE | Admit: 2020-04-14 | Discharge: 2020-04-14 | Disposition: A | Discharge status: Home / Self Care | Payer: No Typology Code available for payment source | Source: Ambulatory Visit | Attending: Surgical | Admitting: Surgical

## 2020-04-14 ENCOUNTER — Other Ambulatory Visit: Payer: Self-pay

## 2020-04-14 DIAGNOSIS — M25562 Pain in left knee: Secondary | ICD-10-CM

## 2020-04-14 MED ORDER — FLUCONAZOLE 150 MG PO TABS: ORAL_TABLET | ORAL | 0 refills | Status: DC

## 2020-04-14 MED ORDER — IOPAMIDOL (ISOVUE-M 200) INJECTION 41%
30.0000 mL | Freq: Once | INTRAMUSCULAR | Status: AC
  Administered 2020-04-14: 30 mL via INTRA_ARTICULAR

## 2020-04-17 ENCOUNTER — Other Ambulatory Visit: Payer: Self-pay

## 2020-04-17 ENCOUNTER — Ambulatory Visit (INDEPENDENT_AMBULATORY_CARE_PROVIDER_SITE_OTHER): Payer: No Typology Code available for payment source | Admitting: Internal Medicine

## 2020-04-17 ENCOUNTER — Encounter: Payer: Self-pay | Admitting: Internal Medicine

## 2020-04-17 VITALS — BP 113/77 | HR 70 | Temp 98.6°F | Resp 16 | Ht 67.5 in | Wt 141.5 lb

## 2020-04-17 DIAGNOSIS — R399 Unspecified symptoms and signs involving the genitourinary system: Secondary | ICD-10-CM

## 2020-04-17 LAB — POC URINALSYSI DIPSTICK (AUTOMATED)
Bilirubin, UA: NEGATIVE
Blood, UA: NEGATIVE
Glucose, UA: NEGATIVE
Ketones, UA: NEGATIVE
Leukocytes, UA: NEGATIVE
Nitrite, UA: NEGATIVE
Protein, UA: NEGATIVE
Spec Grav, UA: 1.015 (ref 1.010–1.025)
Urobilinogen, UA: 0.2 E.U./dL
pH, UA: 8 (ref 5.0–8.0)

## 2020-04-17 NOTE — Progress Notes (Signed)
Pre visit review using our clinic review tool, if applicable. No additional management support is needed unless otherwise documented below in the visit note. 

## 2020-04-17 NOTE — Progress Notes (Signed)
   Subjective:    Patient ID: Maria Hernandez, female    DOB: 1975-07-17, 44 y.o.   MRN: [id number]  DOS:  04/17/2020 Type of visit - description: Acute Symptoms started 04/13/2020 with dysuria. Also noted a small amount of white vaginal discharge. She called and was prescribed Diflucan x2. Symptoms improved. She still concerned about this representing a UTI.   Review of Systems Denies fever chills No abdominal pain or flank pain no nausea or vomiting No gross hematuria difficulty urinating or urinary frequency. Denies any vaginal rash  Past Medical History:  Diagnosis Date  . Migraines     Past Surgical History:  Procedure Laterality Date  . AUGMENTATION MAMMAPLASTY    . BREAST ENHANCEMENT SURGERY Bilateral 2005  . INNER EAR SURGERY Left    Reports 2 prior ear surgeries for prosthesis. 3rd surgery coming up in January 2019  . MANDIBLE OSTEOTOMY  1995    Allergies as of 04/17/2020      Reactions   Verapamil Hives   Macrobid [nitrofurantoin Macrocrystal]    Light headed   Metronidazole Other (See Comments)      Medication List       Accurate as of April 17, 2020  8:09 AM. If you have any questions, ask your nurse or doctor.        diclofenac 75 MG EC tablet Commonly known as: VOLTAREN Take 1 tablet (75 mg total) by mouth 2 (two) times daily.   fluconazole 150 MG tablet Commonly known as: DIFLUCAN Take 1 tablet by mouth as needed for yeast infection- you may repeat in 3 days if needed   multivitamin tablet Take 1 tablet by mouth daily.   SUMAtriptan 50 MG tablet Commonly known as: IMITREX TAKE 1 TABLET BY MOUTH BY MOUTH AS NEEDED FOR MIGRAINE HEADACHE. MAY REPEAT IN 2 HOURS AND TAKE 1 TABLET IF NEEDED.          Objective:   Physical Exam BP 113/77 (BP Location: Left Arm, Patient Position: Sitting, Cuff Size: Small)   Pulse 70   Temp 98.6 F (37 C) (Oral)   Resp 16   Ht 5' 7.5" (1.715 m)   Wt 141 lb 8 oz (64.2 kg)   LMP 03/27/2020  (Approximate)   SpO2 100%   BMI 21.83 kg/m  General:   Well developed, NAD, BMI noted.  HEENT:  Normocephalic . Face symmetric, atraumatic Abdomen:  Not distended, soft, non-tender. No rebound or rigidity. No CVA tenderness Skin: Not pale. Not jaundice Lower extremities: no pretibial edema bilaterally  Neurologic:  alert & oriented X3.  Speech normal, gait appropriate for age and unassisted Psych--  Cognition and judgment appear intact.  Cooperative with normal attention span and concentration.  Behavior appropriate. No anxious or depressed appearing.     Assessment    44 year old female, PMH includes migraines, not on birth control, presents with:  Dysuria: As described above, had mild vaginal discharge white in color, better with Diflucan, she is still worried UTI. Udip (-) Plan: UA, urine culture, Rx ABX if urine culture positive keeping in mind  she is allergic to Macrobid and she is not on birth control. If symptoms persist she will need pelvic exam.    This visit occurred during the SARS-CoV-2 public health emergency.  Safety protocols were in place, including screening questions prior to the visit, additional usage of staff PPE, and extensive cleaning of exam room while observing appropriate contact time as indicated for disinfecting solutions.

## 2020-04-18 LAB — URINE CULTURE
MICRO NUMBER:: 11072080
Result:: NO GROWTH
SPECIMEN QUALITY:: ADEQUATE

## 2020-04-18 LAB — URINALYSIS, ROUTINE W REFLEX MICROSCOPIC
Bacteria, UA: NONE SEEN /HPF
Bilirubin Urine: NEGATIVE
Glucose, UA: NEGATIVE
Hgb urine dipstick: NEGATIVE
Hyaline Cast: NONE SEEN /LPF
Ketones, ur: NEGATIVE
Leukocytes,Ua: NEGATIVE
Nitrite: NEGATIVE
Protein, ur: NEGATIVE
Specific Gravity, Urine: 1.015 (ref 1.001–1.03)
WBC, UA: NONE SEEN /HPF (ref 0–5)
pH: >=8.5 — AB (ref 5.0–8.0)

## 2020-05-08 ENCOUNTER — Ambulatory Visit: Payer: No Typology Code available for payment source | Admitting: Orthopedic Surgery

## 2020-06-02 ENCOUNTER — Ambulatory Visit: Payer: No Typology Code available for payment source | Admitting: Orthopedic Surgery

## 2020-06-05 ENCOUNTER — Ambulatory Visit: Payer: No Typology Code available for payment source | Admitting: Orthopedic Surgery

## 2020-06-20 ENCOUNTER — Ambulatory Visit: Payer: No Typology Code available for payment source | Admitting: Orthopedic Surgery

## 2020-06-24 ENCOUNTER — Encounter: Payer: Self-pay | Admitting: Family

## 2020-06-24 DIAGNOSIS — G8929 Other chronic pain: Secondary | ICD-10-CM

## 2020-07-08 ENCOUNTER — Encounter: Payer: Self-pay | Admitting: Family

## 2020-07-08 MED ORDER — FLUCONAZOLE 150 MG PO TABS: ORAL_TABLET | ORAL | 0 refills | Status: DC

## 2020-07-08 NOTE — Progress Notes (Deleted)
[facility] [address], [address] [place], [place] [address] [phone]  Date:  07/10/2020   Name:  Maria Hernandez   DOB:  [birth year]   MRN:  [id number]  PCP:  Sandford Craze, NP    Chief Complaint: No chief complaint on file.   History of Present Illness:  Maria Hernandez is a 45 y.o. very pleasant female patient who presents with the following:  Primary patient of Maria Hernandez, here today with concern of vaginal irritation   Patient Active Problem List   Diagnosis Date Noted  . Knee pain, bilateral 07/16/2019  . Chronic right-sided low back pain without sciatica 05/09/2019  . Migraines 06/07/2017  . Hearing loss 06/07/2017    Past Medical History:  Diagnosis Date  . Migraines     Past Surgical History:  Procedure Laterality Date  . AUGMENTATION MAMMAPLASTY    . BREAST ENHANCEMENT SURGERY Bilateral 2005  . INNER EAR SURGERY Left    Reports 2 prior ear surgeries for prosthesis. 3rd surgery coming up in January 2019  . MANDIBLE OSTEOTOMY  1995    Social History   Tobacco Use  . Smoking status: Never Smoker  . Smokeless tobacco: Never Used  Vaping Use  . Vaping Use: Never used  Substance Use Topics  . Alcohol use: Yes    Comment: occasional wine "1- 2 per month"  . Drug use: Never    Family History  Problem Relation Age of Onset  . Hypothyroidism Mother   . Hypertension Father   . Diabetes type II Father        BKA  . Breast cancer Maternal Grandmother     Allergies  Allergen Reactions  . Verapamil Hives  . Macrobid [Nitrofurantoin Macrocrystal]     Light headed  . Metronidazole Other (See Comments)    Medication list has been reviewed and updated.  Current Outpatient Medications on File Prior to Visit  Medication Sig Dispense Refill  . diclofenac (VOLTAREN) 75 MG EC tablet Take 1 tablet (75 mg total) by mouth 2 (two) times daily. 60 tablet 1  . fluconazole (DIFLUCAN) 150 MG tablet  Please take 1 tablet by mouth today and may repeat in 3 days if needed 2 tablet 0  . Multiple Vitamin (MULTIVITAMIN) tablet Take 1 tablet by mouth daily.    . SUMAtriptan (IMITREX) 50 MG tablet TAKE 1 TABLET BY MOUTH BY MOUTH AS NEEDED FOR MIGRAINE HEADACHE. MAY REPEAT IN 2 HOURS AND TAKE 1 TABLET IF NEEDED. 10 tablet 5   No current facility-administered medications on file prior to visit.    Review of Systems:  As per HPI- otherwise negative.   Physical Examination: There were no vitals filed for this visit. There were no vitals filed for this visit. There is no height or weight on file to calculate BMI. Ideal Body Weight:    GEN: no acute distress. HEENT: Atraumatic, Normocephalic.  Ears and Nose: No external deformity. CV: RRR, No M/G/R. No JVD. No thrill. No extra heart sounds. PULM: CTA B, no wheezes, crackles, rhonchi. No retractions. No resp. distress. No accessory muscle use. ABD: S, NT, ND, +BS. No rebound. No HSM. EXTR: No c/c/e PSYCH: Normally interactive. Conversant.    Assessment and Plan: *** This visit occurred during the SARS-CoV-2 public health emergency.  Safety protocols were in place, including screening questions prior to the visit, additional usage of staff PPE, and extensive cleaning of exam room while observing appropriate  contact time as indicated for disinfecting solutions.    Signed Abbe Amsterdam, MD

## 2020-07-10 ENCOUNTER — Encounter: Payer: Self-pay | Admitting: Family Medicine

## 2020-07-10 ENCOUNTER — Ambulatory Visit: Payer: No Typology Code available for payment source | Admitting: Family Medicine

## 2020-07-10 ENCOUNTER — Ambulatory Visit (INDEPENDENT_AMBULATORY_CARE_PROVIDER_SITE_OTHER): Payer: No Typology Code available for payment source | Admitting: Family Medicine

## 2020-07-10 ENCOUNTER — Other Ambulatory Visit: Payer: Self-pay

## 2020-07-10 ENCOUNTER — Other Ambulatory Visit (HOSPITAL_COMMUNITY)
Admission: RE | Admit: 2020-07-10 | Discharge: 2020-07-10 | Disposition: A | Discharge status: Home / Self Care | Payer: No Typology Code available for payment source | Source: Ambulatory Visit | Attending: Family Medicine | Admitting: Family Medicine

## 2020-07-10 VITALS — BP 120/64 | HR 82 | Resp 16 | Ht 67.5 in | Wt 140.0 lb

## 2020-07-10 DIAGNOSIS — R350 Frequency of micturition: Secondary | ICD-10-CM | POA: Diagnosis not present

## 2020-07-10 DIAGNOSIS — N898 Other specified noninflammatory disorders of vagina: Secondary | ICD-10-CM

## 2020-07-10 LAB — POCT URINALYSIS DIP (MANUAL ENTRY)
Bilirubin, UA: NEGATIVE
Blood, UA: NEGATIVE
Glucose, UA: NEGATIVE mg/dL
Leukocytes, UA: NEGATIVE
Nitrite, UA: NEGATIVE
Spec Grav, UA: >=1.03 — AB (ref 1.010–1.025)
Urobilinogen, UA: 0.2 E.U./dL
pH, UA: 5 (ref 5.0–8.0)

## 2020-07-10 MED ORDER — CEPHALEXIN 500 MG PO CAPS: 500.0000 mg | ORAL_CAPSULE | Freq: Two times a day (BID) | ORAL | 0 refills | Status: DC

## 2020-07-10 MED ORDER — FLUCONAZOLE 150 MG PO TABS: ORAL_TABLET | ORAL | 0 refills | Status: DC

## 2020-07-10 NOTE — Patient Instructions (Addendum)
It was nice to see you today, I will be in touch with you urine culture soon as possible I will be in touch with your urine culture and swab asap You can start on the keflex now if you like Can use diflucan every 72 hours as needed

## 2020-07-10 NOTE — Progress Notes (Signed)
[facility] [address], [address] [place], [place] [address] [phone]  Date:  07/10/2020   Name:  Maria Hernandez   DOB:  09/22/1975   MRN:  [id number]  PCP:  Sandford Craze, NP    Chief Complaint: Urinary Tract Infection   History of Present Illness:  Maria Hernandez is a 45 y.o. very pleasant female patient who presents with the following:  Generally healthy young woman with history of migraine headache, pt of Melissa-I have not seen her myself in the past. She called and we rx diflucan a couple of days ago  Here today with concern of possible UTI- she notes burning and irritation in her vulvar area- she is not sure exactly where this is coming from;- possible yeast vs UTI  No hematuria No fever, no back or abd pain No vomiting LMP was 12/18 She did a UTI test at home and her urine was positive for leuk  covid booster: done, 10/1 Flu: done also   She saw my partner Dr. Drue Novel in October with concern of vaginal discharge and possible UTI Urine culture at that time was negative She did have a positive urine culture in August, [facility] Pt denies new partners or risk of STI  Patient Active Problem List   Diagnosis Date Noted  . Knee pain, bilateral 07/16/2019  . Chronic right-sided low back pain without sciatica 05/09/2019  . Migraines 06/07/2017  . Hearing loss 06/07/2017    Past Medical History:  Diagnosis Date  . Migraines     Past Surgical History:  Procedure Laterality Date  . AUGMENTATION MAMMAPLASTY    . BREAST ENHANCEMENT SURGERY Bilateral 2005  . INNER EAR SURGERY Left    Reports 2 prior ear surgeries for prosthesis. 3rd surgery coming up in January 2019  . MANDIBLE OSTEOTOMY  1995    Social History   Tobacco Use  . Smoking status: Never Smoker  . Smokeless tobacco: Never Used  Vaping Use  . Vaping Use: Never used  Substance Use Topics  . Alcohol use: Yes    Comment: occasional wine "1- 2  per month"  . Drug use: Never    Family History  Problem Relation Age of Onset  . Hypothyroidism Mother   . Hypertension Father   . Diabetes type II Father        BKA  . Breast cancer Maternal Grandmother     Allergies  Allergen Reactions  . Verapamil Hives  . Macrobid [Nitrofurantoin Macrocrystal]     Light headed  . Metronidazole Other (See Comments)    Medication list has been reviewed and updated.  Current Outpatient Medications on File Prior to Visit  Medication Sig Dispense Refill  . diclofenac (VOLTAREN) 75 MG EC tablet Take 1 tablet (75 mg total) by mouth 2 (two) times daily. 60 tablet 1  . Multiple Vitamin (MULTIVITAMIN) tablet Take 1 tablet by mouth daily.    . SUMAtriptan (IMITREX) 50 MG tablet TAKE 1 TABLET BY MOUTH BY MOUTH AS NEEDED FOR MIGRAINE HEADACHE. MAY REPEAT IN 2 HOURS AND TAKE 1 TABLET IF NEEDED. 10 tablet 5   No current facility-administered medications on file prior to visit.    Review of Systems:  As per HPI- otherwise negative.   Physical Examination: Vitals:   07/10/20 1301  BP: 120/64  Pulse: 82  Resp: 16  SpO2: 97%   Vitals:   07/10/20 1301  Weight: 140 lb (63.5 kg)  Height: 5' 7.5" (1.715 m)   Body mass index is 21.6 kg/m. Ideal Body Weight: Weight in (lb) to have BMI = 25: 161.7  GEN: no acute distress. HEENT: Atraumatic, Normocephalic.  Ears and Nose: No external deformity. CV: RRR, No M/G/R. No JVD. No thrill. No extra heart sounds. PULM: CTA B, no wheezes, crackles, rhonchi. No retractions. No resp. distress. No accessory muscle use. ABD: S, NT, ND, +BS. No rebound. No HSM. EXTR: No c/c/e PSYCH: Normally interactive. Conversant.  GU: normal exam, no irritation or redness,  Some thick discharge in vault  Assessment and Plan: Urinary frequency - Plan: Urine Culture, POCT urinalysis dipstick, cephALEXin (KEFLEX) 500 MG capsule  Vaginal irritation - Plan: Cervicovaginal ancillary only( [place]), fluconazole  (DIFLUCAN) 150 MG tablet pt here today with vaginal or urianry sx- she is not quite sure urinalysis benign today Can start on abx if she likes or await culture Gave back up doses of diflucan to use if needed  She will alert [place] if any change or worsening in the meantime  Will plan further follow- up pending labs.  This visit occurred during the SARS-CoV-2 public health emergency.  Safety protocols were in place, including screening questions prior to the visit, additional usage of staff PPE, and extensive cleaning of exam room while observing appropriate contact time as indicated for disinfecting solutions.    Signed Abbe Amsterdam, MD

## 2020-07-11 ENCOUNTER — Encounter: Payer: Self-pay | Admitting: Family Medicine

## 2020-07-11 LAB — URINE CULTURE
MICRO NUMBER:: 11390012
SPECIMEN QUALITY:: ADEQUATE

## 2020-07-11 LAB — CERVICOVAGINAL ANCILLARY ONLY
Bacterial Vaginitis (gardnerella): NEGATIVE
Candida Glabrata: NEGATIVE
Candida Vaginitis: POSITIVE — AB
Comment: NEGATIVE
Comment: NEGATIVE
Comment: NEGATIVE

## 2020-07-14 ENCOUNTER — Encounter: Payer: Self-pay | Admitting: Family Medicine

## 2020-07-16 ENCOUNTER — Other Ambulatory Visit: Payer: Self-pay | Admitting: Family

## 2020-07-30 ENCOUNTER — Encounter: Payer: Self-pay | Admitting: Family Medicine

## 2020-07-30 ENCOUNTER — Encounter: Payer: Self-pay | Admitting: Physical Therapy

## 2020-07-30 DIAGNOSIS — N898 Other specified noninflammatory disorders of vagina: Secondary | ICD-10-CM

## 2020-07-30 MED ORDER — FLUCONAZOLE 150 MG PO TABS: ORAL_TABLET | ORAL | 0 refills | Status: DC

## 2020-08-02 NOTE — Patient Instructions (Incomplete)
It was good to see you again today, please let me know if you do not start getting better

## 2020-08-02 NOTE — Progress Notes (Deleted)
[name] [address] [place], [place] [address] [phone]  Date:  08/04/2020   Name:  Maria Hernandez   DOB:  12-17-1975   MRN:  [id number]  PCP:  Sandford Craze, NP    Chief Complaint: No chief complaint on file.   History of Present Illness:  Maria Hernandez is a 45 y.o. very pleasant female patient who presents with the following:  Patient here today for follow-up of vaginal irritation Generally in good health I saw her last on January 6-at that time she had a benign UA, thick discharge in vaginal vault.  We treated her with Diflucan Wet prep swab was positive for Candida Urine culture negative  Since that time she has noted continued vaginal irritation.  She was using a Monistat cream topically, we wonder this could be actually making her worse. I sent in Diflucan for her again on January 26 in hopes that this might be helpful Patient Active Problem List   Diagnosis Date Noted  . Knee pain, bilateral 07/16/2019  . Chronic right-sided low back pain without sciatica 05/09/2019  . Migraines 06/07/2017  . Hearing loss 06/07/2017    Past Medical History:  Diagnosis Date  . Migraines     Past Surgical History:  Procedure Laterality Date  . AUGMENTATION MAMMAPLASTY    . BREAST ENHANCEMENT SURGERY Bilateral 2005  . INNER EAR SURGERY Left    Reports 2 prior ear surgeries for prosthesis. 3rd surgery coming up in January 2019  . MANDIBLE OSTEOTOMY  1995    Social History   Tobacco Use  . Smoking status: Never Smoker  . Smokeless tobacco: Never Used  Vaping Use  . Vaping Use: Never used  Substance Use Topics  . Alcohol use: Yes    Comment: occasional wine "1- 2 per month"  . Drug use: Never    Family History  Problem Relation Age of Onset  . Hypothyroidism Mother   . Hypertension Father   . Diabetes type II Father        BKA  . Breast cancer Maternal Grandmother     Allergies   Allergen Reactions  . Verapamil Hives  . Macrobid [Nitrofurantoin Macrocrystal]     Light headed  . Metronidazole Other (See Comments)    Medication list has been reviewed and updated.  Current Outpatient Medications on File Prior to Visit  Medication Sig Dispense Refill  . cephALEXin (KEFLEX) 500 MG capsule Take 1 capsule (500 mg total) by mouth 2 (two) times daily. 14 capsule 0  . diclofenac (VOLTAREN) 75 MG EC tablet Take 1 tablet (75 mg total) by mouth 2 (two) times daily. 60 tablet 1  . fluconazole (DIFLUCAN) 150 MG tablet Please take 1 tablet by mouth today and may repeat in 3 days if needed 2 tablet 0  . Multiple Vitamin (MULTIVITAMIN) tablet Take 1 tablet by mouth daily.    . SUMAtriptan (IMITREX) 50 MG tablet TAKE 1 TABLET BY  MOUTH AS NEEDED FOR MIGRAINE HEADACHE. MAY REPEAT IN 2 HOURS AND TAKE 1 TABLET AS NEEDED. SEE DR FOR FURTHER REFILLS 10 tablet 0   No current facility-administered medications on file prior to visit.    Review of Systems:  As per HPI- otherwise negative.   Physical Examination: There were no vitals filed for this visit. There were no vitals filed for this visit. There is no height or weight on file to calculate BMI. Ideal Body Weight:  GEN: no acute distress. HEENT: Atraumatic, Normocephalic.  Ears and Nose: No external deformity. CV: RRR, No M/G/R. No JVD. No thrill. No extra heart sounds. PULM: CTA B, no wheezes, crackles, rhonchi. No retractions. No resp. distress. No accessory muscle use. ABD: S, NT, ND, +BS. No rebound. No HSM. EXTR: No c/c/e PSYCH: Normally interactive. Conversant.    Assessment and Plan: ***  This visit occurred during the SARS-CoV-2 public health emergency.  Safety protocols were in place, including screening questions prior to the visit, additional usage of staff PPE, and extensive cleaning of exam room while observing appropriate contact time as indicated for disinfecting solutions.    Signed Abbe Amsterdam, MD

## 2020-08-04 ENCOUNTER — Ambulatory Visit: Payer: No Typology Code available for payment source | Admitting: Family Medicine

## 2020-08-18 ENCOUNTER — Ambulatory Visit (INDEPENDENT_AMBULATORY_CARE_PROVIDER_SITE_OTHER): Payer: No Typology Code available for payment source | Admitting: Orthopaedic Surgery

## 2020-08-18 DIAGNOSIS — G8929 Other chronic pain: Secondary | ICD-10-CM

## 2020-08-18 DIAGNOSIS — M25561 Pain in right knee: Secondary | ICD-10-CM | POA: Diagnosis not present

## 2020-08-18 DIAGNOSIS — M25562 Pain in left knee: Secondary | ICD-10-CM

## 2020-08-18 NOTE — Progress Notes (Signed)
The patient is actually patient 1 my partners.  She is seeing me today for second opinion as a relates to chronic bilateral knee pain.  She is 45 years old.  Her left knee is the one that hurts her worse.  She has had at least 2 MRIs of the left knee.  She has been to physical therapy.  She is work on activity modification.  She has had steroid injections as well.  None of this helps at all.  Her knee hurts on a daily basis whether she is active or not.  It is worse with activities.  She points to the medial joint line and the medial femoral condyle as a source of her left knee pain.  On examination of her left knee there is no effusion.  There is pain to palpation of the medial femoral condyle but I cannot feel a specific plica in this area at the flexion extension.  Her knee is ligamentously stable with no effusion and full range of motion.  I did review the MRI of her knee that was more recent.  This is a normal-appearing MRI of the left knee.  There was no edema around the medial compartment the knee and I did not see any plica.  The MRI from earlier of her left knee suggest a medial plica.  I am not sure as a source of her pain with her left knee.  I would not recommend an arthroscopic intervention for now given the fact that even injections have not helped.  Certainly that would be the last step to perform an arthroscopic intervention for the left knee but the patient understands that there is a good chance that we would find nothing with that knee.  For now would recommend possibly just trying a copper fit knee sleeve and right now avoid any elliptical or activities involving going up and down hills or stairs.  If this worsens anyway she will follow back up with my partner.  I am also happy to see her back.

## 2020-09-04 ENCOUNTER — Encounter: Payer: Self-pay | Admitting: Family

## 2020-09-06 NOTE — Progress Notes (Signed)
[facility] at [facility] [address], [address] [place], [place] [address] [phone]  Date:  09/08/2020   Name:  Maria Hernandez   DOB:  [birth year]   MRN:  [id number]  PCP:  Sandford Craze, NP    Chief Complaint: Gynecologic Exam (PAP and STD check )   History of Present Illness:  Maria Hernandez is a 45 y.o. very pleasant female patient who presents with the following:  Pt with history of migraine headache, here today for pap screening Most recent pap 2019- negative  She had an abnormal Pap once in high school- ok since   LMP was approximately February 14 She has no other concerns today and is feeling well Patient Active Problem List   Diagnosis Date Noted  . Knee pain, bilateral 07/16/2019  . Chronic right-sided low back pain without sciatica 05/09/2019  . Migraines 06/07/2017  . Hearing loss 06/07/2017    Past Medical History:  Diagnosis Date  . Migraines     Past Surgical History:  Procedure Laterality Date  . AUGMENTATION MAMMAPLASTY    . BREAST ENHANCEMENT SURGERY Bilateral 2005  . INNER EAR SURGERY Left    Reports 2 prior ear surgeries for prosthesis. 3rd surgery coming up in January 2019  . MANDIBLE OSTEOTOMY  1995    Social History   Tobacco Use  . Smoking status: Never Smoker  . Smokeless tobacco: Never Used  Vaping Use  . Vaping Use: Never used  Substance Use Topics  . Alcohol use: Yes    Comment: occasional wine "1- 2 per month"  . Drug use: Never    Family History  Problem Relation Age of Onset  . Hypothyroidism Mother   . Hypertension Father   . Diabetes type II Father        BKA  . Breast cancer Maternal Grandmother     Allergies  Allergen Reactions  . Verapamil Hives  . Macrobid [Nitrofurantoin Macrocrystal]     Light headed  . Metronidazole Other (See Comments)    Medication list has been reviewed and updated.  Current Outpatient Medications on File Prior to Visit  Medication  Sig Dispense Refill  . diclofenac (VOLTAREN) 75 MG EC tablet Take 1 tablet (75 mg total) by mouth 2 (two) times daily. 60 tablet 1  . Multiple Vitamin (MULTIVITAMIN) tablet Take 1 tablet by mouth daily.    . SUMAtriptan (IMITREX) 50 MG tablet TAKE 1 TABLET BY  MOUTH AS NEEDED FOR MIGRAINE HEADACHE. MAY REPEAT IN 2 HOURS AND TAKE 1 TABLET AS NEEDED. SEE DR FOR FURTHER REFILLS 10 tablet 0   No current facility-administered medications on file prior to visit.    Review of Systems:  As per HPI- otherwise negative.   Physical Examination: Vitals:   09/08/20 1325  BP: 130/76  Pulse: 72  Temp: 97.6 F (36.4 C)  SpO2: 99%   Vitals:   09/08/20 1325  Weight: 146 lb 9.6 oz (66.5 kg)  Height: 5\' 7"  (1.702 m)   Body mass index is 22.96 kg/m. Ideal Body Weight: Weight in (lb) to have BMI = 25: 159.3  GEN: no acute distress.  Normal weight, looks well HEENT: Atraumatic, Normocephalic.  Ears and Nose: No external deformity. CV: RRR, No M/G/R. No JVD. No thrill. No extra heart sounds. PULM: CTA B, no wheezes, crackles, rhonchi. No retractions. No resp. distress. No accessory muscle use. EXTR: No c/c/e PSYCH: Normally interactive. Conversant.  Pap collected today.  Normal vulva and cervix, normal vaginal canal.  Patient is just starting to spot with upcoming menses   Assessment and Plan: Screening for cervical cancer - Plan: Cytology - PAP  Cervical cancer screening performed with Pap.  We will be in touch with patient pending her results This visit occurred during the SARS-CoV-2 public health emergency.  Safety protocols were in place, including screening questions prior to the visit, additional usage of staff PPE, and extensive cleaning of exam room while observing appropriate contact time as indicated for disinfecting solutions.    Signed Abbe Amsterdam, MD

## 2020-09-06 NOTE — Patient Instructions (Addendum)
I will be in touch with your pap asap !  Take care

## 2020-09-08 ENCOUNTER — Other Ambulatory Visit (HOSPITAL_COMMUNITY)
Admission: RE | Admit: 2020-09-08 | Discharge: 2020-09-08 | Disposition: A | Discharge status: Home / Self Care | Payer: No Typology Code available for payment source | Source: Ambulatory Visit | Attending: Family Medicine | Admitting: Family Medicine

## 2020-09-08 ENCOUNTER — Encounter: Payer: Self-pay | Admitting: Family Medicine

## 2020-09-08 ENCOUNTER — Ambulatory Visit (INDEPENDENT_AMBULATORY_CARE_PROVIDER_SITE_OTHER): Payer: No Typology Code available for payment source | Admitting: Family Medicine

## 2020-09-08 ENCOUNTER — Other Ambulatory Visit: Payer: Self-pay

## 2020-09-08 VITALS — BP 130/76 | HR 72 | Temp 97.6°F | Ht 67.0 in | Wt 146.6 lb

## 2020-09-08 DIAGNOSIS — Z124 Encounter for screening for malignant neoplasm of cervix: Secondary | ICD-10-CM | POA: Diagnosis present

## 2020-09-09 ENCOUNTER — Encounter: Payer: Self-pay | Admitting: Family Medicine

## 2020-09-09 LAB — CYTOLOGY - PAP
Comment: NEGATIVE
Diagnosis: NEGATIVE
High risk HPV: NEGATIVE

## 2020-09-12 ENCOUNTER — Ambulatory Visit: Payer: No Typology Code available for payment source | Admitting: Family

## 2020-10-09 ENCOUNTER — Other Ambulatory Visit: Payer: Self-pay | Admitting: Family

## 2020-10-20 ENCOUNTER — Other Ambulatory Visit (HOSPITAL_BASED_OUTPATIENT_CLINIC_OR_DEPARTMENT_OTHER): Payer: Self-pay | Admitting: Family

## 2020-10-20 DIAGNOSIS — Z1231 Encounter for screening mammogram for malignant neoplasm of breast: Secondary | ICD-10-CM

## 2020-11-10 ENCOUNTER — Other Ambulatory Visit: Payer: Self-pay

## 2020-11-10 ENCOUNTER — Encounter (HOSPITAL_BASED_OUTPATIENT_CLINIC_OR_DEPARTMENT_OTHER): Payer: Self-pay

## 2020-11-10 ENCOUNTER — Ambulatory Visit (HOSPITAL_BASED_OUTPATIENT_CLINIC_OR_DEPARTMENT_OTHER)
Admission: RE | Admit: 2020-11-10 | Discharge: 2020-11-10 | Disposition: A | Discharge status: Home / Self Care | Payer: No Typology Code available for payment source | Source: Ambulatory Visit | Attending: Family | Admitting: Family

## 2020-11-10 DIAGNOSIS — Z1231 Encounter for screening mammogram for malignant neoplasm of breast: Secondary | ICD-10-CM | POA: Diagnosis present

## 2020-11-13 ENCOUNTER — Other Ambulatory Visit: Payer: Self-pay | Admitting: Family

## 2021-01-03 ENCOUNTER — Other Ambulatory Visit: Payer: Self-pay | Admitting: Family

## 2021-01-12 IMAGING — XA DG FLUORO GUIDE NDL PLC/BX
1 series · 1 of 1 positions shown · non-contrast
Comparison: none

CLINICAL DATA: Chronic left knee pain.

[Series 1: ortho standard · 1 of 1 slices shown]
[im 1/1]
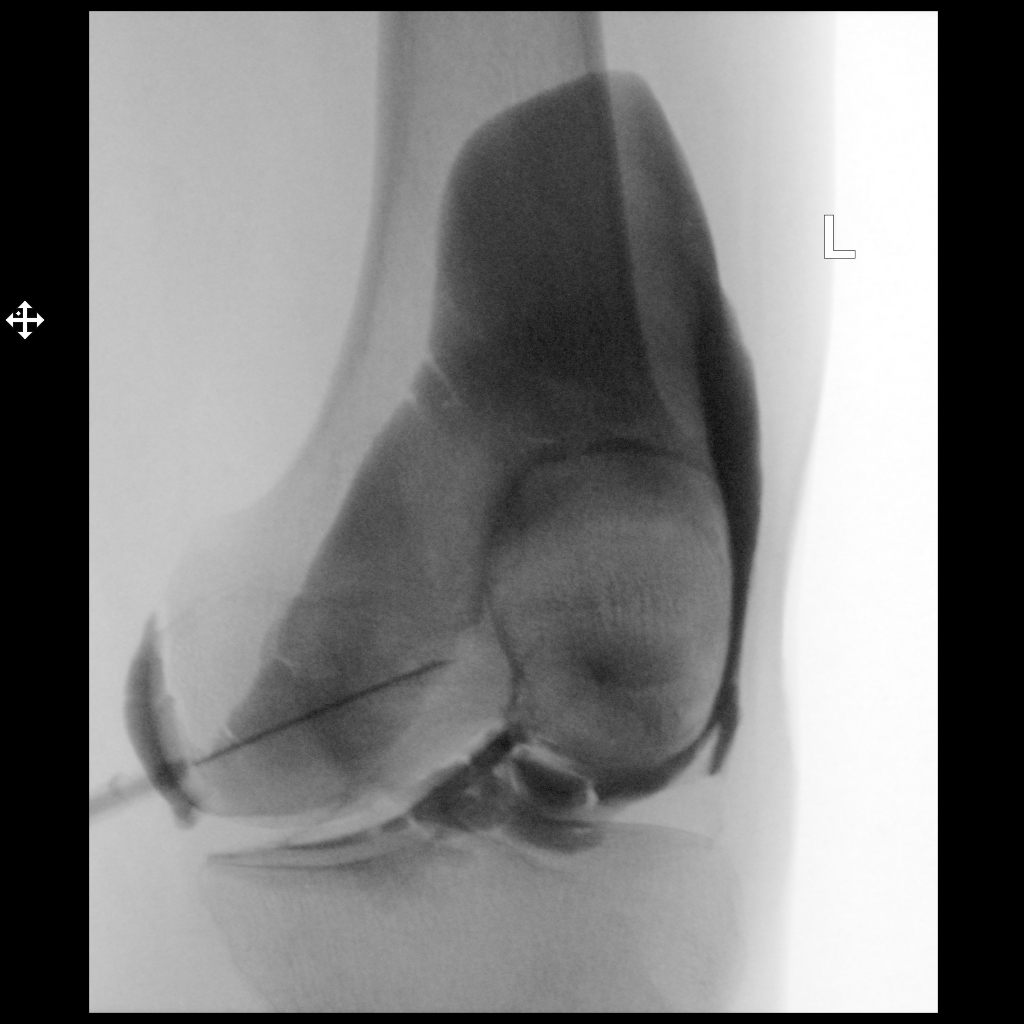

[1 of 1 positions shown; findings below may reference images not displayed]

FLUOROSCOPY TIME:  Radiation Exposure Index (as provided by the
fluoroscopic device): 0.4 mGy

Fluoroscopy Time:  12 seconds

Number of Acquired Images:  0

PROCEDURE:
The risks and benefits of the procedure were discussed with the
patient, and written informed consent was obtained. The patient
stated no history of allergy to contrast media. A formal timeout
procedure was performed with the patient according to departmental
protocol.

The patient was placed supine on the fluoroscopy table and the left
knee joint was identified under fluoroscopy. The skin overlying the
left knee joint was subsequently cleaned with Betadine and a sterile
drape was placed over the area of interest. 2 ml 1% Lidocaine was
used to anesthetize the skin around the needle insertion site.

A 20 gauge needle was inserted into the left knee joint under
fluoroscopy.

30 ml of gadolinium mixture (0.2 ml of Multihance mixed with 30 ml
of Isovue-M 200 contrast and 10 ml of sterile saline) were injected
into the left knee joint.

The needle was removed and hemostasis was achieved. The patient was
subsequently transferred to MRI for imaging.
IMPRESSION: Technically successful left knee injection for MRI.

## 2021-01-12 IMAGING — MR MR KNEE*L* W/ CM
4 of 6 series · 24 of 40 positions shown · IV contrast (agent unspecified)
Comparison: MRI left knee dated May 16, 2019.

CLINICAL DATA: Chronic left knee pain.

EXAM:
MRI OF THE LEFT KNEE WITH CONTRAST (MR ARTHROGRAM)
TECHNIQUE: Multiplanar, multisequence MR imaging of the knee was performed
after the administration of intra-articular contrast.
CONTRAST:  See injection documentation.

[Series 3: t1_tse_ax fs · axial · 4.0mm · 0.31mm/px · z∈[-101,+34]mm · 6 of 28 slices shown]
[im 1/28]
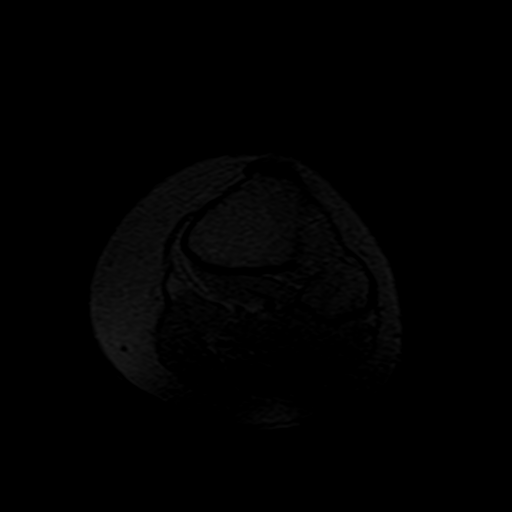
[im 6/28]
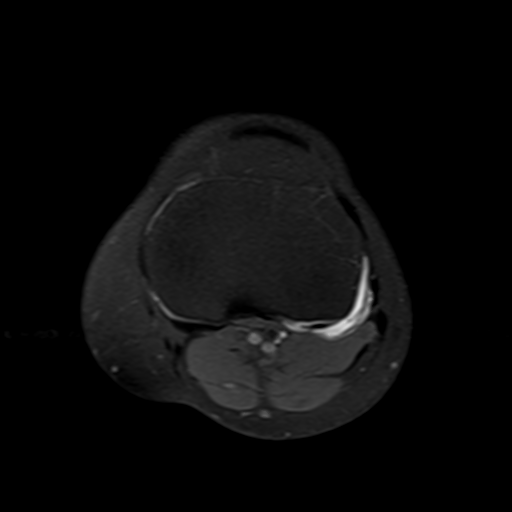
[im 11/28]
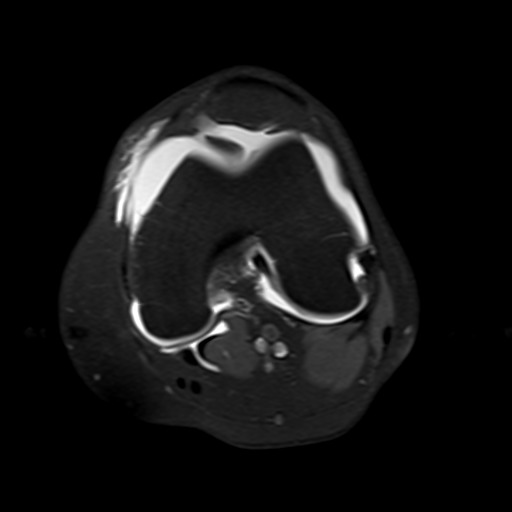
[im 17/28]
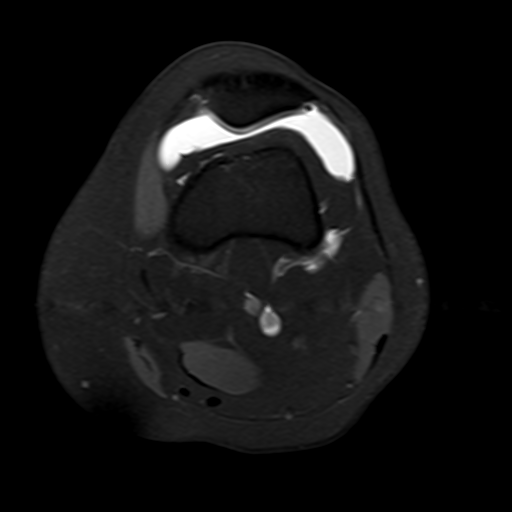
[im 22/28]
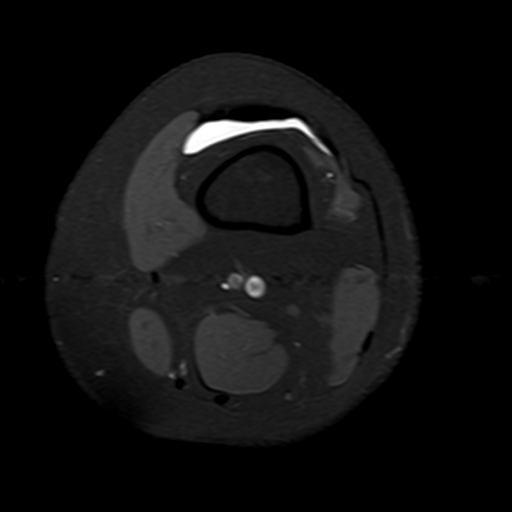
[im 28/28]
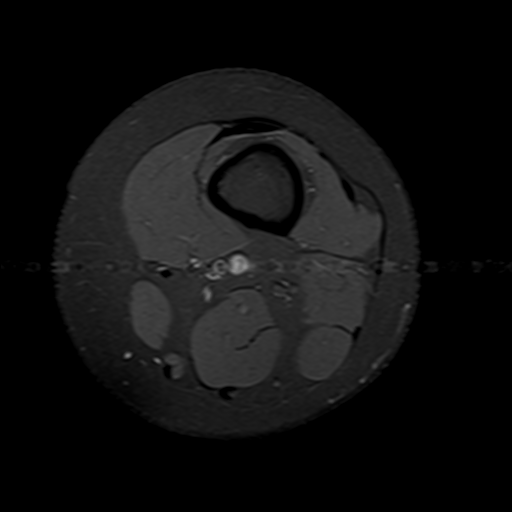

[Series 4: t1_tse_cor_fs · coronal · 4.0mm · 0.31mm/px · 4 of 28 slices shown]
[im 1/28]
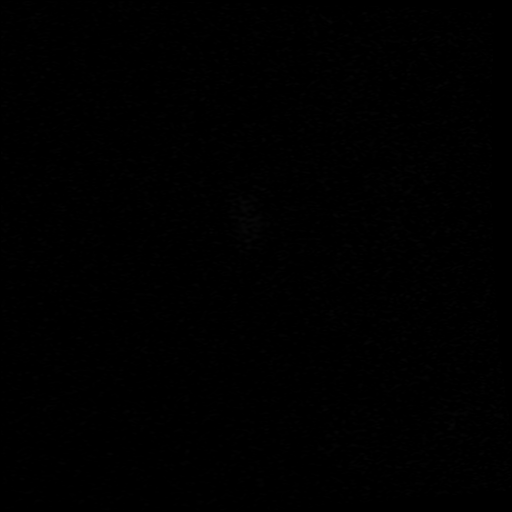
[im 5/28]
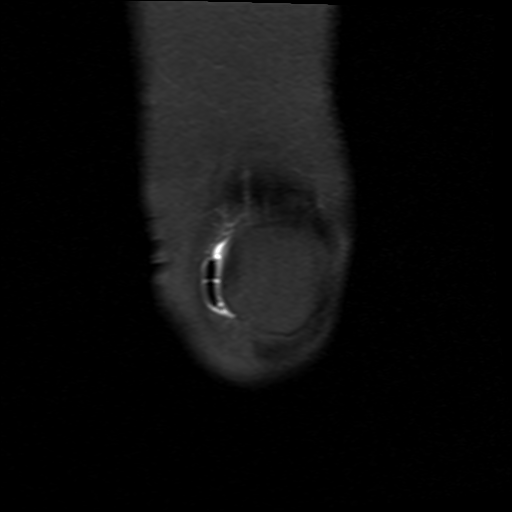
[im 14/28]
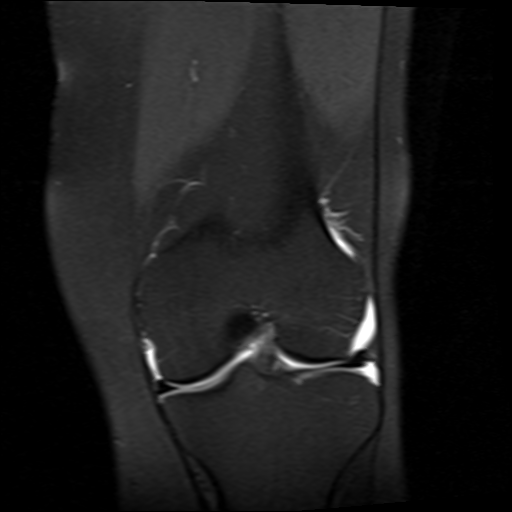
[im 23/28]
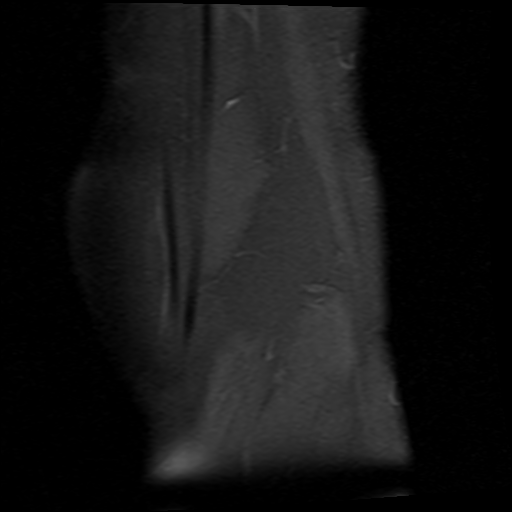

[Series 5: T2 fat-sat · coronal · 4.0mm · 0.62mm/px · 7 of 28 slices shown]
[im 1/28]
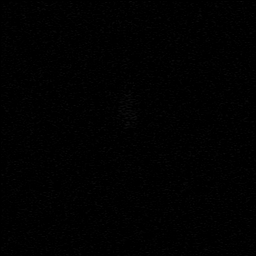
[im 5/28]
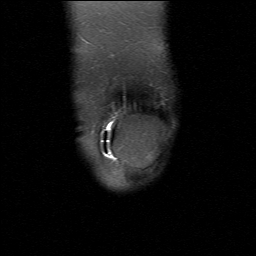
[im 10/28]
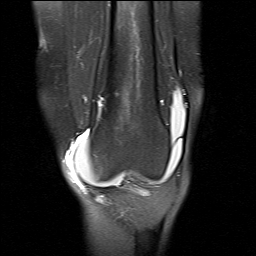
[im 14/28]
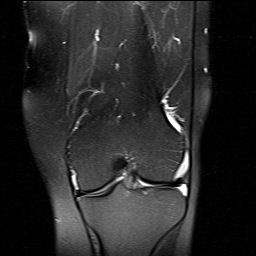
[im 19/28]
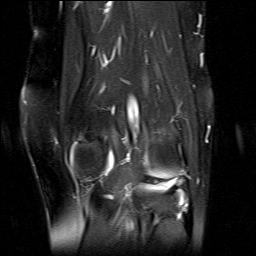
[im 23/28]
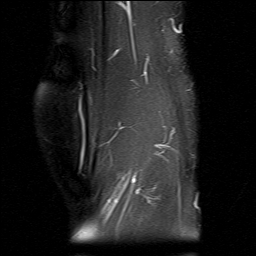
[im 28/28]
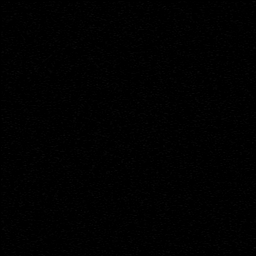

[Series 8: T1 · coronal · 4.0mm · 0.31mm/px · 7 of 28 slices shown]
[im 1/28]
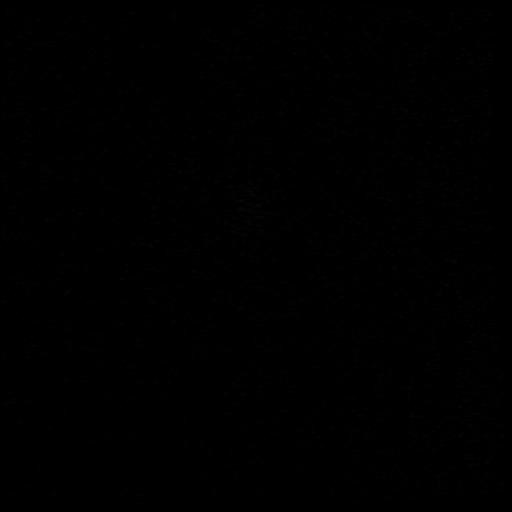
[im 5/28]
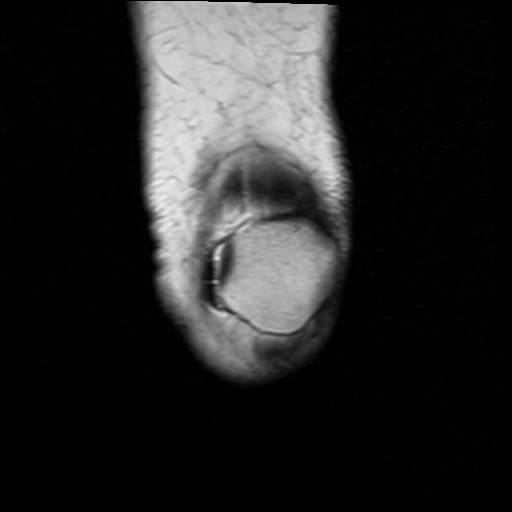
[im 10/28]
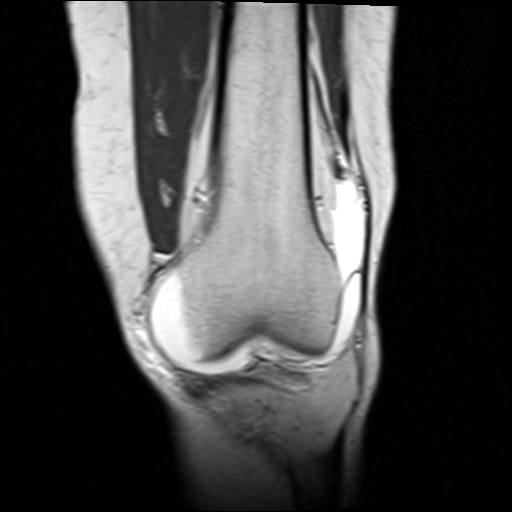
[im 14/28]
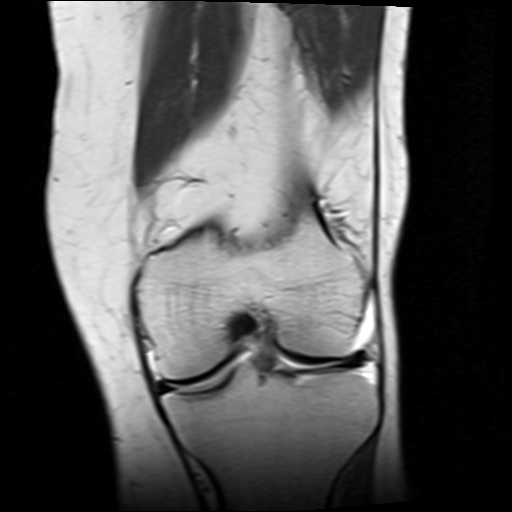
[im 19/28]
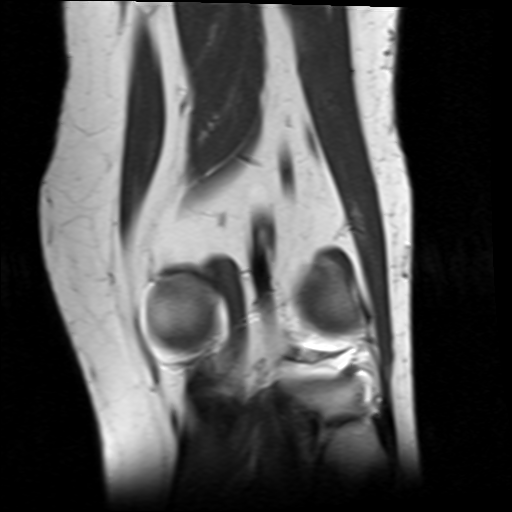
[im 23/28]
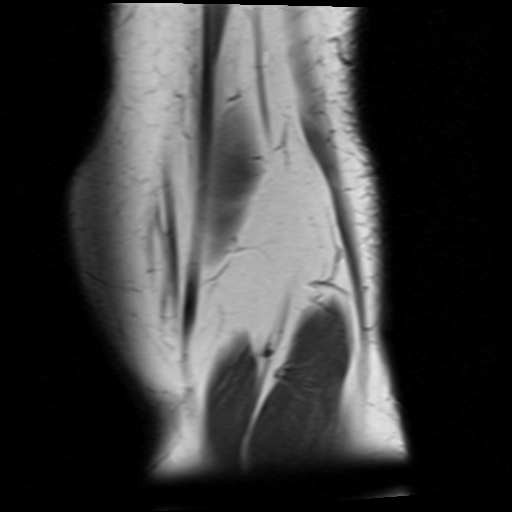
[im 28/28]
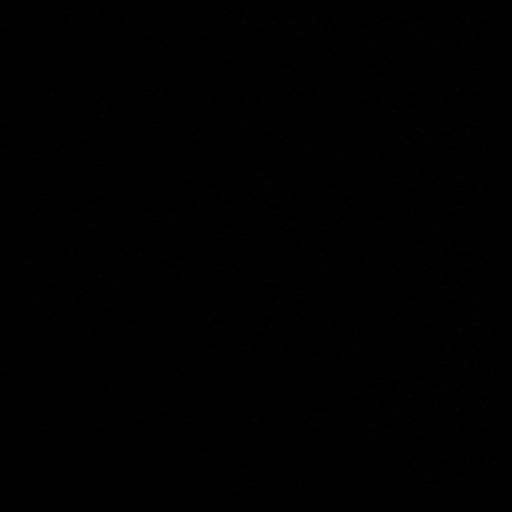

[24 of 40 positions shown; findings below may reference images not displayed]

FINDINGS: MENISCI

Medial meniscus:  Intact.

Lateral meniscus:  Intact.

LIGAMENTS

Cruciates:  Intact ACL and PCL.

Collaterals: Medial collateral ligament is intact. Lateral
collateral ligament complex is intact.

CARTILAGE

Patellofemoral:  Normal.

Medial:  Normal.

Lateral:  Normal.

Joint: Distended with intra-articular contrast. Normal Hoffa's fat.

Popliteal Fossa:  Tiny Baker cyst.  Intact popliteus tendon.

Extensor Mechanism: Intact quadriceps tendon and patellar tendon.
Intact medial and lateral patellar retinaculum. Intact MPFL.

Bones: No focal marrow signal abnormality. No fracture or
dislocation.

Other: None.
IMPRESSION: 1. No meniscal or ligamentous injury.
2. Tiny Baker cyst.

## 2021-01-19 ENCOUNTER — Ambulatory Visit: Payer: No Typology Code available for payment source | Admitting: Internal Medicine

## 2021-01-22 ENCOUNTER — Encounter: Payer: Self-pay | Admitting: Family

## 2021-01-23 ENCOUNTER — Other Ambulatory Visit: Payer: Self-pay

## 2021-01-23 ENCOUNTER — Ambulatory Visit
Admission: RE | Admit: 2021-01-23 | Discharge: 2021-01-23 | Disposition: A | Discharge status: Home / Self Care | Payer: No Typology Code available for payment source | Source: Ambulatory Visit | Attending: Emergency Medicine | Admitting: Emergency Medicine

## 2021-01-23 ENCOUNTER — Encounter: Payer: Self-pay | Admitting: Family

## 2021-01-23 VITALS — BP 117/82 | HR 61 | Temp 98.3°F | Resp 18 | Wt 145.0 lb

## 2021-01-23 DIAGNOSIS — R109 Unspecified abdominal pain: Secondary | ICD-10-CM | POA: Diagnosis not present

## 2021-01-23 DIAGNOSIS — R350 Frequency of micturition: Secondary | ICD-10-CM | POA: Diagnosis present

## 2021-01-23 LAB — POCT URINALYSIS DIP (MANUAL ENTRY)
Bilirubin, UA: NEGATIVE
Blood, UA: NEGATIVE
Glucose, UA: NEGATIVE mg/dL
Ketones, POC UA: NEGATIVE mg/dL
Leukocytes, UA: NEGATIVE
Nitrite, UA: POSITIVE — AB
Protein Ur, POC: NEGATIVE mg/dL
Spec Grav, UA: 1.015 (ref 1.010–1.025)
Urobilinogen, UA: 0.2 E.U./dL
pH, UA: 7 (ref 5.0–8.0)

## 2021-01-23 MED ORDER — FLUCONAZOLE 150 MG PO TABS: 150.0000 mg | ORAL_TABLET | Freq: Once | ORAL | 0 refills | Status: AC

## 2021-01-23 MED ORDER — CEPHALEXIN 500 MG PO CAPS: 500.0000 mg | ORAL_CAPSULE | Freq: Two times a day (BID) | ORAL | 0 refills | Status: AC

## 2021-01-23 NOTE — Telephone Encounter (Signed)
Pt at [facility].  

## 2021-01-23 NOTE — ED Triage Notes (Signed)
Thinks maybe having bladder issues maybe cystitis.  Does not burn when she urinates. Pt did take AZO.  Hurts when bladder fills up, has been going on for about 2 weeks.

## 2021-01-23 NOTE — Discharge Instructions (Addendum)
Begin Keflex twice daily x5 days, stop if culture negative Diflucan if needed for yeast Urine culture and swab pending for further evaluation of any urinary/vaginal infections Follow-up with primary care or urology if symptoms persistent

## 2021-01-23 NOTE — ED Provider Notes (Signed)
[facility]    CSN: [id number] Arrival date & time: 01/23/21  1039      History   Chief Complaint Chief Complaint  Patient presents with   Cystitis   appt 1045    HPI Maria Hernandez is a 45 y.o. female presenting today for evaluation of bladder pressure.  Patient reports that she has had continued discomfort on her bladder over the past 2 weeks, symptoms initially went away with use of Azo, but have returned recently.  Has felt discomfort in bladder with intercourse.  Denies typical dysuria that she has felt with past UTIs.  Denies vaginal symptoms of discharge itching or irritation.  Denies fevers nausea or vomiting.  HPI  Past Medical History:  Diagnosis Date   Migraines     Patient Active Problem List   Diagnosis Date Noted   Knee pain, bilateral 07/16/2019   Chronic right-sided low back pain without sciatica 05/09/2019   Migraines 06/07/2017   Hearing loss 06/07/2017    Past Surgical History:  Procedure Laterality Date   AUGMENTATION MAMMAPLASTY Bilateral    BREAST ENHANCEMENT SURGERY Bilateral 2005   INNER EAR SURGERY Left    Reports 2 prior ear surgeries for prosthesis. 3rd surgery coming up in January 2019   MANDIBLE OSTEOTOMY  1995    OB History   No obstetric history on file.      Home Medications    Prior to Admission medications   Medication Sig Start Date End Date Taking? Authorizing Provider  cephALEXin (KEFLEX) 500 MG capsule Take 1 capsule (500 mg total) by mouth 2 (two) times daily for 5 days. 01/23/21 01/28/21 Yes Diandre Merica C, PA-C  fluconazole (DIFLUCAN) 150 MG tablet Take 1 tablet (150 mg total) by mouth once for 1 dose. 01/23/21 01/23/21 Yes Senan Urey C, PA-C  diclofenac (VOLTAREN) 75 MG EC tablet Take 1 tablet (75 mg total) by mouth 2 (two) times daily. 02/12/20   Lenda Kelp, MD  Multiple Vitamin (MULTIVITAMIN) tablet Take 1 tablet by mouth daily.    [provider]  SUMAtriptan (IMITREX) 50 MG tablet  TAKE 1 TABLET BY MOUTH ONCE DAILY AS NEEDED FOR HEADACHE. MAY REPEAT IN 2 HOURS AND TAKE 1 TABLET AS NEEDED. 01/04/21   Sandford Craze, NP    Family History Family History  Problem Relation Age of Onset   Hypothyroidism Mother    Hypertension Father    Diabetes type II Father        BKA   Breast cancer Maternal Grandmother     Social History Social History   Tobacco Use   Smoking status: Never   Smokeless tobacco: Never  Vaping Use   Vaping Use: Never used  Substance Use Topics   Alcohol use: Yes    Comment: occasional wine "1- 2 per month"   Drug use: Never     Allergies   Verapamil, Macrobid [nitrofurantoin macrocrystal], and Metronidazole   Review of Systems Review of Systems  Constitutional:  Negative for fever.  Respiratory:  Negative for shortness of breath.   Cardiovascular:  Negative for chest pain.  Gastrointestinal:  Negative for abdominal pain, diarrhea, nausea and vomiting.  Genitourinary:  Positive for frequency and pelvic pain. Negative for dysuria, flank pain, genital sores, hematuria, menstrual problem, vaginal bleeding, vaginal discharge and vaginal pain.  Musculoskeletal:  Negative for back pain.  Skin:  Negative for rash.  Neurological:  Negative for dizziness, light-headedness and headaches.    Physical Exam Triage Vital Signs ED Triage  Vitals  Enc Vitals Group     BP 01/23/21 1052 117/82     Pulse Rate 01/23/21 1052 61     Resp 01/23/21 1052 18     Temp 01/23/21 1052 98.3 F (36.8 C)     Temp Source 01/23/21 1052 Oral     SpO2 01/23/21 1052 96 %     Weight 01/23/21 1052 145 lb (65.8 kg)     Height --      Head Circumference --      Peak Flow --      Pain Score 01/23/21 1051 7     Pain Loc --      Pain Edu? --      Excl. in GC? --    No data found.  Updated Vital Signs BP 117/82 (BP Location: Left Arm)   Pulse 61   Temp 98.3 F (36.8 C) (Oral)   Resp 18   Wt 145 lb (65.8 kg)   LMP 12/24/2020   SpO2 96%   BMI 22.71 kg/m    Visual Acuity Right Eye Distance:   Left Eye Distance:   Bilateral Distance:    Right Eye Near:   Left Eye Near:    Bilateral Near:     Physical Exam Vitals and nursing note reviewed.  Constitutional:      Appearance: She is well-developed.     Comments: No acute distress  HENT:     Head: Normocephalic and atraumatic.     Nose: Nose normal.  Eyes:     Conjunctiva/sclera: Conjunctivae normal.  Cardiovascular:     Rate and Rhythm: Normal rate.  Pulmonary:     Effort: Pulmonary effort is normal. No respiratory distress.  Abdominal:     General: There is no distension.  Musculoskeletal:        General: Normal range of motion.     Cervical back: Neck supple.  Skin:    General: Skin is warm and dry.  Neurological:     Mental Status: She is alert and oriented to person, place, and time.     UC Treatments / Results  Labs (all labs ordered are listed, but only abnormal results are displayed) Labs Reviewed  POCT URINALYSIS DIP (MANUAL ENTRY) - Abnormal; Notable for the following components:      Result Value   Nitrite, UA Positive (*)    All other components within normal limits  URINE CULTURE  CERVICOVAGINAL ANCILLARY ONLY    EKG   Radiology No results found.  Procedures Procedures (including critical care time)  Medications Ordered in UC Medications - No data to display  Initial Impression / Assessment and Plan / UC Course  I have reviewed the triage vital signs and the nursing notes.  Pertinent labs & imaging results that were available during my care of the patient were reviewed by me and considered in my medical decision making (see chart for details).     UA with positive nitrites, negative leuks, will send for culture, empirically treating with Keflex twice daily for 5 days, if culture negative will have stop antibiotics, vaginal swab pending to also evaluate for any vaginal infections contributing to symptoms.  Encourage follow-up with PCP or  urology if symptoms persistent and no signs of infection.  Discussed strict return precautions. Patient verbalized understanding and is agreeable with plan.  Final Clinical Impressions(s) / UC Diagnoses   Final diagnoses:  Urinary frequency  Abdominal pressure     Discharge Instructions      Begin  Keflex twice daily x5 days, stop if culture negative Diflucan if needed for yeast Urine culture and swab pending for further evaluation of any urinary/vaginal infections Follow-up with primary care or urology if symptoms persistent     ED Prescriptions     Medication Sig Dispense Auth. Provider   cephALEXin (KEFLEX) 500 MG capsule Take 1 capsule (500 mg total) by mouth 2 (two) times daily for 5 days. 20 capsule Stanislawa Gaffin C, PA-C   fluconazole (DIFLUCAN) 150 MG tablet Take 1 tablet (150 mg total) by mouth once for 1 dose. 2 tablet Cayetano Mikita, [place] C, PA-C      PDMP not reviewed this encounter.   Lew Dawes, [place] 01/23/21 1132

## 2021-01-24 LAB — URINE CULTURE: Culture: NO GROWTH

## 2021-01-25 NOTE — Progress Notes (Deleted)
[facility] at [facility] [address] [place], [place] [address] [phone]  Date:  01/29/2021   Name:  Maria Hernandez   DOB:  04/04/1976   MRN:  [id number]  PCP:  Sandford Craze, NP    Chief Complaint: No chief complaint on file.   History of Present Illness:  Maria Hernandez is a 45 y.o. very pleasant female patient who presents with the following:  Patient seen today with possible bladder infection Generally in good health, history of migraine headache Last visit with myself was in March She was seen in urgent care on July 22 with concern of dysuria She was treated empirically with Keflex, urine culture pending-culture came back negative for infection  Patient Active Problem List   Diagnosis Date Noted   Knee pain, bilateral 07/16/2019   Chronic right-sided low back pain without sciatica 05/09/2019   Migraines 06/07/2017   Hearing loss 06/07/2017    Past Medical History:  Diagnosis Date   Migraines     Past Surgical History:  Procedure Laterality Date   AUGMENTATION MAMMAPLASTY Bilateral    BREAST ENHANCEMENT SURGERY Bilateral 2005   INNER EAR SURGERY Left    Reports 2 prior ear surgeries for prosthesis. 3rd surgery coming up in January 2019   MANDIBLE OSTEOTOMY  1995    Social History   Tobacco Use   Smoking status: Never   Smokeless tobacco: Never  Vaping Use   Vaping Use: Never used  Substance Use Topics   Alcohol use: Yes    Comment: occasional wine "1- 2 per month"   Drug use: Never    Family History  Problem Relation Age of Onset   Hypothyroidism Mother    Hypertension Father    Diabetes type II Father        BKA   Breast cancer Maternal Grandmother     Allergies  Allergen Reactions   Verapamil Hives   Macrobid [Nitrofurantoin Macrocrystal]     Light headed   Metronidazole Other (See Comments)    Medication list has been reviewed and updated.  Current Outpatient Medications  on File Prior to Visit  Medication Sig Dispense Refill   cephALEXin (KEFLEX) 500 MG capsule Take 1 capsule (500 mg total) by mouth 2 (two) times daily for 5 days. 20 capsule 0   diclofenac (VOLTAREN) 75 MG EC tablet Take 1 tablet (75 mg total) by mouth 2 (two) times daily. 60 tablet 1   Multiple Vitamin (MULTIVITAMIN) tablet Take 1 tablet by mouth daily.     SUMAtriptan (IMITREX) 50 MG tablet TAKE 1 TABLET BY MOUTH ONCE DAILY AS NEEDED FOR HEADACHE. MAY REPEAT IN 2 HOURS AND TAKE 1 TABLET AS NEEDED. 10 tablet 0   No current facility-administered medications on file prior to visit.    Review of Systems:  As per HPI- otherwise negative.   Physical Examination: There were no vitals filed for this visit. There were no vitals filed for this visit. There is no height or weight on file to calculate BMI. Ideal Body Weight:    GEN: no acute distress. HEENT: Atraumatic, Normocephalic.  Ears and Nose: No external deformity. CV: RRR, No M/G/R. No JVD. No thrill. No extra heart sounds. PULM: CTA B, no wheezes, crackles, rhonchi. No retractions. No resp. distress. No accessory muscle use. ABD: S, NT, ND, +BS. No rebound. No HSM. EXTR: No c/c/e PSYCH: Normally interactive. Conversant.    Assessment and Plan: ***  This  visit occurred during the SARS-CoV-2 public health emergency.  Safety protocols were in place, including screening questions prior to the visit, additional usage of staff PPE, and extensive cleaning of exam room while observing appropriate contact time as indicated for disinfecting solutions.   Signed Abbe Amsterdam, MD

## 2021-01-26 ENCOUNTER — Encounter: Payer: Self-pay | Admitting: Family

## 2021-01-26 LAB — CERVICOVAGINAL ANCILLARY ONLY
Bacterial Vaginitis (gardnerella): NEGATIVE
Candida Glabrata: NEGATIVE
Candida Vaginitis: NEGATIVE
Chlamydia: NEGATIVE
Comment: NEGATIVE
Comment: NEGATIVE
Comment: NEGATIVE
Comment: NEGATIVE
Comment: NEGATIVE
Comment: NORMAL
Neisseria Gonorrhea: NEGATIVE
Trichomonas: NEGATIVE

## 2021-01-29 ENCOUNTER — Ambulatory Visit: Payer: No Typology Code available for payment source | Admitting: Family Medicine

## 2021-02-03 ENCOUNTER — Ambulatory Visit: Payer: No Typology Code available for payment source | Admitting: Family

## 2021-02-03 NOTE — Progress Notes (Signed)
[facility] [address], [address] [place], [place] [address] [phone]  Date:  02/09/2021   Name:  Maria Hernandez   DOB:  [birth year]   MRN:  [id number]  PCP:  Sandford Craze, NP    Chief Complaint: Dyspareunia (For the last month. '//Dropped bladder?)   History of Present Illness:  Maria Hernandez is a 45 y.o. very pleasant female patient who presents with the following:  Pt seen today with concern about her bladder  Last visit with myself in March  History of migraine headache  She has started having pain during or after sex over the last month- no changes as far as practices, same partner etc. -she can think of no particular explanation for this pain She went to the ER on 7/22 and had eval, all was ok - negative STI screening and urine culture negative  More recently she has intercourse again- she had discomfort after but not during  A day or so after intercourse the pain will resolve  No diarrhea or stomach pain No unusual vaginal discharge or itching She does not have dysuria- this has not occurred at all during this illness No major change in her urinary habits - she does tend to urinate frequently, but this is typical for her No hematuria   LMP was 7/14- not concerned about pregnancy  She has noted reduced spotting prior to her menstrual bleeding the last 2 months, otherwise no change Never had a baby  Her mother had her bladder "tacked" for a "dropped bladder"- patient wonders if this could be a concern for her Pap done in March- normal High risk HPV Negative   Adequacy Satisfactory for evaluation; transformation zone component PRESENT.   Diagnosis - Negative for intraepithelial lesion or malignancy (NILM)   Comment Normal Reference Range HPV - Negative     Covid series Patient Active Problem List   Diagnosis Date Noted   Knee pain, bilateral 07/16/2019   Chronic right-sided low back pain without  sciatica 05/09/2019   Migraines 06/07/2017   Hearing loss 06/07/2017    Past Medical History:  Diagnosis Date   Migraines     Past Surgical History:  Procedure Laterality Date   AUGMENTATION MAMMAPLASTY Bilateral    BREAST ENHANCEMENT SURGERY Bilateral 2005   INNER EAR SURGERY Left    Reports 2 prior ear surgeries for prosthesis. 3rd surgery coming up in January 2019   MANDIBLE OSTEOTOMY  1995    Social History   Tobacco Use   Smoking status: Never   Smokeless tobacco: Never  Vaping Use   Vaping Use: Never used  Substance Use Topics   Alcohol use: Yes    Comment: occasional wine "1- 2 per month"   Drug use: Never    Family History  Problem Relation Age of Onset   Hypothyroidism Mother    Hypertension Father    Diabetes type II Father        BKA   Breast cancer Maternal Grandmother     Allergies  Allergen Reactions   Verapamil Hives   Macrobid [Nitrofurantoin Macrocrystal]     Light headed   Metronidazole Other (See Comments)    Medication list has been reviewed and updated.  Current Outpatient Medications on File Prior to Visit  Medication Sig Dispense Refill   diclofenac (VOLTAREN) 75 MG EC tablet Take 1 tablet (75 mg total) by mouth 2 (two) times daily. 60 tablet 1  Multiple Vitamin (MULTIVITAMIN) tablet Take 1 tablet by mouth daily.     SUMAtriptan (IMITREX) 50 MG tablet TAKE 1 TABLET BY MOUTH ONCE DAILY AS NEEDED FOR HEADACHE. MAY REPEAT IN 2 HOURS AND TAKE 1 TABLET AS NEEDED. 10 tablet 0   No current facility-administered medications on file prior to visit.    Review of Systems:  As per HPI- otherwise negative.   Physical Examination: Vitals:   02/09/21 1603  BP: 120/76  Pulse: 65  Temp: 98.1 F (36.7 C)  SpO2: 99%   Vitals:   02/09/21 1603  Weight: 148 lb (67.1 kg)  Height: 5\' 7"  (1.702 m)   Body mass index is 23.18 kg/m. Ideal Body Weight: Weight in (lb) to have BMI = 25: 159.3  GEN: no acute distress.  Normal weight, looks  well HEENT: Atraumatic, Normocephalic.  Ears and Nose: No external deformity. CV: RRR, No M/G/R. No JVD. No thrill. No extra heart sounds. PULM: CTA B, no wheezes, crackles, rhonchi. No retractions. No resp. distress. No accessory muscle use. ABD: S, NT, ND, +BS. No rebound. No HSM.  Benign belly EXTR: No c/c/e PSYCH: Normally interactive. Conversant.  Breast: normal exam, no masses/ dimpling/ discharge Pelvic: normal, no vaginal lesions or discharge. Uterus normal, no CMT, no adnexal tendereness or masses She does note tenderness when I press directly over her bladder on bimanual exam.  She is not tender at the pubic symphysis  Results for orders placed or performed in visit on 02/09/21  POCT urine pregnancy  Result Value Ref Range   Preg Test, Ur Negative Negative      Assessment and Plan: Pelvic pain - Plan: 04/11/21 Pelvic Complete With Transvaginal  Urinary frequency - Plan: Urine Culture, POCT urine pregnancy Patient seen today with concern of pelvic pain during or after intercourse for about 1 month.  She has been evaluated for STI and UTI, all negative I will repeat a urine culture today to make sure we did not miss a urinary tract infection.  No evidence of PID, no cervical motion tenderness Also consider possible uterine tenderness, will order a pelvic ultrasound Will be in touch with patient pending results, she is asked to contact me right away if any change or worsening This visit occurred during the SARS-CoV-2 public health emergency.  Safety protocols were in place, including screening questions prior to the visit, additional usage of staff PPE, and extensive cleaning of exam room while observing appropriate contact time as indicated for disinfecting solutions.   Signed [place], MD

## 2021-02-03 NOTE — Patient Instructions (Addendum)
Good to see you again today!   I will be in touch with your urine culture and also a pelvic ultrasound for you Please let me know if anything is changing or getting worse.

## 2021-02-09 ENCOUNTER — Other Ambulatory Visit: Payer: Self-pay

## 2021-02-09 ENCOUNTER — Ambulatory Visit (INDEPENDENT_AMBULATORY_CARE_PROVIDER_SITE_OTHER): Payer: No Typology Code available for payment source | Admitting: Family Medicine

## 2021-02-09 VITALS — BP 120/76 | HR 65 | Temp 98.1°F | Ht 67.0 in | Wt 148.0 lb

## 2021-02-09 DIAGNOSIS — R102 Pelvic and perineal pain: Secondary | ICD-10-CM | POA: Diagnosis not present

## 2021-02-09 DIAGNOSIS — R350 Frequency of micturition: Secondary | ICD-10-CM | POA: Diagnosis not present

## 2021-02-09 LAB — POCT URINE PREGNANCY: Preg Test, Ur: NEGATIVE

## 2021-02-11 LAB — URINE CULTURE
MICRO NUMBER:: 12219350
Result:: NO GROWTH
SPECIMEN QUALITY:: ADEQUATE

## 2021-02-12 ENCOUNTER — Encounter: Payer: Self-pay | Admitting: Family Medicine

## 2021-02-16 ENCOUNTER — Ambulatory Visit: Payer: No Typology Code available for payment source | Admitting: Family

## 2021-02-16 ENCOUNTER — Ambulatory Visit (HOSPITAL_BASED_OUTPATIENT_CLINIC_OR_DEPARTMENT_OTHER): Payer: No Typology Code available for payment source

## 2021-02-19 ENCOUNTER — Ambulatory Visit (HOSPITAL_BASED_OUTPATIENT_CLINIC_OR_DEPARTMENT_OTHER)
Admission: RE | Admit: 2021-02-19 | Discharge: 2021-02-19 | Disposition: A | Discharge status: Home / Self Care | Payer: No Typology Code available for payment source | Source: Ambulatory Visit | Attending: Family Medicine | Admitting: Family Medicine

## 2021-02-19 ENCOUNTER — Other Ambulatory Visit: Payer: Self-pay

## 2021-02-19 DIAGNOSIS — R102 Pelvic and perineal pain: Secondary | ICD-10-CM | POA: Insufficient documentation

## 2021-02-20 ENCOUNTER — Encounter: Payer: Self-pay | Admitting: Family Medicine

## 2021-02-23 ENCOUNTER — Other Ambulatory Visit: Payer: Self-pay | Admitting: Family

## 2021-04-13 ENCOUNTER — Other Ambulatory Visit: Payer: Self-pay | Admitting: Family

## 2021-04-29 ENCOUNTER — Encounter: Payer: Self-pay | Admitting: Family

## 2021-04-30 ENCOUNTER — Ambulatory Visit
Admission: RE | Admit: 2021-04-30 | Discharge: 2021-04-30 | Disposition: A | Discharge status: Home / Self Care | Payer: No Typology Code available for payment source | Source: Ambulatory Visit | Attending: Emergency Medicine | Admitting: Emergency Medicine

## 2021-04-30 ENCOUNTER — Other Ambulatory Visit: Payer: Self-pay

## 2021-04-30 VITALS — BP 112/83 | HR 63 | Temp 97.7°F | Resp 16

## 2021-04-30 DIAGNOSIS — J029 Acute pharyngitis, unspecified: Secondary | ICD-10-CM | POA: Diagnosis not present

## 2021-04-30 LAB — POCT RAPID STREP A (OFFICE): Rapid Strep A Screen: NEGATIVE

## 2021-04-30 NOTE — ED Triage Notes (Signed)
Pt reports since Monday she has had soreness and swelling to tonsil area.

## 2021-04-30 NOTE — ED Provider Notes (Signed)
[facility]    CSN: [id number] Arrival date & time: 04/30/21  1220      History   Chief Complaint Chief Complaint  Patient presents with   Sore Throat    HPI Maria Hernandez is a 45 y.o. female.   Pt reports since Monday she has had soreness and swelling to tonsil area.  The history is provided by the patient.   Past Medical History:  Diagnosis Date   Migraines     Patient Active Problem List   Diagnosis Date Noted   Knee pain, bilateral 07/16/2019   Chronic right-sided low back pain without sciatica 05/09/2019   Migraines 06/07/2017   Hearing loss 06/07/2017    Past Surgical History:  Procedure Laterality Date   AUGMENTATION MAMMAPLASTY Bilateral    BREAST ENHANCEMENT SURGERY Bilateral 2005   INNER EAR SURGERY Left    Reports 2 prior ear surgeries for prosthesis. 3rd surgery coming up in January 2019   MANDIBLE OSTEOTOMY  1995    OB History   No obstetric history on file.      Home Medications    Prior to Admission medications   Medication Sig Start Date End Date Taking? Authorizing Provider  Multiple Vitamin (MULTIVITAMIN) tablet Take 1 tablet by mouth daily.    [provider]  SUMAtriptan (IMITREX) 50 MG tablet TAKE 1 TABLET BY MOUTH ONCE DAILY AS NEEDED FOR HEADACHE. MAY REPEAT IN 2 HOURS. 04/14/21   Sandford Craze, NP    Family History Family History  Problem Relation Age of Onset   Hypothyroidism Mother    Hypertension Father    Diabetes type II Father        BKA   Breast cancer Maternal Grandmother     Social History Social History   Tobacco Use   Smoking status: Never   Smokeless tobacco: Never  Vaping Use   Vaping Use: Never used  Substance Use Topics   Alcohol use: Yes    Comment: occasional wine "1- 2 per month"   Drug use: Never     Allergies   Verapamil, Macrobid [nitrofurantoin macrocrystal], and Metronidazole   Review of Systems Review of Systems Pertinent findings noted in history of  present illness.    Physical Exam Triage Vital Signs ED Triage Vitals  Enc Vitals Group     BP      Pulse      Resp      Temp      Temp src      SpO2      Weight      Height      Head Circumference      Peak Flow      Pain Score      Pain Loc      Pain Edu?      Excl. in GC?    No data found.  Updated Vital Signs BP 112/83 (BP Location: Left Arm)   Pulse 63   Temp 97.7 F (36.5 C) (Oral)   Resp 16   LMP 04/28/2021   SpO2 98%   Visual Acuity Right Eye Distance:   Left Eye Distance:   Bilateral Distance:    Right Eye Near:   Left Eye Near:    Bilateral Near:     Physical Exam Vitals and nursing note reviewed.  Constitutional:      General: She is not in acute distress.    Appearance: Normal appearance. She is not ill-appearing.  HENT:  Head: Normocephalic and atraumatic.     Salivary Glands: Right salivary gland is not diffusely enlarged or tender. Left salivary gland is not diffusely enlarged or tender.     Right Ear: Tympanic membrane, ear canal and external ear normal. No drainage. No middle ear effusion. There is no impacted cerumen. Tympanic membrane is not erythematous or bulging.     Left Ear: Tympanic membrane, ear canal and external ear normal. No drainage.  No middle ear effusion. There is no impacted cerumen. Tympanic membrane is not erythematous or bulging.     Nose: Nose normal. No nasal deformity, septal deviation, mucosal edema, congestion or rhinorrhea.     Right Turbinates: Not enlarged, swollen or pale.     Left Turbinates: Not enlarged, swollen or pale.     Right Sinus: No maxillary sinus tenderness or frontal sinus tenderness.     Left Sinus: No maxillary sinus tenderness or frontal sinus tenderness.     Mouth/Throat:     Lips: Pink. No lesions.     Mouth: Mucous membranes are moist. No oral lesions.     Pharynx: Oropharynx is clear. Uvula midline. No posterior oropharyngeal erythema or uvula swelling.     Tonsils: No tonsillar  exudate. 0 on the right. 0 on the left.  Eyes:     General: Lids are normal.        Right eye: No discharge.        Left eye: No discharge.     Extraocular Movements: Extraocular movements intact.     Conjunctiva/sclera: Conjunctivae normal.     Right eye: Right conjunctiva is not injected.     Left eye: Left conjunctiva is not injected.  Neck:     Trachea: Trachea and phonation normal.  Cardiovascular:     Rate and Rhythm: Normal rate and regular rhythm.     Pulses: Normal pulses.     Heart sounds: Normal heart sounds. No murmur heard.   No friction rub. No gallop.  Pulmonary:     Effort: Pulmonary effort is normal. No accessory muscle usage, prolonged expiration or respiratory distress.     Breath sounds: Normal breath sounds. No stridor, decreased air movement or transmitted upper airway sounds. No decreased breath sounds, wheezing, rhonchi or rales.  Chest:     Chest wall: No tenderness.  Musculoskeletal:        General: Normal range of motion.     Cervical back: Normal range of motion and neck supple. Normal range of motion.  Lymphadenopathy:     Cervical: No cervical adenopathy.  Skin:    General: Skin is warm and dry.     Findings: No erythema or rash.  Neurological:     General: No focal deficit present.     Mental Status: She is alert and oriented to person, place, and time.  Psychiatric:        Mood and Affect: Mood normal.        Behavior: Behavior normal.     UC Treatments / Results  Labs (all labs ordered are listed, but only abnormal results are displayed) Labs Reviewed  CULTURE, GROUP A STREP [facility])  POCT RAPID STREP A (OFFICE)    EKG   Radiology No results found.  Procedures Procedures (including critical care time)  Medications Ordered in UC Medications - No data to display  Initial Impression / Assessment and Plan / UC Course  I have reviewed the triage vital signs and the nursing notes.  Pertinent labs & imaging results  that were  available during my care of the patient were reviewed by me and considered in my medical decision making (see chart for details).     Pharyngitis, likely viral.  Rapid strep test today is negative, physical exam findings are unremarkable.  Conservative care recommended.  Throat culture will be sent per protocol, patient advised that antibiotics will be prescribed if indicated based on throat culture results.  Return precautions provided.  Patient verbalized understanding and agreement of plan as discussed.  All questions were addressed during visit.  Please see discharge instructions below for further details of plan.  Final Clinical Impressions(s) / UC Diagnoses   Final diagnoses:  Acute pharyngitis, unspecified etiology     Discharge Instructions      The results of your rapid strep test today are negative.  Throat culture will be sent for definitive testing.  These usually takes 3 to 5 days.  In the meantime, if your throat culture is positive for streptococcal pharyngitis, you will be provided an antibiotic for treatment.  In the meantime, please return to the urgent care if you have any worsening sore throat or begin to experience any other associated symptoms for reevaluation.  For sore throat symptoms, you are welcome to use throat lozenges, tea with honey, gargle salt water or take 4 to 600 mg of ibuprofen.     ED Prescriptions   None    PDMP not reviewed this encounter.   Theadora Rama Scales, PA-C 04/30/21 1400

## 2021-04-30 NOTE — Discharge Instructions (Addendum)
The results of your rapid strep test today are negative.  Throat culture will be sent for definitive testing.  These usually takes 3 to 5 days.  In the meantime, if your throat culture is positive for streptococcal pharyngitis, you will be provided an antibiotic for treatment.  In the meantime, please return to the urgent care if you have any worsening sore throat or begin to experience any other associated symptoms for reevaluation.  For sore throat symptoms, you are welcome to use throat lozenges, tea with honey, gargle salt water or take 4 to 600 mg of ibuprofen.

## 2021-05-02 LAB — CULTURE, GROUP A STREP (THRC)

## 2021-05-03 ENCOUNTER — Encounter: Payer: Self-pay | Admitting: Family

## 2021-05-03 DIAGNOSIS — R0989 Other specified symptoms and signs involving the circulatory and respiratory systems: Secondary | ICD-10-CM

## 2021-05-04 ENCOUNTER — Encounter: Payer: Self-pay | Admitting: Family

## 2021-05-12 ENCOUNTER — Encounter: Payer: Self-pay | Admitting: Family Medicine

## 2021-05-12 ENCOUNTER — Encounter: Payer: Self-pay | Admitting: Family

## 2021-05-12 MED ORDER — FLUCONAZOLE 150 MG PO TABS: ORAL_TABLET | ORAL | 0 refills | Status: DC

## 2021-05-12 NOTE — Telephone Encounter (Signed)
Prescription sent in by PCP

## 2021-05-17 ENCOUNTER — Ambulatory Visit: Payer: No Typology Code available for payment source

## 2021-05-19 NOTE — Progress Notes (Signed)
[facility] [address], [address] [place], [place] [address] [phone]  Date:  05/21/2021   Name:  Maria Hernandez   DOB:  08-04-1975   MRN:  [id number]  PCP:  Sandford Craze, NP    Chief Complaint: possible yeast infection (Seen Maria Hernandez last week for UTI, pt thinks her sxs are either a yeast infection or the UTI has not cleared. )   History of Present Illness:  Maria Hernandez is a 45 y.o. very pleasant female patient who presents with the following:  Primary patient of Maria Hernandez, although I have seen her myself several times Generally good health, history of migraine headaches Here today with concern of possible yeast vaginitis  Most recent Pap in March of this year-normal, negative HPV COVID-19 updated booster- done at [facility] Flu vaccine- done at [facility]  She had sx of yeast vaginitis last week- Melissa called in Diflucan and she used 2 doses She noted improvement but not resolution of her sx She then used monistat cream- she last used it a couple of days ago.  She used both a 1 day treatment and then followed up with a multi day treatment.  She wonders if this may have caused more irritation.  Most recent use of Monistat about 2 days ago  She is just starting to have some spotting with the beginning of her period  Right now she notes sx of irritation with urination- she feels like it could be getting a UTI, or could be from vaginal irritation  No new sexual partners or concern about STI Patient Active Problem List   Diagnosis Date Noted   Knee pain, bilateral 07/16/2019   Chronic right-sided low back pain without sciatica 05/09/2019   Migraines 06/07/2017   Hearing loss 06/07/2017    Past Medical History:  Diagnosis Date   Migraines     Past Surgical History:  Procedure Laterality Date   AUGMENTATION MAMMAPLASTY Bilateral    BREAST ENHANCEMENT SURGERY Bilateral 2005   INNER EAR SURGERY  Left    Reports 2 prior ear surgeries for prosthesis. 3rd surgery coming up in January 2019   MANDIBLE OSTEOTOMY  1995    Social History   Tobacco Use   Smoking status: Never   Smokeless tobacco: Never  Vaping Use   Vaping Use: Never used  Substance Use Topics   Alcohol use: Yes    Comment: occasional wine "1- 2 per month"   Drug use: Never    Family History  Problem Relation Age of Onset   Hypothyroidism Mother    Hypertension Father    Diabetes type II Father        BKA   Breast cancer Maternal Grandmother     Allergies  Allergen Reactions   Verapamil Hives   Macrobid [Nitrofurantoin Macrocrystal]     Light headed   Metronidazole Other (See Comments)    Lightheadedness    Medication list has been reviewed and updated.  Current Outpatient Medications on File Prior to Visit  Medication Sig Dispense Refill   Multiple Vitamin (MULTIVITAMIN) tablet Take 1 tablet by mouth daily.     SUMAtriptan (IMITREX) 50 MG tablet TAKE 1 TABLET BY MOUTH ONCE DAILY AS NEEDED FOR HEADACHE. MAY REPEAT IN 2 HOURS. 10 tablet 0   No current facility-administered medications on file prior to visit.    Review of Systems:  As per HPI- otherwise negative.   Physical Examination: Vitals:  05/21/21 1110  BP: 100/60  Pulse: 78  Resp: 18  Temp: 98.3 F (36.8 C)  SpO2: 99%   Vitals:   05/21/21 1110  Weight: 150 lb 12.8 oz (68.4 kg)  Height: 5\' 7"  (1.702 m)   Body mass index is 23.62 kg/m. Ideal Body Weight: Weight in (lb) to have BMI = 25: 159.3  GEN: no acute distress.  Normal weight, looks well HEENT: Atraumatic, Normocephalic.  Ears and Nose: No external deformity. CV: RRR, No M/G/R. No JVD. No thrill. No extra heart sounds. PULM: CTA B, no wheezes, crackles, rhonchi. No retractions. No resp. distress. No accessory muscle use. ABD: S, NT, ND, +BS. No rebound. No HSM. EXTR: No c/c/e PSYCH: Normally interactive. Conversant.  Vulva appears normal, vaginal canal is normal  with some white discharge-could also be left over Monistat cream.  Beginnings of menstrual bleeding are noted at cervix  Assessment and Plan: Lower urinary tract symptoms (LUTS) - Plan: POCT Urinalysis Dipstick, Urine Culture  Vaginal irritation - Plan: Cervicovaginal ancillary only( [place]), fluconazole (DIFLUCAN) 150 MG tablet  Patient seen today with concern of vaginal irritation versus possible UTI  Urine culture and Gen-Probe are pending  In the meantime we will have her use Diflucan again-she will let me know if getting worse  Signed , MD

## 2021-05-21 ENCOUNTER — Ambulatory Visit (INDEPENDENT_AMBULATORY_CARE_PROVIDER_SITE_OTHER): Payer: No Typology Code available for payment source | Admitting: Family Medicine

## 2021-05-21 ENCOUNTER — Other Ambulatory Visit: Payer: Self-pay

## 2021-05-21 ENCOUNTER — Other Ambulatory Visit (HOSPITAL_COMMUNITY)
Admission: RE | Admit: 2021-05-21 | Discharge: 2021-05-21 | Disposition: A | Discharge status: Home / Self Care | Payer: No Typology Code available for payment source | Source: Ambulatory Visit | Attending: Family | Admitting: Family

## 2021-05-21 VITALS — BP 100/60 | HR 78 | Temp 98.3°F | Resp 18 | Ht 67.0 in | Wt 150.8 lb

## 2021-05-21 DIAGNOSIS — N898 Other specified noninflammatory disorders of vagina: Secondary | ICD-10-CM

## 2021-05-21 DIAGNOSIS — R399 Unspecified symptoms and signs involving the genitourinary system: Secondary | ICD-10-CM

## 2021-05-21 LAB — POCT URINALYSIS DIPSTICK
Bilirubin, UA: NEGATIVE
Blood, UA: NEGATIVE
Glucose, UA: NEGATIVE
Ketones, UA: NEGATIVE
Nitrite, UA: NEGATIVE
Protein, UA: NEGATIVE
Spec Grav, UA: 1.02 (ref 1.010–1.025)
Urobilinogen, UA: 0.2 E.U./dL
pH, UA: 6 (ref 5.0–8.0)

## 2021-05-21 MED ORDER — FLUCONAZOLE 150 MG PO TABS: 150.0000 mg | ORAL_TABLET | Freq: Once | ORAL | 0 refills | Status: AC

## 2021-05-21 NOTE — Patient Instructions (Signed)
Good to see you today- I will be in touch with your swab and urine culture asap In the meantime ok to use diflucan if you like!    JC

## 2021-05-22 ENCOUNTER — Encounter: Payer: Self-pay | Admitting: Family Medicine

## 2021-05-22 LAB — URINE CULTURE
MICRO NUMBER:: 12651478
SPECIMEN QUALITY:: ADEQUATE

## 2021-05-23 MED ORDER — TERCONAZOLE 0.4 % VA CREA: 1.0000 | TOPICAL_CREAM | Freq: Every day | VAGINAL | 0 refills | Status: DC

## 2021-05-25 ENCOUNTER — Encounter: Payer: Self-pay | Admitting: Family Medicine

## 2021-05-25 LAB — CERVICOVAGINAL ANCILLARY ONLY
Bacterial Vaginitis (gardnerella): NEGATIVE
Candida Glabrata: NEGATIVE
Candida Vaginitis: NEGATIVE
Comment: NEGATIVE
Comment: NEGATIVE
Comment: NEGATIVE

## 2021-06-12 ENCOUNTER — Other Ambulatory Visit: Payer: Self-pay | Admitting: Family

## 2021-06-12 ENCOUNTER — Encounter: Payer: Self-pay | Admitting: Family Medicine

## 2021-06-12 ENCOUNTER — Other Ambulatory Visit: Payer: Self-pay | Admitting: Family Medicine

## 2021-06-12 MED ORDER — SUMATRIPTAN SUCCINATE 50 MG PO TABS: ORAL_TABLET | ORAL | 3 refills | Status: DC

## 2021-08-31 ENCOUNTER — Encounter: Payer: Self-pay | Admitting: Family Medicine

## 2021-09-04 ENCOUNTER — Telehealth: Payer: No Typology Code available for payment source | Admitting: Physician Assistant

## 2021-09-04 ENCOUNTER — Encounter: Payer: Self-pay | Admitting: Family

## 2021-09-04 ENCOUNTER — Encounter: Payer: Self-pay | Admitting: Family Medicine

## 2021-09-04 DIAGNOSIS — L03011 Cellulitis of right finger: Secondary | ICD-10-CM

## 2021-09-04 MED ORDER — CEPHALEXIN 500 MG PO CAPS: 500.0000 mg | ORAL_CAPSULE | Freq: Two times a day (BID) | ORAL | 0 refills | Status: AC

## 2021-09-04 MED ORDER — MUPIROCIN CALCIUM 2 % EX CREA: 1.0000 "application " | TOPICAL_CREAM | Freq: Two times a day (BID) | CUTANEOUS | 0 refills | Status: DC

## 2021-09-04 MED ORDER — FLUCONAZOLE 150 MG PO TABS: 150.0000 mg | ORAL_TABLET | Freq: Once | ORAL | 0 refills | Status: AC

## 2021-09-04 NOTE — Patient Instructions (Signed)
?Laretta Bolster, thank you for joining Piedad Climes, PA-C for today's virtual visit.  While this provider is not your primary care provider (PCP), if your PCP is located in our provider database this encounter information will be shared with them immediately following your visit. ? ?Consent: ?(Patient) Maria Hernandez provided verbal consent for this virtual visit at the beginning of the encounter. ? ?Current Medications: ? ?Current Outpatient Medications:  ?  cephALEXin (KEFLEX) 500 MG capsule, Take 1 capsule (500 mg total) by mouth 2 (two) times daily for 7 days., Disp: 14 capsule, Rfl: 0 ?  mupirocin cream (BACTROBAN) 2 %, Apply 1 application topically 2 (two) times daily., Disp: 15 g, Rfl: 0 ?  SUMAtriptan (IMITREX) 50 MG tablet, May repeat in 2 hours if headache persists or recurs., Disp: 10 tablet, Rfl: 3 ?  Multiple Vitamin (MULTIVITAMIN) tablet, Take 1 tablet by mouth daily., Disp: , Rfl:   ? ?Medications ordered in this encounter:  ?Meds ordered this encounter  ?Medications  ? mupirocin cream (BACTROBAN) 2 %  ?  Sig: Apply 1 application topically 2 (two) times daily.  ?  Dispense:  15 g  ?  Refill:  0  ?  Order Specific Question:   Supervising Provider  ?  Answer:   Eber Hong [[id number]]  ? cephALEXin (KEFLEX) 500 MG capsule  ?  Sig: Take 1 capsule (500 mg total) by mouth 2 (two) times daily for 7 days.  ?  Dispense:  14 capsule  ?  Refill:  0  ?  Order Specific Question:   Supervising Provider  ?  Answer:   Eber Hong [[id number]]  ?  ? ?*If you need refills on other medications prior to your next appointment, please contact your pharmacy* ? ?Follow-Up: ?Call back or seek an in-person evaluation if the symptoms worsen or if the condition fails to improve as anticipated. ? ?Other Instructions ?Continue to keep the area clean and dry. ?You can continue your soaks 1-2 x daily. Pat completely dry and apply the Bactroban. ?If not easing up in next 48 hours or any worsening symptoms -- start the oral  antibiotic (Keflex) as directed.  ? ?Paronychia ?Paronychia is an infection of the skin. It happens near a fingernail or toenail. It may cause pain and swelling around the nail. In some cases, a fluid-filled bump (abscess) can form near or under the nail. ?Often, this condition is not serious, and it clears up with treatment. ?What are the causes? ?This condition may be caused by a germ. The germ may be bacteria or a fungus. These germs can enter the body through an opening in the skin, such as a cut or a hangnail. Other causes include: ?Repeated injuries to your fingernails or toenails. ?Irritation of the base and sides of the nail (cuticle). ?What increases the risk? ?This condition is more likely to develop in people who: ?Get their hands wet often, such as a dishwasher. ?Bite their fingernails or the base and sides of their nails. ?Have other skin problems. ?Have hangnails or hurt fingertips. ?Come into contact with chemicals like detergents. ?Have diabetes. ?What are the signs or symptoms? ?Redness and swelling of the skin near the nail. ?A tender feeling around the nail. ?Pus-filled bumps under the skin at the base and sides of the nail. ?Fluid or pus under the nail. ?Pain in the area. ?How is this treated? ?Treatment depends on the cause of your condition and how bad it is. If your condition is mild,  it may clear up on its own in a few days or after soaking in warm water. If needed, treatment may include: ?Antibiotic medicine. ?Antifungal medicine. ?A procedure to drain pus from a fluid-filled bump. ?Medicine to treat irritation and swelling (corticosteroids). ?Taking off part of an ingrown toenail. ?A bandage (dressing) may be placed over the nail area. ?Follow these instructions at home: ?Wound care ?Keep the affected area clean. ?Soak the fingers or toes in warm water as told by your doctor. You may be told to do this for 20 minutes, 2-3 times a day. ?Keep the area dry when you are not soaking it. ?Do not  try to drain a fluid-filled bump on your own. ?Follow instructions from your doctor about how to take care of the affected area. Make sure you: ?Wash your hands with soap and water for at least 20 seconds before and after you change your bandage. If you cannot use soap and water, use hand sanitizer. ?Change your bandage as told by your doctor. ?If you had a fluid-filled bump and your doctor drained it, check the area every day for signs of infection. Check for: ?Redness, swelling, or pain. ?Fluid or blood. ?Warmth. ?Pus or a bad smell. ?Medicines ? ?Take over-the-counter and prescription medicines only as told by your doctor. ?If you were prescribed an antibiotic medicine, take it as told by your doctor. Do not stop taking it even if you start to feel better. ?General instructions ?Avoid contact with anything that irritates your skin or that you are allergic to. ?Do not pick at the affected area. ?Keep all follow-up visits. ?Prevention ?To prevent this condition from happening again: ?Wear rubber gloves when putting your hands in water for washing dishes or other tasks. ?Wear gloves if your hands might touch cleaners or chemicals. ?Avoid injuring your nails or fingertips. ?Do not bite your nails or tear hangnails. ?Do not cut your nails very short. ?Do not cut the skin at the base and sides of the nail. ?Use clean nail clippers or scissors when trimming nails. ?Contact a doctor if: ?You feel worse. ?You do not get better. ?You keep having or you have more fluid, blood, or pus coming from the affected area. ?Your affected finger, toe, or joint gets swollen or hard to move. ?You have a fever or chills. ?There is redness spreading from the affected area. ?Summary ?Paronychia is an infection of the skin. It happens near a fingernail or toenail. ?This condition may cause pain and swelling around the nail. ?Soak the fingers or toes in warm water as told by your doctor. ?Often, this condition is not serious, and it clears  up with treatment. ?This information is not intended to replace advice given to you by your health care provider. Make sure you discuss any questions you have with your health care provider. ?Document Revised: 09/22/2020 Document Reviewed: 09/22/2020 ?Elsevier Patient Education ? 2022 Elsevier Inc. ? ? ? ?If you have been instructed to have an in-person evaluation today at a local Urgent Care facility, please use the link below. It will take you to a list of all of our available [facility], including address, phone number and hours of operation. Please do not delay care.  ?[facility] ? ?If you or a family member do not have a primary care provider, use the link below to schedule a visit and establish care. When you choose a [facility] primary care physician or advanced practice provider, you gain a long-term partner  in health. ?Find a Primary Care Provider ? ?Learn more about [facility]'s in-office and virtual care options: ?[facility] Get Care Now  ?

## 2021-09-04 NOTE — Progress Notes (Signed)
?Virtual Visit Consent  ? ?Laretta Bolster, you are scheduled for a virtual visit with a [facility] provider today.   ?  ?Just as with appointments in the office, your consent must be obtained to participate.  Your consent will be active for this visit and any virtual visit you may have with one of our providers in the next 365 days.   ?  ?If you have a MyChart account, a copy of this consent can be sent to you electronically.  All virtual visits are billed to your insurance company just like a traditional visit in the office.   ? ?As this is a virtual visit, video technology does not allow for your provider to perform a traditional examination.  This may limit your provider's ability to fully assess your condition.  If your provider identifies any concerns that need to be evaluated in person or the need to arrange testing (such as labs, EKG, etc.), we will make arrangements to do so.   ?  ?Although advances in technology are sophisticated, we cannot ensure that it will always work on either your end or our end.  If the connection with a video visit is poor, the visit may have to be switched to a telephone visit.  With either a video or telephone visit, we are not always able to ensure that we have a secure connection.    ? ?I need to obtain your verbal consent now.   Are you willing to proceed with your visit today?  ?  ?Maria Hernandez has provided verbal consent on 09/04/2021 for a virtual visit (video or telephone). ?  ?Piedad Climes, PA-C  ? ?Date: 09/04/2021 11:42 AM ? ? ?Virtual Visit via Video Note  ? ?IPiedad Climes, connected with  Maria Hernandez  ([id number], 16-Apr-1976) on 09/04/21 at 11:15 AM EST by a video-enabled telemedicine application and verified that I am speaking with the correct person using two identifiers. ? ?Location: ?Patient: Virtual Visit Location Patient: Home ?Provider: Virtual Visit Location Provider: Home Office ?  ?I discussed the limitations of evaluation and management  by telemedicine and the availability of in person appointments. The patient expressed understanding and agreed to proceed.   ? ?History of Present Illness: ?Maria Hernandez is a 46 y.o. who identifies as a female who was assigned female at birth, and is being seen today for possible infection around nail. Notes having her nails done over the weekend and is now noting some pain, tenderness and redness at the base of nail on her R middle finger. Notes tenderness has worsened over the past 24 hours. Som noted drainage when pulling the skin back. Has been doing peroxide soaks at home. Denies fever, chills, malaise or fatigue.  ? ?HPI: HPI  ?Problems:  ?Patient Active Problem List  ? Diagnosis Date Noted  ? Knee pain, bilateral 07/16/2019  ? Chronic right-sided low back pain without sciatica 05/09/2019  ? Migraines 06/07/2017  ? Hearing loss 06/07/2017  ?  ?Allergies:  ?Allergies  ?Allergen Reactions  ? Verapamil Hives  ? Macrobid [Nitrofurantoin Macrocrystal]   ?  Light headed  ? Metronidazole Other (See Comments)  ?  Lightheadedness  ? ?Medications:  ?Current Outpatient Medications:  ?  cephALEXin (KEFLEX) 500 MG capsule, Take 1 capsule (500 mg total) by mouth 2 (two) times daily for 7 days., Disp: 14 capsule, Rfl: 0 ?  mupirocin cream (BACTROBAN) 2 %, Apply 1 application topically 2 (two) times daily., Disp: 15 g,  Rfl: 0 ?  SUMAtriptan (IMITREX) 50 MG tablet, May repeat in 2 hours if headache persists or recurs., Disp: 10 tablet, Rfl: 3 ?  Multiple Vitamin (MULTIVITAMIN) tablet, Take 1 tablet by mouth daily., Disp: , Rfl:  ? ?Observations/Objective: ?Patient is well-developed, well-nourished in no acute distress.  ?Resting comfortably at home.  ?Head is normocephalic, atraumatic.  ?No labored breathing. ?Speech is clear and coherent with logical content.  ?Patient is alert and oriented at baseline.  ?Medial redness and swelling at base of R third fingernail. No visible drainage. Is able to move finger normally.   ? ?Assessment and Plan: ?1. Paronychia of finger, right ?- mupirocin cream (BACTROBAN) 2 %; Apply 1 application topically 2 (two) times daily.  Dispense: 15 g; Refill: 0 ?- cephALEXin (KEFLEX) 500 MG capsule; Take 1 capsule (500 mg total) by mouth 2 (two) times daily for 7 days.  Dispense: 14 capsule; Refill: 0 ? ?Will have her continue peroxide soaks, adding on the Bactroban topically twice daily. If not turning the corner in next 48 hours or anything worsening, she is to take the Keflex as directed. Diflucan placed on file giving history of antibiotic-induced yeast infection. Supportive measures reviewed. Strict in-person precautions discussed.  ? ?Follow Up Instructions: ?I discussed the assessment and treatment plan with the patient. The patient was provided an opportunity to ask questions and all were answered. The patient agreed with the plan and demonstrated an understanding of the instructions.  A copy of instructions were sent to the patient via MyChart unless otherwise noted below.  ? ?The patient was advised to call back or seek an in-person evaluation if the symptoms worsen or if the condition fails to improve as anticipated. ? ?Time:  ?I spent 10 minutes with the patient via telehealth technology discussing the above problems/concerns.   ? ?Piedad Climes, PA-C ?

## 2021-09-08 ENCOUNTER — Ambulatory Visit: Payer: No Typology Code available for payment source | Admitting: Family

## 2021-09-18 ENCOUNTER — Encounter: Payer: Self-pay | Admitting: Family

## 2021-11-21 ENCOUNTER — Encounter (INDEPENDENT_AMBULATORY_CARE_PROVIDER_SITE_OTHER): Payer: No Typology Code available for payment source | Admitting: Family

## 2021-11-21 DIAGNOSIS — G43829 Menstrual migraine, not intractable, without status migrainosus: Secondary | ICD-10-CM | POA: Diagnosis not present

## 2021-11-23 MED ORDER — SUMATRIPTAN SUCCINATE 100 MG PO TABS: ORAL_TABLET | ORAL | 5 refills | Status: DC

## 2021-11-23 NOTE — Telephone Encounter (Signed)

## 2022-03-29 ENCOUNTER — Encounter: Payer: Self-pay | Admitting: Family

## 2022-03-30 ENCOUNTER — Encounter: Payer: Self-pay | Admitting: Family

## 2022-03-30 ENCOUNTER — Ambulatory Visit (INDEPENDENT_AMBULATORY_CARE_PROVIDER_SITE_OTHER): Payer: No Typology Code available for payment source | Admitting: Family

## 2022-03-30 ENCOUNTER — Other Ambulatory Visit (HOSPITAL_COMMUNITY)
Admission: RE | Admit: 2022-03-30 | Discharge: 2022-03-30 | Disposition: A | Discharge status: Home / Self Care | Payer: No Typology Code available for payment source | Source: Ambulatory Visit | Attending: Family | Admitting: Family

## 2022-03-30 VITALS — BP 114/88 | HR 66 | Temp 98.7°F | Resp 16 | Wt 157.0 lb

## 2022-03-30 DIAGNOSIS — G8929 Other chronic pain: Secondary | ICD-10-CM

## 2022-03-30 DIAGNOSIS — Z1231 Encounter for screening mammogram for malignant neoplasm of breast: Secondary | ICD-10-CM

## 2022-03-30 DIAGNOSIS — M545 Low back pain, unspecified: Secondary | ICD-10-CM | POA: Diagnosis not present

## 2022-03-30 DIAGNOSIS — Z23 Encounter for immunization: Secondary | ICD-10-CM

## 2022-03-30 DIAGNOSIS — Z113 Encounter for screening for infections with a predominantly sexual mode of transmission: Secondary | ICD-10-CM | POA: Insufficient documentation

## 2022-03-30 DIAGNOSIS — G43829 Menstrual migraine, not intractable, without status migrainosus: Secondary | ICD-10-CM

## 2022-03-30 DIAGNOSIS — Z0000 Encounter for general adult medical examination without abnormal findings: Secondary | ICD-10-CM | POA: Diagnosis not present

## 2022-03-30 LAB — TSH: TSH: 1.15 u[IU]/mL (ref 0.35–5.50)

## 2022-03-30 NOTE — Assessment & Plan Note (Signed)
Wt Readings from Last 3 Encounters:  03/30/22 157 lb (71.2 kg)  05/21/21 150 lb 12.8 oz (68.4 kg)  02/09/21 148 lb (67.1 kg)   Continue healthy diet, exercise, weight loss efforts. Pap up to date. Flu shot today. Will get covid shot at pharmacy.

## 2022-03-30 NOTE — Assessment & Plan Note (Signed)
Stable with stretching.

## 2022-03-30 NOTE — Patient Instructions (Signed)
Complete lab work prior to leaving.  

## 2022-03-30 NOTE — Assessment & Plan Note (Signed)
Has migraines prior to period. Uses imitrex prn which is helpful.

## 2022-03-30 NOTE — Progress Notes (Signed)
Subjective:   By signing my name below, I, Cassell Clement, attest that this documentation has been prepared under the direction and in the presence of Alma Downs' Suvillivan, NP 03/30/2022.   Patient ID: Maria Hernandez, female    DOB: 11-25-1975, 46 y.o.   MRN: [id number]  Chief Complaint  Patient presents with   STD screening    Will like to be screened for STD, no symptoms.     HPI Patient is in today for an comprehensive physical exam.   Thyroid: She reports no new symptoms, but wants to get thyroid checked because of family history. She reports she has been gaining weight, which might be due to thyroid.  Wt Readings from Last 3 Encounters:  03/30/22 157 lb (71.2 kg)  05/21/21 150 lb 12.8 oz (68.4 kg)  02/09/21 148 lb (67.1 kg)   STD: She wants to receive STD test.  Migraines: She reports her migraines are persistent and often gets migraines when she drinks wine and when she is menstruating.  She reports her migraines are more frequent. She is taking 100 mg Imitrex to help manage migraines.   Back Pain: Her back pain is resolved  Birth Control/Periods: She is not on birth controls. She reports her cycles are normal.  She denies having any fever, hearing or vision symptoms, new muscle pain, joint pain , new moles, rashes, congestion, sinus pain, sore throat, palpations, cough, SOB ,wheezing,n/v/d constipation, blood in stool, dysuria, frequency, hematuria, depression, anxiety, headaches at this time.  Social history: His brother has had hip surgery, has had retinal detachment, he is blind in the left eye, he has hearing aids, and he has arthritis. She reports that she drinks wine occasionally. She does not use drug or tobacco.  Pap Smear: Last completed on 09/08/2020. Immunization: She is interested in receiving the influenza vaccine during today's visit. She has had 2 COVID-19 vaccine and has had a booster.  Mammogram: She needs a mammogram but has not completed one.  Diet: She  reports she has gained weight.  Exercise: She does moderate exercise.  Vision: She is UTD for eye exams.   Health Maintenance Due  Topic Date Due   Hepatitis C Screening  Never done   COVID-19 Vaccine (3 - Pfizer series) 09/17/2019   MAMMOGRAM  11/10/2021    Past Medical History:  Diagnosis Date   Migraines     Past Surgical History:  Procedure Laterality Date   AUGMENTATION MAMMAPLASTY Bilateral    BREAST ENHANCEMENT SURGERY Bilateral 2005   INNER EAR SURGERY Left    Reports 2 prior ear surgeries for prosthesis. 3rd surgery coming up in January 2019   MANDIBLE OSTEOTOMY  1995    Family History  Problem Relation Age of Onset   Hypothyroidism Mother        died from covid   Hypertension Father    Diabetes type II Father        BKA   Arthritis Brother    Retinal detachment Brother    Breast cancer Maternal Grandmother     Social History   Socioeconomic History   Marital status: Single    Spouse name: Not on file   Number of children: Not on file   Years of education: Not on file   Highest education level: Not on file  Occupational History   Not on file  Tobacco Use   Smoking status: Never   Smokeless tobacco: Never  Vaping Use   Vaping Use: Never used  Substance and Sexual Activity   Alcohol use: Yes    Comment: occasional wine "1- 2 per month"   Drug use: Never   Sexual activity: Not Currently    Birth control/protection: None, Condom  Other Topics Concern   Not on file  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     SUMAtriptan (IMITREX) 100 MG tablet SMARTSIG:1 Tablet(s) By Mouth 1-2 Times Daily     mupirocin cream (BACTROBAN) 2 % Apply 1 application topically 2 (two) times daily. 15 g 0   No facility-administered medications prior to visit.    Allergies  Allergen Reactions   Verapamil Hives   Macrobid [Nitrofurantoin Macrocrystal]     Light headed   Metronidazole Other (See Comments)    Lightheadedness    Review of Systems  Constitutional:  Negative for fever.  HENT:  Negative for congestion, sinus pain and sore throat.   Respiratory:  Negative for cough, shortness of breath and wheezing.   Cardiovascular:  Negative for palpitations.  Gastrointestinal:  Negative for blood in stool, constipation, diarrhea, nausea and vomiting.  Genitourinary:  Negative for dysuria, frequency and hematuria.  Musculoskeletal:  Negative for joint pain and myalgias.  Skin:  Negative for rash.       (-) [place]  Neurological:  Negative for headaches.  Psychiatric/Behavioral:  Negative for depression. The patient is not nervous/anxious.        Objective:    Physical Exam Exam conducted with a chaperone present.  Constitutional:      General: She is not in acute distress.    Appearance: Normal appearance. She is not ill-appearing.  HENT:     Head: Normocephalic and atraumatic.     Right Ear: Tympanic membrane, ear canal and external ear normal.     Left Ear: Tympanic membrane, ear canal and external ear normal. Decreased hearing noted.     Ears:     Comments: Scarring on the left ear.    Mouth/Throat:     Pharynx: No oropharyngeal exudate.  Eyes:     Extraocular Movements: Extraocular movements intact.     Pupils: Pupils are equal, round, and reactive to light.  Neck:     Thyroid: No thyromegaly.  Cardiovascular:     Rate and Rhythm: Normal rate and regular rhythm.     Heart sounds: Normal  heart sounds. No murmur heard.    No gallop.  Pulmonary:     Effort: Pulmonary effort is normal. No respiratory distress.     Breath sounds: Normal breath sounds. No wheezing or rales.  Chest:  Breasts:    Right: No mass or nipple discharge.     Left: No mass or nipple discharge.  Abdominal:     General: Bowel sounds are normal. There is no distension.     Palpations: Abdomen is soft.     Tenderness: There is  no abdominal tenderness. There is no guarding.  Genitourinary:    General: Normal vulva.     Exam position: Lithotomy position.     Labia:        Right: No rash or tenderness.        Left: No rash or tenderness.      Urethra: No prolapse, urethral pain, urethral swelling or urethral lesion.     Vagina: Normal. No vaginal discharge.     Cervix: Normal.     Uterus: Normal.      Adnexa:        Right: No mass or tenderness.         Left: No mass or tenderness.       Rectum: Normal.  Lymphadenopathy:     Cervical: No cervical adenopathy.     Upper Body:     Right upper body: No axillary adenopathy.     Left upper body: No axillary adenopathy.     Lower Body: No right inguinal adenopathy. No left inguinal adenopathy.  Skin:    General: Skin is warm and dry.  Neurological:     Mental Status: She is alert and oriented to person, place, and time.  Psychiatric:        Mood and Affect: Mood normal.        Behavior: Behavior normal.        Judgment: Judgment normal.     BP 114/88 (BP Location: Right Arm, Patient Position: Sitting, Cuff Size: Small)   Pulse 66   Temp 98.7 F (37.1 C) (Oral)   Resp 16   Wt 157 lb (71.2 kg)   SpO2 99%   BMI 24.59 kg/m  Wt Readings from Last 3 Encounters:  03/30/22 157 lb (71.2 kg)  05/21/21 150 lb 12.8 oz (68.4 kg)  02/09/21 148 lb (67.1 kg)       Assessment & Plan:   Problem List Items Addressed This Visit       Unprioritized   Preventative health care - Primary    Wt Readings from Last 3 Encounters:  03/30/22 157 lb (71.2  kg)  05/21/21 150 lb 12.8 oz (68.4 kg)  02/09/21 148 lb (67.1 kg)  Continue healthy diet, exercise, weight loss efforts. Pap up to date. Flu shot today. Will get covid shot at pharmacy.       Migraines    Has migraines prior to period. Uses imitrex prn which is helpful.        Relevant Medications   SUMAtriptan (IMITREX) 100 MG tablet   Chronic right-sided low back pain without sciatica    Stable with stretching.       Other Visit Diagnoses     Needs flu shot       Relevant Orders   Flu Vaccine QUAD 6+ mos PF IM (Fluarix Quad PF) (Completed)   Screening examination for STD (sexually transmitted disease)       Relevant Orders   TSH   HIV antibody (with reflex)   Hepatitis C Antibody   RPR   Cervicovaginal ancillary only( [facility])   Encounter for screening mammogram for malignant neoplasm of breast       Relevant Orders   MM 3D SCREEN BREAST W/IMPLANT BILATERAL        No orders of the defined types were placed in this encounter.   I, Nance Pear, NP, personally preformed the services described in this documentation.  All medical record entries made by the scribe were at my direction  and in my presence.  I have reviewed the chart and discharge instructions (if applicable) and agree that the record reflects my personal performance and is accurate and complete. 03/30/2022   I,Amber Collins,acting as a scribe for Lemont Fillers, NP.,have documented all relevant documentation on the behalf of Lemont Fillers, NP,as directed by  Lemont Fillers, NP while in the presence of Lemont Fillers, NP.     Lemont Fillers, NP

## 2022-03-31 LAB — CERVICOVAGINAL ANCILLARY ONLY
Chlamydia: NEGATIVE
Comment: NEGATIVE
Comment: NEGATIVE
Comment: NORMAL
Neisseria Gonorrhea: NEGATIVE
Trichomonas: NEGATIVE

## 2022-03-31 LAB — RPR: RPR Ser Ql: NONREACTIVE

## 2022-03-31 LAB — HEPATITIS C ANTIBODY: Hepatitis C Ab: NONREACTIVE

## 2022-03-31 LAB — HIV ANTIBODY (ROUTINE TESTING W REFLEX): HIV 1&2 Ab, 4th Generation: NONREACTIVE

## 2022-04-12 ENCOUNTER — Encounter (HOSPITAL_BASED_OUTPATIENT_CLINIC_OR_DEPARTMENT_OTHER): Payer: Self-pay

## 2022-04-12 ENCOUNTER — Ambulatory Visit (HOSPITAL_BASED_OUTPATIENT_CLINIC_OR_DEPARTMENT_OTHER)
Admission: RE | Admit: 2022-04-12 | Discharge: 2022-04-12 | Disposition: A | Discharge status: Home / Self Care | Payer: No Typology Code available for payment source | Source: Ambulatory Visit | Attending: Family | Admitting: Family

## 2022-04-12 DIAGNOSIS — Z1231 Encounter for screening mammogram for malignant neoplasm of breast: Secondary | ICD-10-CM | POA: Insufficient documentation

## 2022-04-19 ENCOUNTER — Encounter (INDEPENDENT_AMBULATORY_CARE_PROVIDER_SITE_OTHER): Payer: No Typology Code available for payment source | Admitting: Family

## 2022-04-19 DIAGNOSIS — N760 Acute vaginitis: Secondary | ICD-10-CM | POA: Diagnosis not present

## 2022-04-20 MED ORDER — FLUCONAZOLE 150 MG PO TABS: 150.0000 mg | ORAL_TABLET | Freq: Once | ORAL | 0 refills | Status: AC

## 2022-04-20 NOTE — Telephone Encounter (Signed)
Please see the MyChart message reply(ies) for my assessment and plan.  The patient gave consent for this Medical Advice Message and is aware that it may result in a bill to their insurance company as well as the possibility that this may result in a co-payment or deductible. They are an established patient, but are not seeking medical advice exclusively about a problem treated during an in person or video visit in the last 7 days. I did not recommend an in person or video visit within 7 days of my reply.  I spent a total of 5 minutes cumulative provider time within 7 days through MyChart messaging.  Mory Herrman S O'Sullivan, NP  

## 2022-04-23 ENCOUNTER — Encounter: Payer: Self-pay | Admitting: Family

## 2022-07-18 ENCOUNTER — Other Ambulatory Visit: Payer: Self-pay | Admitting: Family

## 2022-07-19 ENCOUNTER — Telehealth: Payer: Self-pay | Admitting: *Deleted

## 2022-07-19 MED ORDER — SUMATRIPTAN SUCCINATE 100 MG PO TABS: ORAL_TABLET | ORAL | 0 refills | Status: DC

## 2022-07-19 NOTE — Telephone Encounter (Signed)
Patient comment: Maria Hernandez needs a new prescription thx

## 2022-07-19 NOTE — Telephone Encounter (Signed)
Rx sent in.

## 2022-07-19 NOTE — Telephone Encounter (Signed)
[facility] [address] sent a request for sumatriptan.  Ok to fill?

## 2022-07-28 ENCOUNTER — Encounter: Payer: Self-pay | Admitting: Family

## 2022-07-29 MED ORDER — BETAMETHASONE DIPROPIONATE 0.05 % EX CREA: TOPICAL_CREAM | Freq: Two times a day (BID) | CUTANEOUS | 0 refills | Status: DC

## 2022-08-02 ENCOUNTER — Ambulatory Visit (INDEPENDENT_AMBULATORY_CARE_PROVIDER_SITE_OTHER): Payer: 59 | Admitting: Family

## 2022-08-02 ENCOUNTER — Other Ambulatory Visit (HOSPITAL_COMMUNITY)
Admission: RE | Admit: 2022-08-02 | Discharge: 2022-08-02 | Disposition: A | Discharge status: Home / Self Care | Payer: 59 | Source: Ambulatory Visit | Attending: Family | Admitting: Family

## 2022-08-02 ENCOUNTER — Encounter: Payer: Self-pay | Admitting: Family

## 2022-08-02 VITALS — BP 127/84 | HR 88 | Temp 98.7°F | Resp 16 | Wt 160.0 lb

## 2022-08-02 DIAGNOSIS — Z113 Encounter for screening for infections with a predominantly sexual mode of transmission: Secondary | ICD-10-CM | POA: Diagnosis not present

## 2022-08-02 NOTE — Progress Notes (Addendum)
Subjective:   By signing my name below, I, Maria Hernandez, attest that this documentation has been prepared under the direction and in the presence of Maria Alar, NP. 08/02/2022   Patient ID: Maria Hernandez, female    DOB: 06-11-1976, 47 y.o.   MRN: [id number]  Chief Complaint  Patient presents with   STD evaluation    "Will like to be checked for std after ending a relationship     HPI Patient is in today for a office visit.   She is requesting a STD screening. She is recently ended a relationship and wanted to take precautions. She has no symptoms.    Past Medical History:  Diagnosis Date   Migraines     Past Surgical History:  Procedure Laterality Date   AUGMENTATION MAMMAPLASTY Bilateral    BREAST ENHANCEMENT SURGERY Bilateral 2005   INNER EAR SURGERY Left    Reports 2 prior ear surgeries for prosthesis. 3rd surgery coming up in January 2019   Gantt    Family History  Problem Relation Age of Onset   Hypothyroidism Mother        died from covid   Hypertension Father    Diabetes type II Father        BKA   Arthritis Brother    Retinal detachment Brother    Breast cancer Maternal Grandmother     Social History   Socioeconomic History   Marital status: Single    Spouse name: Not on file   Number of children: Not on file   Years of education: Not on file   Highest education level: Not on file  Occupational History   Not on file  Tobacco Use   Smoking status: Never   Smokeless tobacco: Never  Vaping Use   Vaping Use: Never used  Substance and Sexual Activity   Alcohol use: Yes    Comment: occasional wine "1- 2 per month"   Drug use: Never   Sexual activity: Not Currently    Birth control/protection: None, Condom  Other Topics Concern   Not on file  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 30 g 0   Multiple Vitamin (MULTIVITAMIN) tablet Take 1 tablet by mouth daily.     SUMAtriptan (IMITREX) 100 MG tablet Take 1 tab once daily.  May repeat in 2 hours if headache persists or recurs.  (Max of 2 tabs in 24 hours) 10 tablet 0   No facility-administered medications prior to visit.    Allergies  Allergen Reactions   Verapamil Hives   Macrobid [Nitrofurantoin Macrocrystal]     Light headed   Metronidazole Other (See Comments)    Lightheadedness    ROS See HPI    Objective:    Physical Exam Exam conducted with a chaperone present.  Constitutional:      General: She  is not in acute distress.    Appearance: Normal appearance. She is not ill-appearing.  HENT:     Head: Normocephalic and atraumatic.     Right Ear: External ear normal.     Left Ear: External ear normal.  Eyes:     Extraocular Movements: Extraocular movements intact.     Pupils: Pupils are equal, round, and reactive to light.  Cardiovascular:     Rate and Rhythm: Normal rate.  Pulmonary:     Effort: Pulmonary effort is normal.  Genitourinary:    Exam position: Lithotomy position.     Pubic Area: No rash.      Labia:        Right: No rash.        Left: No rash.   Skin:    General: Skin is warm and dry.  Neurological:     Mental Status: She is alert and oriented to person, place, and time.  Psychiatric:        Judgment: Judgment normal.     BP 127/84 (BP Location: Right Arm, Patient Position: Sitting, Cuff Size: Small)   Pulse 88   Temp 98.7 F (37.1 C) (Oral)   Resp 16    Wt 160 lb (72.6 kg)   SpO2 98%   BMI 25.06 kg/m  Wt Readings from Last 3 Encounters:  08/02/22 160 lb (72.6 kg)  03/30/22 157 lb (71.2 kg)  05/21/21 150 lb 12.8 oz (68.4 kg)       Assessment & Plan:  Routine screening for STI (sexually transmitted infection) -     RPR -     HIV Antibody (routine testing w rflx) -     Cervicovaginal ancillary only    I, Nance Pear, NP, personally preformed the services described in this documentation.  All medical record entries made by the scribe were at my direction and in my presence.  I have reviewed the chart and discharge instructions (if applicable) and agree that the record reflects my personal performance and is accurate and complete. 08/02/2022   I,Maria Hernandez,acting as a Education administrator for Nance Pear, NP.,have documented all relevant documentation on the behalf of Nance Pear, NP,as directed by  Nance Pear, NP while in the presence of Nance Pear, NP.   Nance Pear, NP

## 2022-08-03 LAB — CERVICOVAGINAL ANCILLARY ONLY
Chlamydia: NEGATIVE
Comment: NEGATIVE
Comment: NEGATIVE
Comment: NORMAL
Neisseria Gonorrhea: NEGATIVE
Trichomonas: NEGATIVE

## 2022-08-06 NOTE — Addendum Note (Signed)
Addended by: Kelle Darting A on: 08/06/2022 09:30 AM   Modules accepted: Orders

## 2022-08-10 LAB — HSV 2 ANTIBODY, IGG: HSV 2 Glycoprotein G Ab, IgG: <0.9 index

## 2022-08-10 LAB — RPR: RPR Ser Ql: NONREACTIVE

## 2022-08-10 LAB — HIV ANTIBODY (ROUTINE TESTING W REFLEX): HIV 1&2 Ab, 4th Generation: NONREACTIVE

## 2022-09-24 ENCOUNTER — Encounter: Payer: Self-pay | Admitting: Family

## 2022-09-24 DIAGNOSIS — R21 Rash and other nonspecific skin eruption: Secondary | ICD-10-CM

## 2022-09-27 ENCOUNTER — Other Ambulatory Visit: Payer: Self-pay | Admitting: Family

## 2022-09-27 MED ORDER — SUMATRIPTAN SUCCINATE 100 MG PO TABS: ORAL_TABLET | ORAL | 3 refills | Status: DC

## 2022-10-09 ENCOUNTER — Telehealth: Payer: 59 | Admitting: Nurse Practitioner

## 2022-10-09 DIAGNOSIS — R21 Rash and other nonspecific skin eruption: Secondary | ICD-10-CM | POA: Diagnosis not present

## 2022-10-09 MED ORDER — PREDNISONE 20 MG PO TABS: 40.0000 mg | ORAL_TABLET | Freq: Every day | ORAL | 0 refills | Status: AC

## 2022-10-09 NOTE — Progress Notes (Signed)
E Visit for Rash  We are sorry that you are not feeling well. Here is how we plan to help!  I am not sure what this rash is. It appears to be some form of dermatitis. Since you have tried steroid creams I will send in prednisone 20mg  2 pills at e same time daily for 5 days. If this does not help you will have to wait until your dermatology appointment or you can call them and see if they can see you sooner.    HOME CARE:  Take cool showers and avoid direct sunlight. Apply cool compress or wet dressings. Take a bath in an oatmeal bath.  Sprinkle content of one Aveeno packet under running faucet with comfortably warm water.  Bathe for 15-20 minutes, 1-2 times daily.  Pat dry with a towel. Do not rub the rash. Use hydrocortisone cream. Take an antihistamine like Benadryl for widespread rashes that itch.  The adult dose of Benadryl is 25-50 mg by mouth 4 times daily. Caution:  This type of medication may cause sleepiness.  Do not drink alcohol, drive, or operate dangerous machinery while taking antihistamines.  Do not take these medications if you have prostate enlargement.  Read package instructions thoroughly on all medications that you take.  GET HELP RIGHT AWAY IF:  Symptoms don't go away after treatment. Severe itching that persists. If you rash spreads or swells. If you rash begins to smell. If it blisters and opens or develops a yellow-brown crust. You develop a fever. You have a sore throat. You become short of breath.  MAKE SURE YOU:  Understand these instructions. Will watch your condition. Will get help right away if you are not doing well or get worse.  Thank you for choosing an e-visit.  Your e-visit answers were reviewed by a board certified advanced clinical practitioner to complete your personal care plan. Depending upon the condition, your plan could have included both over the counter or prescription medications.  Please review your pharmacy choice. Make sure the  pharmacy is open so you can pick up prescription now. If there is a problem, you may contact your provider through [employer] and have the prescription routed to another pharmacy.  Your safety is important to [place]. If you have drug allergies check your prescription carefully.   For the next 24 hours you can use MyChart to ask questions about today's visit, request a non-urgent call back, or ask for a work or school excuse. You will get an email in the next two days asking about your experience. I hope that your e-visit has been valuable and will speed your recovery.   Mary-Margaret Daphine Deutscher, FNP   5-10 minutes spent reviewing and documenting in chart.

## 2022-10-16 MED ORDER — MUPIROCIN CALCIUM 2 % EX CREA: 1.0000 | TOPICAL_CREAM | Freq: Two times a day (BID) | CUTANEOUS | 0 refills | Status: DC

## 2022-10-16 NOTE — Addendum Note (Signed)
Addended by: Bennie Pierini on: 10/16/2022 10:48 AM   Modules accepted: Orders

## 2022-11-01 DIAGNOSIS — L710 Perioral dermatitis: Secondary | ICD-10-CM | POA: Diagnosis not present

## 2023-01-22 ENCOUNTER — Encounter: Payer: Self-pay | Admitting: Family

## 2023-01-24 ENCOUNTER — Other Ambulatory Visit: Payer: Self-pay | Admitting: Family

## 2023-01-24 DIAGNOSIS — Z309 Encounter for contraceptive management, unspecified: Secondary | ICD-10-CM

## 2023-01-27 ENCOUNTER — Encounter: Payer: Self-pay | Admitting: Family

## 2023-02-08 ENCOUNTER — Other Ambulatory Visit (HOSPITAL_COMMUNITY)
Admission: RE | Admit: 2023-02-08 | Discharge: 2023-02-08 | Disposition: A | Discharge status: Home / Self Care | Payer: 59 | Source: Ambulatory Visit | Attending: Family | Admitting: Family

## 2023-02-08 ENCOUNTER — Ambulatory Visit (INDEPENDENT_AMBULATORY_CARE_PROVIDER_SITE_OTHER): Payer: 59 | Admitting: Family

## 2023-02-08 VITALS — BP 107/77 | HR 63 | Temp 98.0°F | Resp 16 | Wt 162.0 lb

## 2023-02-08 DIAGNOSIS — Z113 Encounter for screening for infections with a predominantly sexual mode of transmission: Secondary | ICD-10-CM | POA: Diagnosis not present

## 2023-02-08 NOTE — Patient Instructions (Signed)
VISIT SUMMARY:  During your visit, we discussed your request for a routine sexual health screening after ending a relationship. You mentioned that you have no symptoms and are not suspecting any infections, but you want to ensure you are healthy for future partners. You also mentioned spotting between periods, but did not seem overly concerned about this symptom.  YOUR PLAN:  -SEXUAL HEALTH SCREENING: We will perform a vaginal swab for Gonorrhea, Chlamydia, and Trichomonas, which are sexually transmitted infections. We will also order blood work for HIV, Hepatitis C, Syphilis, and Herpes Type 2 IgG antibodies. These tests will help [place] ensure that you are healthy and do not have any sexually transmitted infections.

## 2023-02-08 NOTE — Assessment & Plan Note (Signed)
Will obtain labs as ordered. Pt asymptomatic.

## 2023-02-08 NOTE — Addendum Note (Signed)
Addended by: Thelma Barge D on: 02/08/2023 11:54 AM   Modules accepted: Orders

## 2023-02-08 NOTE — Progress Notes (Signed)
Subjective:     Patient ID: Maria Hernandez, female    DOB: 12-29-1975, 47 y.o.   MRN: [id number]  Chief Complaint  Patient presents with   Screening for STD    Will like to be tested for STD, no symptoms.     HPI  Discussed the use of AI scribe software for clinical note transcription with the patient, who gave verbal consent to proceed.  History of Present Illness   The patient presents for routine sexually transmitted infection (STI) screening after ending a relationship. She denies any symptoms and is not suspecting any infections, but wants to ensure she is 'clean' for future partners. Her last STI screening was in January of this year. She also mentions that she is not due for a Pap smear and HPV test until next year. The patient also mentions spotting between periods, but does not seem overly concerned about this symptom.          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 3rd surgery coming up in January 2019   MANDIBLE OSTEOTOMY  1995    Family History  Problem Relation Age of Onset   Hypothyroidism Mother        died from covid   Hypertension Father    Diabetes type II Father        BKA   Arthritis Brother    Retinal detachment Brother    Breast cancer Maternal Grandmother     Social History   Socioeconomic History   Marital status: Single    Spouse name: Not on file   Number of children: Not on file   Years of education: Not on file   Highest education level: Not on file  Occupational History   Not on file  Tobacco Use   Smoking status: Never   Smokeless tobacco: Never   Vaping Use   Vaping status: Never Used  Substance and Sexual Activity   Alcohol use: Yes    Comment: occasional wine "1- 2 per month"   Drug use: Never   Sexual activity: Not Currently    Birth control/protection: None, Condom  Other Topics Concern   Not on file  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   Enjoys movies, shopping, relaxing   Does 3 12 hour shifts   Single   No children   No pets   Social Determinants of Health   Financial Resource Strain: Patient Declined (02/08/2023)   Overall Financial Resource Strain (CARDIA)    Difficulty of Paying Living Expenses: Patient declined  Food Insecurity: Patient Declined (02/08/2023)   Hunger Vital Sign    Worried About Running Out of Food in the Last Year: Patient declined    Ran Out of Food in the Last Year: Patient declined  Transportation Needs: Patient Declined (02/08/2023)   PRAPARE - Transportation    Lack of Transportation (Medical): Patient declined    Lack of Transportation (Non-Medical): Patient declined  Physical Activity: Sufficiently Active (02/08/2023)   Exercise Vital Sign  Days of Exercise per Week: 3 days    Minutes of Exercise per Session: 50 min  Stress: No Stress Concern Present (02/08/2023)   [employer] of Occupational Health - Occupational Stress Questionnaire    Feeling of Stress : Only a little  Social Connections: Unknown (02/08/2023)   Social Connection and Isolation Panel [NHANES]    Frequency of Communication with Friends and Family: More than three times a week    Frequency of Social Gatherings with Friends and Family: More than three times a week    Attends Religious Services: More than 4 times per year    Active Member of [employer] or Organizations: No    Attends Engineer, structural: Not on file    Marital Status: Patient declined  Intimate Partner Violence: Not on file    Outpatient Medications Prior to Visit  Medication Sig Dispense Refill    Multiple Vitamin (MULTIVITAMIN) tablet Take 1 tablet by mouth daily.     SUMAtriptan (IMITREX) 100 MG tablet Take 1 tab once daily.  May repeat in 2 hours if headache persists or recurs.  (Max of 2 tabs in 24 hours) 10 tablet 3   betamethasone dipropionate 0.05 % cream Apply topically 2 (two) times daily. 30 g 0   mupirocin cream (BACTROBAN) 2 % Apply 1 Application topically 2 (two) times daily. 15 g 0   No facility-administered medications prior to visit.    Allergies  Allergen Reactions   Verapamil Hives   Macrobid [Nitrofurantoin Macrocrystal]     Light headed   Metronidazole Other (See Comments)    Lightheadedness    ROS See HPI    Objective:    Physical Exam Constitutional:      Appearance: Normal appearance.  Pulmonary:     Effort: Pulmonary effort is normal.  Genitourinary:    General: Normal vulva.  Neurological:     Mental Status: She is alert and oriented to person, place, and time.  Psychiatric:        Mood and Affect: Mood normal.        Behavior: Behavior normal.        Thought Content: Thought content normal.        Judgment: Judgment normal.      BP 107/77 (BP Location: Right Arm, Patient Position: Sitting, Cuff Size: Small)   Pulse 63   Temp 98 F (36.7 C) (Oral)   Resp 16   Wt 162 lb (73.5 kg)   SpO2 100%   BMI 25.37 kg/m  Wt Readings from Last 3 Encounters:  02/08/23 162 lb (73.5 kg)  08/02/22 160 lb (72.6 kg)  03/30/22 157 lb (71.2 kg)       Assessment & Plan:   Problem List Items Addressed This Visit       Unprioritized   Screening examination for STD (sexually transmitted disease) - Primary    Will obtain labs as ordered. Pt asymptomatic.       Relevant Orders   HIV Antibody (routine testing w rflx)   HSV 2 antibody, IgG   Hepatitis C Antibody   RPR   Cervicovaginal ancillary only( Scissors)    I have discontinued Rossana J. Closson's betamethasone dipropionate and mupirocin cream. I am also having her maintain her  multivitamin and SUMAtriptan.  No orders of the defined types were placed in this encounter.

## 2023-03-21 ENCOUNTER — Other Ambulatory Visit (HOSPITAL_COMMUNITY)
Admission: RE | Admit: 2023-03-21 | Discharge: 2023-03-21 | Disposition: A | Discharge status: Home / Self Care | Payer: 59 | Source: Ambulatory Visit | Attending: Family Medicine | Admitting: Family Medicine

## 2023-03-21 ENCOUNTER — Other Ambulatory Visit (HOSPITAL_BASED_OUTPATIENT_CLINIC_OR_DEPARTMENT_OTHER): Payer: Self-pay

## 2023-03-21 ENCOUNTER — Encounter: Payer: Self-pay | Admitting: Family

## 2023-03-21 ENCOUNTER — Ambulatory Visit: Payer: Self-pay

## 2023-03-21 ENCOUNTER — Other Ambulatory Visit (HOSPITAL_BASED_OUTPATIENT_CLINIC_OR_DEPARTMENT_OTHER): Payer: Self-pay | Admitting: Family

## 2023-03-21 ENCOUNTER — Ambulatory Visit (INDEPENDENT_AMBULATORY_CARE_PROVIDER_SITE_OTHER): Payer: 59 | Admitting: Family Medicine

## 2023-03-21 ENCOUNTER — Encounter: Payer: Self-pay | Admitting: Family Medicine

## 2023-03-21 VITALS — BP 122/80 | HR 77 | Temp 97.5°F | Ht 67.0 in | Wt 162.0 lb

## 2023-03-21 DIAGNOSIS — N760 Acute vaginitis: Secondary | ICD-10-CM

## 2023-03-21 DIAGNOSIS — Z113 Encounter for screening for infections with a predominantly sexual mode of transmission: Secondary | ICD-10-CM | POA: Insufficient documentation

## 2023-03-21 DIAGNOSIS — Z23 Encounter for immunization: Secondary | ICD-10-CM | POA: Diagnosis not present

## 2023-03-21 DIAGNOSIS — R300 Dysuria: Secondary | ICD-10-CM | POA: Diagnosis not present

## 2023-03-21 DIAGNOSIS — G8929 Other chronic pain: Secondary | ICD-10-CM

## 2023-03-21 DIAGNOSIS — Z1231 Encounter for screening mammogram for malignant neoplasm of breast: Secondary | ICD-10-CM

## 2023-03-21 LAB — POC URINALSYSI DIPSTICK (AUTOMATED)
Glucose, UA: NEGATIVE
Ketones, UA: NEGATIVE
Nitrite, UA: POSITIVE
Protein, UA: NEGATIVE
Spec Grav, UA: 1.01 (ref 1.010–1.025)
Urobilinogen, UA: 0.2 U/dL
pH, UA: 6 (ref 5.0–8.0)

## 2023-03-21 LAB — URINALYSIS, MICROSCOPIC ONLY

## 2023-03-21 MED ORDER — COVID-19 MRNA VAC-TRIS(PFIZER) 30 MCG/0.3ML IM SUSY
0.3000 mL | PREFILLED_SYRINGE | Freq: Once | INTRAMUSCULAR | 0 refills | Status: AC
Start: 2023-03-21 — End: 2023-03-22
  Filled 2023-03-21: qty 0.3, 1d supply, fill #0

## 2023-03-21 MED ORDER — FLUCONAZOLE 150 MG PO TABS
ORAL_TABLET | ORAL | 0 refills | Status: DC
Start: 2023-03-21 — End: 2023-03-24

## 2023-03-21 MED ORDER — SULFAMETHOXAZOLE-TRIMETHOPRIM 800-160 MG PO TABS
1.0000 | ORAL_TABLET | Freq: Two times a day (BID) | ORAL | 0 refills | Status: DC
Start: 2023-03-21 — End: 2023-03-24

## 2023-03-21 NOTE — Progress Notes (Signed)
Acute Office Visit  Subjective:     Patient ID: Maria Hernandez, female    DOB: [birth year], 47 y.o.   MRN: [id number]  Chief Complaint  Patient presents with   Urinary Tract Infection     Patient is in today for dysuria.   Discussed the use of AI scribe software for clinical note transcription with the patient, who gave verbal consent to proceed.  History of Present Illness   The patient presented with urinary symptoms that began the previous day, characterized by a burning sensation in the bladder. They described the discomfort as intense, likening it to a 'bladder on fire.' To manage the discomfort, they self-administered Pyridium and Monistat. They reported increased urinary frequency on the day of the consultation but denied any hematuria. The patient also denied experiencing any back pain, fever, nausea, vomiting, or diarrhea. They were unsure if the symptoms were indicative of a urinary tract infection (UTI) or a yeast infection, but they suspected it was one of the two based on their symptomatology. They had not experienced bacterial vaginosis (BV) in several years. She also reports having a new partner and would like STD screening with vaginal swab only.           ROS All review of systems negative except what is listed in the HPI      Objective:    BP 122/80   Pulse 77   Temp (!) 97.5 F (36.4 C) (Oral)   Ht 5\' 7"  (1.702 m)   Wt 162 lb (73.5 kg)   SpO2 100%   BMI 25.37 kg/m    Physical Exam Vitals reviewed.  Constitutional:      Appearance: Normal appearance.  Cardiovascular:     Rate and Rhythm: Normal rate and regular rhythm.     Heart sounds: Normal heart sounds.  Pulmonary:     Effort: Pulmonary effort is normal.     Breath sounds: Normal breath sounds.  Abdominal:     Tenderness: There is no right CVA tenderness or left CVA tenderness.  Skin:    General: Skin is warm and dry.  Neurological:     Mental Status: She is alert and oriented to  person, place, and time.  Psychiatric:        Mood and Affect: Mood normal.        Behavior: Behavior normal.        Thought Content: Thought content normal.        Judgment: Judgment normal.     Results for orders placed or performed in visit on 03/21/23  POCT Urinalysis Dipstick (Automated)  Result Value Ref Range   Color, UA yellow    Clarity, UA clear    Glucose, UA Negative Negative   Bilirubin, UA moderate    Ketones, UA negative    Spec Grav, UA 1.010 1.010 - 1.025   Blood, UA trace    pH, UA 6.0 5.0 - 8.0   Protein, UA Negative Negative   Urobilinogen, UA 0.2 0.2 or 1.0 E.U./dL   Nitrite, UA positive    Leukocytes, UA Moderate (2+) (A) Negative        Assessment & Plan:   Problem List Items Addressed This Visit   None Visit Diagnoses     Dysuria    -  Primary   Relevant Medications   sulfamethoxazole-trimethoprim (BACTRIM DS) 800-160 MG tablet   Other Relevant Orders   POCT Urinalysis Dipstick (Automated) (Completed)   Urine Culture   Urine Microscopic Only  Flu vaccine need       Relevant Orders   Flu vaccine trivalent PF, 6mos and older(Flulaval,Afluria,Fluarix,Fluzone) (Completed)   Screen for STD (sexually transmitted disease)       Relevant Orders   Cervicovaginal ancillary only      -Start Bactrim for suspected UTI. -Continue Pyridium for symptom relief. -Send urine for culture and adjust antibiotics based on results if necessary. -Perform self-swab for yeast/BV, and STD screening       Meds ordered this encounter  Medications   sulfamethoxazole-trimethoprim (BACTRIM DS) 800-160 MG tablet    Sig: Take 1 tablet by mouth 2 (two) times daily for 3 days.    Dispense:  6 tablet    Refill:  0    Order Specific Question:   Supervising Provider    Answer:   Danise Edge A [[id number]]    Return if symptoms worsen or fail to improve.  Clayborne Dana, NP

## 2023-03-21 NOTE — Telephone Encounter (Signed)
Please see the MyChart message reply(ies) for my assessment and plan.  The patient gave consent for this Medical Advice Message and is aware that it may result in a bill to their insurance company as well as the possibility that this may result in a co-payment or deductible. They are an established patient, but are not seeking medical advice exclusively about a problem treated during an in person or video visit in the last 7 days. I did not recommend an in person or video visit within 7 days of my reply.  I spent a total of 5 minutes cumulative provider time within 7 days through [employer].  Lemont Fillers, NP

## 2023-03-23 ENCOUNTER — Encounter: Payer: Self-pay | Admitting: Family Medicine

## 2023-03-23 NOTE — Telephone Encounter (Signed)
Culture not back yet.

## 2023-03-24 ENCOUNTER — Encounter: Payer: Self-pay | Admitting: Family Medicine

## 2023-03-24 DIAGNOSIS — N760 Acute vaginitis: Secondary | ICD-10-CM

## 2023-03-24 DIAGNOSIS — R300 Dysuria: Secondary | ICD-10-CM

## 2023-03-24 LAB — URINE CULTURE
MICRO NUMBER:: 15471329
SPECIMEN QUALITY:: ADEQUATE

## 2023-03-24 MED ORDER — SULFAMETHOXAZOLE-TRIMETHOPRIM 800-160 MG PO TABS: 1.0000 | ORAL_TABLET | Freq: Two times a day (BID) | ORAL | 0 refills | Status: AC

## 2023-03-24 MED ORDER — FLUCONAZOLE 150 MG PO TABS: ORAL_TABLET | ORAL | 0 refills | Status: DC

## 2023-03-24 NOTE — Telephone Encounter (Signed)
Pt called to f/u on mychart message. She wanted to make sure provider saw it before she left today. Advised it was routed to provider. Please send to [employer] [facility] on [address]

## 2023-03-26 ENCOUNTER — Encounter: Payer: Self-pay | Admitting: Family Medicine

## 2023-03-27 NOTE — Progress Notes (Unsigned)
[facility] at [facility] [address], [address] [place], [place] [address] [phone]  Date:  03/28/2023   Name:  Maria Hernandez   DOB:  Dec 18, 1975   MRN:  [id number]  PCP:  Sandford Craze, NP    Chief Complaint: recheck UTI (Or possible yeast)   History of Present Illness:  Maria Hernandez is a 47 y.o. very pleasant female patient who presents with the following:  Patient today with concern of a possible persistent UTI-she is generally in good health Most recent visit with myself was in November 2022  She was seen by Hyman Hopes, nurse practitioner 9/16 with dysuria, urine culture was completed-positive for Klebsiella.  Negative STI She was treated with bactrim for 3 days- then we added on another 3 days of treatment   All STI work-up negative on 9/16-patient did a self swab Neisseria Gonorrhea Negative  Chlamydia Negative  Trichomonas Negative  Bacterial Vaginitis (gardnerella) Negative  Candida Vaginitis Negative  Candida Glabrata Negative  Comment Normal Reference Range Bacterial Vaginosis - Negative  Comment Normal Reference Range Candida Species - Negative  Comment Normal Reference Range Candida Galbrata - Negative  Comment Normal Reference Range Trichomonas - Negative  Comment Normal Reference Ranger Chlamydia - Negative  Comment Normal Reference Range Neisseria Gonorrhea - Negative    She has not felt quite back to normal since she was treated  Pyridium does clear up her sx temporarily  She did have a new sexual partner not too long ago, would like complete STI screening.  She self collected her swab as above, we discussed doing an exam and having another swab collected and she would like to go ahead with this  No hematuria LMP about 2 weeks ago    Patient Active Problem List   Diagnosis Date Noted   Screening examination for STD (sexually transmitted disease) 02/08/2023   Preventative health care 03/30/2022    Knee pain, bilateral 07/16/2019   Chronic right-sided low back pain without sciatica 05/09/2019   Migraines 06/07/2017   Hearing loss 06/07/2017    Past Medical History:  Diagnosis Date   Migraines     Past Surgical History:  Procedure Laterality Date   AUGMENTATION MAMMAPLASTY Bilateral    BREAST ENHANCEMENT SURGERY Bilateral 2005   INNER EAR SURGERY Left    Reports 2 prior ear surgeries for prosthesis. 3rd surgery coming up in January 2019   MANDIBLE OSTEOTOMY  1995    Social History   Tobacco Use   Smoking status: Never   Smokeless tobacco: Never  Vaping Use   Vaping status: Never Used  Substance Use Topics   Alcohol use: Yes    Comment: occasional wine "1- 2 per month"   Drug use: Never    Family History  Problem Relation Age of Onset   Hypothyroidism Mother        died from covid   Hypertension Father    Diabetes type II Father        BKA   Arthritis Brother    Retinal detachment Brother    Breast cancer Maternal Grandmother     Allergies  Allergen Reactions   Verapamil Hives   Macrobid [Nitrofurantoin Macrocrystal]     Light headed   Metronidazole Other (See Comments)    Lightheadedness    Medication list has been reviewed and updated.  Current Outpatient Medications on File Prior to Visit  Medication Sig Dispense Refill   Multiple Vitamin (MULTIVITAMIN) tablet  Take 1 tablet by mouth daily.     SUMAtriptan (IMITREX) 100 MG tablet Take 1 tab once daily.  May repeat in 2 hours if headache persists or recurs.  (Max of 2 tabs in 24 hours) 10 tablet 3   No current facility-administered medications on file prior to visit.    Review of Systems:  As per HPI- otherwise negative.   Physical Examination: Vitals:   03/28/23 1517  BP: 124/80  Pulse: 74  Resp: 18  Temp: 98.2 F (36.8 C)  SpO2: 99%   Vitals:   03/28/23 1517  Weight: 156 lb 9.6 oz (71 kg)  Height: 5\' 7"  (1.702 m)   Body mass index is 24.53 kg/m. Ideal Body Weight: Weight in  (lb) to have BMI = 25: 159.3  GEN: no acute distress.  Looks well, normal weight HEENT: Atraumatic, Normocephalic.  Ears and Nose: No external deformity. CV: RRR, No M/G/R. No JVD. No thrill. No extra heart sounds. PULM: CTA B, no wheezes, crackles, rhonchi. No retractions. No resp. distress. No accessory muscle use. ABD: S, NT, ND, +BS. No rebound. No HSM.  Belly is benign EXTR: No c/c/e PSYCH: Normally interactive. Conversant.  Normal vulva, vagina, cervix, no cervical motion tenderness or adnexal masses or tenderness  Results for orders placed or performed in visit on 03/28/23  Urine Culture   Specimen: Urine  Result Value Ref Range   MICRO NUMBER: [id number]    SPECIMEN QUALITY: Adequate    Sample Source URINE    STATUS: FINAL    Result: No Growth   HIV Antibody (routine testing w rflx)  Result Value Ref Range   HIV 1&2 Ab, 4th Generation NON-REACTIVE NON-REACTIVE  HSV(herpes simplex vrs) 1+2 ab-IgG  Result Value Ref Range   HAV 1 IGG,TYPE SPECIFIC AB <0.90 index   HSV 2 IGG,TYPE SPECIFIC AB <0.90 index  Hepatitis C antibody  Result Value Ref Range   Hepatitis C Ab NON-REACTIVE NON-REACTIVE  RPR  Result Value Ref Range   RPR Ser Ql NON-REACTIVE NON-REACTIVE  Hepatitis B surface antigen  Result Value Ref Range   Hepatitis B Surface Ag NON-REACTIVE NON-REACTIVE  CBC  Result Value Ref Range   WBC 7.1 4.0 - 10.5 K/uL   RBC 4.28 3.87 - 5.11 Mil/uL   Platelets 325.0 150.0 - 400.0 K/uL   Hemoglobin 13.2 12.0 - 15.0 g/dL   HCT 54.0 98.1 - 19.1 %   MCV 93.3 78.0 - 100.0 fl   MCHC 32.9 30.0 - 36.0 g/dL   RDW 47.8 29.5 - 62.1 %  Comprehensive metabolic panel  Result Value Ref Range   Sodium 137 135 - 145 mEq/L   Potassium 4.5 3.5 - 5.1 mEq/L   Chloride 104 96 - 112 mEq/L   CO2 25 19 - 32 mEq/L   Glucose, Bld 95 70 - 99 mg/dL   BUN 15 6 - 23 mg/dL   Creatinine, Ser 3.08 0.40 - 1.20 mg/dL   Total Bilirubin 0.3 0.2 - 1.2 mg/dL   Alkaline Phosphatase 53 39 - 117 U/L   AST  20 0 - 37 U/L   ALT 15 0 - 35 U/L   Total Protein 7.2 6.0 - 8.3 g/dL   Albumin 4.2 3.5 - 5.2 g/dL   GFR 65.78 >46.96 mL/min   Calcium 9.6 8.4 - 10.5 mg/dL  Hemoglobin E9B  Result Value Ref Range   Hgb A1c MFr Bld 5.3 4.6 - 6.5 %  Lipid panel  Result Value Ref Range   Cholesterol  203 (H) 0 - 200 mg/dL   Triglycerides 161.0 0.0 - 149.0 mg/dL   HDL 96.04 >54.09 mg/dL   VLDL 81.1 0.0 - 91.4 mg/dL   LDL Cholesterol 782 (H) 0 - 99 mg/dL   Total CHOL/HDL Ratio 4    NonHDL 145.34   TSH  Result Value Ref Range   TSH 0.84 0.35 - 5.50 uIU/mL  VITAMIN D 25 Hydroxy (Vit-D Deficiency, Fractures)  Result Value Ref Range   VITD 48.99 30.00 - 100.00 ng/mL  POCT urine pregnancy  Result Value Ref Range   Preg Test, Ur Negative Negative  POCT urinalysis dipstick  Result Value Ref Range   Color, UA yellow yellow   Clarity, UA cloudy (A) clear   Glucose, UA negative negative mg/dL   Bilirubin, UA negative negative   Ketones, POC UA negative negative mg/dL   Spec Grav, UA 9.562 1.308 - 1.025   Blood, UA negative negative   pH, UA 6.5 5.0 - 8.0   Protein Ur, POC negative negative mg/dL   Urobilinogen, UA 0.2 0.2 or 1.0 E.U./dL   Nitrite, UA Negative Negative   Leukocytes, UA Negative Negative    Assessment and Plan: Dysuria - Plan: Cervicovaginal ancillary only( [place]), ciprofloxacin (CIPRO) 250 MG tablet, fluconazole (DIFLUCAN) 150 MG tablet  Routine screening for STI (sexually transmitted infection) - Plan: HIV Antibody (routine testing w rflx), HSV(herpes simplex vrs) 1+2 ab-IgG, Hepatitis C antibody, RPR, Hepatitis B surface antigen, Cervicovaginal ancillary only( [place])  Acute cystitis without hematuria - Plan: POCT urine pregnancy, POCT urinalysis dipstick, Urine Culture  Screening for diabetes mellitus - Plan: Comprehensive metabolic panel, Hemoglobin A1c  Thyroid disorder screening - Plan: TSH  Screening for deficiency anemia - Plan: CBC  Fatigue, unspecified type  - Plan: TSH, VITAMIN D 25 Hydroxy (Vit-D Deficiency, Fractures)  Screening, lipid - Plan: Lipid panel  Patient seen today with possible recurrent/persistent UTI.  She would like to go ahead and be treated while we wait on repeat culture, will use Cipro at this time for 5 days Other STI workup, routine labs are pending as above.  She will let me know if any is getting worse or changing in the meantime, otherwise I will be in touch with her labs ASAP  Signed Abbe Amsterdam, MD  End of 9/24, received labs as below.  Message to patient  Results for orders placed or performed in visit on 03/28/23  Urine Culture   Specimen: Urine  Result Value Ref Range   MICRO NUMBER: [id number]    SPECIMEN QUALITY: Adequate    Sample Source URINE    STATUS: FINAL    Result: No Growth   HIV Antibody (routine testing w rflx)  Result Value Ref Range   HIV 1&2 Ab, 4th Generation NON-REACTIVE NON-REACTIVE  HSV(herpes simplex vrs) 1+2 ab-IgG  Result Value Ref Range   HAV 1 IGG,TYPE SPECIFIC AB <0.90 index   HSV 2 IGG,TYPE SPECIFIC AB <0.90 index  Hepatitis C antibody  Result Value Ref Range   Hepatitis C Ab NON-REACTIVE NON-REACTIVE  RPR  Result Value Ref Range   RPR Ser Ql NON-REACTIVE NON-REACTIVE  Hepatitis B surface antigen  Result Value Ref Range   Hepatitis B Surface Ag NON-REACTIVE NON-REACTIVE  CBC  Result Value Ref Range   WBC 7.1 4.0 - 10.5 K/uL   RBC 4.28 3.87 - 5.11 Mil/uL   Platelets 325.0 150.0 - 400.0 K/uL   Hemoglobin 13.2 12.0 - 15.0 g/dL   HCT 29.5 28.4 - 13.2 %  MCV 93.3 78.0 - 100.0 fl   MCHC 32.9 30.0 - 36.0 g/dL   RDW 81.1 91.4 - 78.2 %  Comprehensive metabolic panel  Result Value Ref Range   Sodium 137 135 - 145 mEq/L   Potassium 4.5 3.5 - 5.1 mEq/L   Chloride 104 96 - 112 mEq/L   CO2 25 19 - 32 mEq/L   Glucose, Bld 95 70 - 99 mg/dL   BUN 15 6 - 23 mg/dL   Creatinine, Ser 9.56 0.40 - 1.20 mg/dL   Total Bilirubin 0.3 0.2 - 1.2 mg/dL   Alkaline Phosphatase 53 39 -  117 U/L   AST 20 0 - 37 U/L   ALT 15 0 - 35 U/L   Total Protein 7.2 6.0 - 8.3 g/dL   Albumin 4.2 3.5 - 5.2 g/dL   GFR 21.30 >86.57 mL/min   Calcium 9.6 8.4 - 10.5 mg/dL  Hemoglobin Q4O  Result Value Ref Range   Hgb A1c MFr Bld 5.3 4.6 - 6.5 %  Lipid panel  Result Value Ref Range   Cholesterol 203 (H) 0 - 200 mg/dL   Triglycerides 962.9 0.0 - 149.0 mg/dL   HDL 52.84 >13.24 mg/dL   VLDL 40.1 0.0 - 02.7 mg/dL   LDL Cholesterol 253 (H) 0 - 99 mg/dL   Total CHOL/HDL Ratio 4    NonHDL 145.34   TSH  Result Value Ref Range   TSH 0.84 0.35 - 5.50 uIU/mL  VITAMIN D 25 Hydroxy (Vit-D Deficiency, Fractures)  Result Value Ref Range   VITD 48.99 30.00 - 100.00 ng/mL  POCT urine pregnancy  Result Value Ref Range   Preg Test, Ur Negative Negative  POCT urinalysis dipstick  Result Value Ref Range   Color, UA yellow yellow   Clarity, UA cloudy (A) clear   Glucose, UA negative negative mg/dL   Bilirubin, UA negative negative   Ketones, POC UA negative negative mg/dL   Spec Grav, UA 6.644 0.347 - 1.025   Blood, UA negative negative   pH, UA 6.5 5.0 - 8.0   Protein Ur, POC negative negative mg/dL   Urobilinogen, UA 0.2 0.2 or 1.0 E.U./dL   Nitrite, UA Negative Negative   Leukocytes, UA Negative Negative   Addnd 9/25- labs updated

## 2023-03-28 ENCOUNTER — Ambulatory Visit (INDEPENDENT_AMBULATORY_CARE_PROVIDER_SITE_OTHER): Payer: 59 | Admitting: Family Medicine

## 2023-03-28 ENCOUNTER — Other Ambulatory Visit (HOSPITAL_COMMUNITY)
Admission: RE | Admit: 2023-03-28 | Discharge: 2023-03-28 | Disposition: A | Discharge status: Home / Self Care | Payer: 59 | Source: Ambulatory Visit | Attending: Family Medicine | Admitting: Family Medicine

## 2023-03-28 VITALS — BP 124/80 | HR 74 | Temp 98.2°F | Resp 18 | Ht 67.0 in | Wt 156.6 lb

## 2023-03-28 DIAGNOSIS — Z13220 Encounter for screening for lipoid disorders: Secondary | ICD-10-CM

## 2023-03-28 DIAGNOSIS — N3000 Acute cystitis without hematuria: Secondary | ICD-10-CM | POA: Diagnosis not present

## 2023-03-28 DIAGNOSIS — R5383 Other fatigue: Secondary | ICD-10-CM

## 2023-03-28 DIAGNOSIS — Z131 Encounter for screening for diabetes mellitus: Secondary | ICD-10-CM

## 2023-03-28 DIAGNOSIS — Z130 Encounter for screening for diseases of the blood and blood-forming organs and certain disorders involving the immune mechanism: Secondary | ICD-10-CM | POA: Diagnosis not present

## 2023-03-28 DIAGNOSIS — Z113 Encounter for screening for infections with a predominantly sexual mode of transmission: Secondary | ICD-10-CM | POA: Insufficient documentation

## 2023-03-28 DIAGNOSIS — Z1329 Encounter for screening for other suspected endocrine disorder: Secondary | ICD-10-CM

## 2023-03-28 DIAGNOSIS — R300 Dysuria: Secondary | ICD-10-CM

## 2023-03-28 LAB — POCT URINALYSIS DIP (MANUAL ENTRY)
Bilirubin, UA: NEGATIVE
Blood, UA: NEGATIVE
Glucose, UA: NEGATIVE mg/dL
Ketones, POC UA: NEGATIVE mg/dL
Leukocytes, UA: NEGATIVE
Nitrite, UA: NEGATIVE
Protein Ur, POC: NEGATIVE mg/dL
Spec Grav, UA: 1.01 (ref 1.010–1.025)
Urobilinogen, UA: 0.2 E.U./dL
pH, UA: 6.5 (ref 5.0–8.0)

## 2023-03-28 LAB — POCT URINE PREGNANCY: Preg Test, Ur: NEGATIVE

## 2023-03-28 MED ORDER — FLUCONAZOLE 150 MG PO TABS
150.0000 mg | ORAL_TABLET | Freq: Once | ORAL | 0 refills | Status: AC
Start: 2023-03-28 — End: 2023-03-28

## 2023-03-28 MED ORDER — CIPROFLOXACIN HCL 250 MG PO TABS
250.0000 mg | ORAL_TABLET | Freq: Two times a day (BID) | ORAL | 0 refills | Status: AC
Start: 2023-03-28 — End: 2023-04-02

## 2023-03-28 NOTE — Patient Instructions (Signed)
It was good to see you today- I will be in touch with your labs asap Ok to take cipro twice a day for 5 days starting now, while we wait on your culture

## 2023-03-29 ENCOUNTER — Encounter: Payer: Self-pay | Admitting: Family Medicine

## 2023-03-29 LAB — LIPID PANEL
Cholesterol: 203 mg/dL — ABNORMAL HIGH (ref 0–200)
HDL: 57.3 mg/dL (ref >39.00)
LDL Cholesterol: 120 mg/dL — ABNORMAL HIGH (ref 0–99)
NonHDL: 145.34
Total CHOL/HDL Ratio: 4
Triglycerides: 129 mg/dL (ref 0.0–149.0)
VLDL: 25.8 mg/dL (ref 0.0–40.0)

## 2023-03-29 LAB — COMPREHENSIVE METABOLIC PANEL
ALT: 15 U/L (ref 0–35)
AST: 20 U/L (ref 0–37)
Albumin: 4.2 g/dL (ref 3.5–5.2)
Alkaline Phosphatase: 53 U/L (ref 39–117)
BUN: 15 mg/dL (ref 6–23)
CO2: 25 mEq/L (ref 19–32)
Calcium: 9.6 mg/dL (ref 8.4–10.5)
Chloride: 104 mEq/L (ref 96–112)
Creatinine, Ser: 1 mg/dL (ref 0.40–1.20)
GFR: 68.18 mL/min (ref >60.00)
Glucose, Bld: 95 mg/dL (ref 70–99)
Potassium: 4.5 mEq/L (ref 3.5–5.1)
Sodium: 137 mEq/L (ref 135–145)
Total Bilirubin: 0.3 mg/dL (ref 0.2–1.2)
Total Protein: 7.2 g/dL (ref 6.0–8.3)

## 2023-03-29 LAB — HEPATITIS B SURFACE ANTIGEN: Hepatitis B Surface Ag: NONREACTIVE

## 2023-03-29 LAB — VITAMIN D 25 HYDROXY (VIT D DEFICIENCY, FRACTURES): VITD: 48.99 ng/mL (ref 30.00–100.00)

## 2023-03-29 LAB — CBC
HCT: 39.9 % (ref 36.0–46.0)
Hemoglobin: 13.2 g/dL (ref 12.0–15.0)
MCHC: 32.9 g/dL (ref 30.0–36.0)
MCV: 93.3 fl (ref 78.0–100.0)
Platelets: 325 10*3/uL (ref 150.0–400.0)
RBC: 4.28 Mil/uL (ref 3.87–5.11)
RDW: 13.2 % (ref 11.5–15.5)
WBC: 7.1 10*3/uL (ref 4.0–10.5)

## 2023-03-29 LAB — URINE CULTURE
MICRO NUMBER:: 15502315
Result:: NO GROWTH
SPECIMEN QUALITY:: ADEQUATE

## 2023-03-29 LAB — TSH: TSH: 0.84 u[IU]/mL (ref 0.35–5.50)

## 2023-03-29 LAB — HEPATITIS C ANTIBODY: Hepatitis C Ab: NONREACTIVE

## 2023-03-29 LAB — HIV ANTIBODY (ROUTINE TESTING W REFLEX): HIV 1&2 Ab, 4th Generation: NONREACTIVE

## 2023-03-29 LAB — HSV(HERPES SIMPLEX VRS) I + II AB-IGG
HAV 1 IGG,TYPE SPECIFIC AB: <0.9 index
HSV 2 IGG,TYPE SPECIFIC AB: <0.9 index

## 2023-03-29 LAB — RPR: RPR Ser Ql: NONREACTIVE

## 2023-03-29 LAB — HEMOGLOBIN A1C: Hgb A1c MFr Bld: 5.3 % (ref 4.6–6.5)

## 2023-03-30 ENCOUNTER — Encounter: Payer: Self-pay | Admitting: Family Medicine

## 2023-03-30 LAB — CERVICOVAGINAL ANCILLARY ONLY
Bacterial Vaginitis (gardnerella): NEGATIVE
Candida Glabrata: NEGATIVE
Candida Vaginitis: NEGATIVE
Chlamydia: NEGATIVE
Comment: NEGATIVE
Comment: NEGATIVE
Comment: NEGATIVE
Comment: NEGATIVE
Comment: NEGATIVE
Comment: NORMAL
Neisseria Gonorrhea: NEGATIVE
Trichomonas: NEGATIVE

## 2023-03-31 ENCOUNTER — Ambulatory Visit: Payer: 59 | Admitting: Family Medicine

## 2023-04-04 ENCOUNTER — Ambulatory Visit: Payer: 59 | Admitting: Family

## 2023-04-05 ENCOUNTER — Encounter: Payer: Self-pay | Admitting: Family Medicine

## 2023-04-08 ENCOUNTER — Encounter: Payer: Self-pay | Admitting: Family Medicine

## 2023-04-11 ENCOUNTER — Ambulatory Visit: Payer: 59 | Admitting: Orthopaedic Surgery

## 2023-04-14 ENCOUNTER — Ambulatory Visit: Payer: 59 | Admitting: Orthopaedic Surgery

## 2023-04-15 ENCOUNTER — Encounter: Payer: Self-pay | Admitting: Family Medicine

## 2023-04-19 NOTE — Progress Notes (Unsigned)
[facility] at [facility] [address], [address] [place], [place] [address] [phone]  Date:  04/21/2023   Name:  Maria Hernandez   DOB:  [birth year]   MRN:  [id number]  PCP:  Sandford Craze, NP    Chief Complaint: No chief complaint on file.   History of Present Illness:  Maria Hernandez is a 48 y.o. very pleasant female patient who presents with the following:  Patient seen today for concern of STI screening I saw her most recently in September and did STI screening at that time  However, patient was concerned not enough time for the past since unprotected intercourse and she would like to repeat testing  Patient Active Problem List   Diagnosis Date Noted   Screening examination for STD (sexually transmitted disease) 02/08/2023   Preventative health care 03/30/2022   Knee pain, bilateral 07/16/2019   Chronic right-sided low back pain without sciatica 05/09/2019   Migraines 06/07/2017   Hearing loss 06/07/2017    Past Medical History:  Diagnosis Date   Migraines     Past Surgical History:  Procedure Laterality Date   AUGMENTATION MAMMAPLASTY Bilateral    BREAST ENHANCEMENT SURGERY Bilateral 2005   INNER EAR SURGERY Left    Reports 2 prior ear surgeries for prosthesis. 3rd surgery coming up in January 2019   MANDIBLE OSTEOTOMY  1995    Social History   Tobacco Use   Smoking status: Never   Smokeless tobacco: Never  Vaping Use   Vaping status: Never Used  Substance Use Topics   Alcohol use: Yes    Comment: occasional wine "1- 2 per month"   Drug use: Never    Family History  Problem Relation Age of Onset   Hypothyroidism Mother        died from covid   Hypertension Father    Diabetes type II Father        BKA   Arthritis Brother    Retinal detachment Brother    Breast cancer Maternal Grandmother     Allergies  Allergen Reactions   Verapamil Hives   Macrobid [Nitrofurantoin Macrocrystal]     Light  headed   Metronidazole Other (See Comments)    Lightheadedness    Medication list has been reviewed and updated.  Current Outpatient Medications on File Prior to Visit  Medication Sig Dispense Refill   Multiple Vitamin (MULTIVITAMIN) tablet Take 1 tablet by mouth daily.     SUMAtriptan (IMITREX) 100 MG tablet Take 1 tab once daily.  May repeat in 2 hours if headache persists or recurs.  (Max of 2 tabs in 24 hours) 10 tablet 3   No current facility-administered medications on file prior to visit.    Review of Systems:  As per HPI- otherwise negative.   Physical Examination: There were no vitals filed for this visit. There were no vitals filed for this visit. There is no height or weight on file to calculate BMI. Ideal Body Weight:    GEN: no acute distress. HEENT: Atraumatic, Normocephalic.  Ears and Nose: No external deformity. CV: RRR, No M/G/R. No JVD. No thrill. No extra heart sounds. PULM: CTA B, no wheezes, crackles, rhonchi. No retractions. No resp. distress. No accessory muscle use. ABD: S, NT, ND, +BS. No rebound. No HSM. EXTR: No c/c/e PSYCH: Normally interactive. Conversant.    Assessment and Plan: ***  Signed Abbe Amsterdam, MD

## 2023-04-21 ENCOUNTER — Other Ambulatory Visit (HOSPITAL_COMMUNITY)
Admission: RE | Admit: 2023-04-21 | Discharge: 2023-04-21 | Disposition: A | Discharge status: Home / Self Care | Payer: 59 | Source: Ambulatory Visit | Attending: Family | Admitting: Family

## 2023-04-21 ENCOUNTER — Ambulatory Visit: Payer: 59 | Admitting: Family Medicine

## 2023-04-21 VITALS — BP 118/80 | HR 68 | Temp 97.9°F | Resp 18 | Ht 67.0 in | Wt 148.8 lb

## 2023-04-21 DIAGNOSIS — Z113 Encounter for screening for infections with a predominantly sexual mode of transmission: Secondary | ICD-10-CM

## 2023-04-22 LAB — HEPATITIS C ANTIBODY: Hepatitis C Ab: NONREACTIVE

## 2023-04-22 LAB — RPR: RPR Ser Ql: NONREACTIVE

## 2023-04-22 LAB — HEPATITIS B SURFACE ANTIGEN: Hepatitis B Surface Ag: NONREACTIVE

## 2023-04-22 LAB — HIV ANTIBODY (ROUTINE TESTING W REFLEX): HIV 1&2 Ab, 4th Generation: NONREACTIVE

## 2023-04-23 ENCOUNTER — Encounter: Payer: Self-pay | Admitting: Family Medicine

## 2023-04-25 ENCOUNTER — Ambulatory Visit (HOSPITAL_BASED_OUTPATIENT_CLINIC_OR_DEPARTMENT_OTHER)
Admission: RE | Admit: 2023-04-25 | Discharge: 2023-04-25 | Disposition: A | Discharge status: Home / Self Care | Payer: 59 | Source: Ambulatory Visit | Attending: Family | Admitting: Family

## 2023-04-25 ENCOUNTER — Encounter: Payer: Self-pay | Admitting: Family Medicine

## 2023-04-25 ENCOUNTER — Encounter (HOSPITAL_BASED_OUTPATIENT_CLINIC_OR_DEPARTMENT_OTHER): Payer: Self-pay

## 2023-04-25 DIAGNOSIS — Z1231 Encounter for screening mammogram for malignant neoplasm of breast: Secondary | ICD-10-CM | POA: Insufficient documentation

## 2023-04-25 LAB — CERVICOVAGINAL ANCILLARY ONLY
Chlamydia: NEGATIVE
Comment: NEGATIVE
Comment: NEGATIVE
Comment: NORMAL
Neisseria Gonorrhea: NEGATIVE
Trichomonas: NEGATIVE

## 2023-05-02 ENCOUNTER — Ambulatory Visit: Payer: 59 | Admitting: Obstetrics & Gynecology

## 2023-05-02 ENCOUNTER — Encounter: Payer: Self-pay | Admitting: Obstetrics & Gynecology

## 2023-05-02 VITALS — BP 111/69 | HR 60 | Ht 67.0 in | Wt 160.0 lb

## 2023-05-02 DIAGNOSIS — Z30011 Encounter for initial prescription of contraceptive pills: Secondary | ICD-10-CM | POA: Diagnosis not present

## 2023-05-02 MED ORDER — SLYND 4 MG PO TABS: 1.0000 | ORAL_TABLET | Freq: Every day | ORAL | 11 refills | Status: DC

## 2023-05-02 NOTE — Progress Notes (Signed)
Reviewed all forms of birth control options available including abstinence; fertility period awareness methods; over the counter/barrier methods; hormonal contraceptive medication including pill, patch, ring, injection,contraceptive implant; hormonal and nonhormonal IUDs; permanent sterilization options including vasectomy and the various tubal sterilization modalities. Risks and benefits reviewed.  Questions were answered.  Information was given to patient to review.  She has used LNG IUD and combination OCP in the past and had side effects she wants to avoid. She is willing to try progestin only OCP.   Meds ordered this encounter  Medications   Drospirenone (SLYND) 4 MG TABS    Sig: Take 1 tablet (4 mg total) by mouth daily.    Dispense:  28 tablet    Refill:  11   RTC 6 months  .10 minutes  Adam Phenix, MD

## 2023-05-04 ENCOUNTER — Encounter: Payer: Self-pay | Admitting: Family

## 2023-05-11 ENCOUNTER — Other Ambulatory Visit: Payer: Self-pay | Admitting: Family

## 2023-05-22 ENCOUNTER — Encounter: Payer: Self-pay | Admitting: Family

## 2023-06-06 ENCOUNTER — Encounter: Payer: Self-pay | Admitting: Obstetrics & Gynecology

## 2023-06-07 ENCOUNTER — Encounter: Payer: Self-pay | Admitting: Obstetrics & Gynecology

## 2023-06-08 ENCOUNTER — Encounter: Payer: Self-pay | Admitting: Family

## 2023-06-10 ENCOUNTER — Telehealth: Payer: Self-pay | Admitting: Family Medicine

## 2023-06-10 MED ORDER — DROSPIRENONE-ETHINYL ESTRADIOL 3-0.02 MG PO TABS: 1.0000 | ORAL_TABLET | Freq: Every day | ORAL | 11 refills | Status: DC

## 2023-06-10 NOTE — Telephone Encounter (Signed)
-----   Message from [facility] Tyhee W sent at 06/08/2023  3:05 PM EST ----- Regarding: FW: Birth Control This patient is taking Slynd. She has been spotting and passing clots while taking it. Please advise. ----- Message ----- From: Brandt Loosen Sent: 06/08/2023  10:28 AM EST To: [facility] Subject: Birth Control                                  Patient called and said she had questions about her birth control. She is still bleeding and passing clots. She saw Dr. Debroah Loop and the messages just keep getting routed back and forth. I explained to her that he fills in at our office and I would reach out to our clinical team to see if we can get her questions answered.   I wanted to send a new message with her problems to get routed to a consistent [place] provider. Here is her last message for reference:   How long do you spot after taking Slynd? I have been spotting non stop for a month and a half now. And I just had a mini period a few weeks ago and now the spotting  Seems heavy again almost like I'm trying to have another period.  I know there is breakthrough spotting when u take it but I have done it every day for a month and a half. Is becoming annoying lol.  She said we are okay to reach out to her via MyChart or we could call her at [phone].

## 2023-06-10 NOTE — Telephone Encounter (Signed)
Called patient - has been on slynd for about 6 weeks and has been having bleeding consistently since starting it. Stopped it this past week and bleeding is stopping. Has been on ortho tricyclin, lo estrin and a few other COCs. Had SE of hair falling out.  Is having some hot flashes and mood lability in addition to a late cycle. Not currently in a relationship, but this may change.  Options given: Go on HRT, which would help with perimenopausal symptoms but not provide coverage for contraception. Try different COC. Could try Yaz first as this would be drosperinon and estrogen together. If has side effect with this, could try Nextstellis. Go back on POP and add HRT.  As this point, she would like to try Yaz. Will follow closely for SE.

## 2023-06-27 ENCOUNTER — Encounter: Payer: Self-pay | Admitting: Obstetrics & Gynecology

## 2023-07-04 MED ORDER — PROGESTERONE MICRONIZED 100 MG PO CAPS
100.0000 mg | ORAL_CAPSULE | Freq: Every day | ORAL | 3 refills | Status: DC
  Filled 2023-10-13: qty 90, 90d supply, fill #0
  Filled 2024-01-07: qty 90, 90d supply, fill #1

## 2023-07-04 NOTE — Addendum Note (Signed)
Addended by: Levie Heritage on: 07/04/2023 01:33 PM   Modules accepted: Orders

## 2023-07-04 NOTE — Telephone Encounter (Signed)
Talked with patient - she decided to stop COC. Would like to start progesterone for HRT due to perimenopause. Discussed to take it at night and only when not having period.

## 2023-07-10 NOTE — Progress Notes (Signed)
 [facility] at [facility] [address], [address] [place], [place] [address] [phone]  Date:  07/11/2023   Hernandez:  Maria Hernandez   DOB:  01/02/1976   MRN:  [id number]  PCP:  Daryl Setter, NP    Chief Complaint: tonsil irritation (Pt says she feels like her tonsils are inflamed and are causing some airway obstruction. No URI sxs.  Sxs x 1 week. )   History of Present Illness:  Maria Hernandez is a 48 y.o. very pleasant female patient who presents with the following:  Pt seen today with concern of enlarged tonsils-however on further discussion she is actually talking that her uvula Last seen by myself in October for routine STI screening  She has felt just fine except she feels like her uvula has been enlarged/lock within normal for about a week It feels a bit unusual, or like there is a foreign body in her throat However she has not had a ST or any symptoms of illness  She notes she does have a deviated septum and was told she might need a septoplasty years ago-she broke her nose as a child.  She does have an ENT doctor  No difficulty swallowing or pain with swallowing, but she will feel like something feels swollen when she swallows She may feel like she has to reposition her head and neck to breathe esp when she is supine She does not snore as far as she knows  She tried some flonase- started yesterday   No hives, no angioedema or other signs of allergic reaction, no respiratory distress     Patient Active Problem List   Diagnosis Date Noted   Screening examination for STD (sexually transmitted disease) 02/08/2023   Preventative health care 03/30/2022   Knee pain, bilateral 07/16/2019   Chronic right-sided low back pain without sciatica 05/09/2019   Migraines 06/07/2017   Hearing loss 06/07/2017    Past Medical History:  Diagnosis Date   Migraines     Past Surgical History:  Procedure Laterality Date    AUGMENTATION MAMMAPLASTY Bilateral    BREAST ENHANCEMENT SURGERY Bilateral 2005   INNER EAR SURGERY Left    Reports 2 prior ear surgeries for prosthesis. 3rd surgery coming up in January 2019   MANDIBLE OSTEOTOMY  1995    Social History   Tobacco Use   Smoking status: Never   Smokeless tobacco: Never  Vaping Use   Vaping status: Never Used  Substance Use Topics   Alcohol use: Yes    Comment: occasional wine 1- 2 per month   Drug use: Never    Family History  Problem Relation Age of Onset   Hypothyroidism Mother        died from covid   Hypertension Father    Diabetes type II Father        BKA   Arthritis Brother    Retinal detachment Brother    Breast cancer Maternal Grandmother     Allergies  Allergen Reactions   Verapamil Hives   Macrobid  [Nitrofurantoin  Macrocrystal]     Light headed   Metronidazole  Other (See Comments)    Lightheadedness    Medication list has been reviewed and updated.  Current Outpatient Medications on File Prior to Visit  Medication Sig Dispense Refill   Multiple Vitamin (MULTIVITAMIN) tablet Take 1 tablet by mouth daily.     progesterone  (PROMETRIUM ) 100 MG capsule Take 1 capsule (100 mg total) by mouth  at bedtime. 90 capsule 3   SUMAtriptan  (IMITREX ) 100 MG tablet TAKE 1 TABLET BY MOUTH ONCE DAILY. MAY REPEAT IN 2 HOURS IF HEADACHE PERSISTS OR RECURS. MAX OF 2 TABS IN 24 HOURS 10 tablet 0   No current facility-administered medications on file prior to visit.    Review of Systems:  As per HPI- otherwise negative.   Physical Examination: Vitals:   07/11/23 1026  BP: 122/60  Pulse: 64  Resp: 18  Temp: 98.2 F (36.8 C)  SpO2: 99%   Vitals:   07/11/23 1026  Weight: 164 lb 3.2 oz (74.5 kg)  Height: 5' 7.2 (1.707 m)   Body mass index is 25.56 kg/m. Ideal Body Weight: Weight in (lb) to have BMI = 25: 160.2  GEN: no acute distress.  Normal weight, looks well HEENT: Atraumatic, Normocephalic.   PEERL, EOMI, TM wnl  bilaterally Ears and Nose: No external deformity. CV: RRR, No M/G/R. No JVD. No thrill. No extra heart sounds. PULM: CTA B, no wheezes, crackles, rhonchi. No retractions. No resp. distress. No accessory muscle use. EXTR: No c/c/e PSYCH: Normally interactive. Conversant.  Her nose shows evidence of previous fracture, she does have a severe deviated septum.  On oropharyngeal exam her uvula is perhaps slightly long, but does not appear inflamed, red, or swollen.  She does have a relatively small lower jaw which may cause the uvula to touch the back of her tongue depending on her position I am not able to determine if her uvula has changed from previous on my exam-it appears within normal limits  Assessment and Plan: Foreign body sensation, throat  Patient seen today with a feeling that her uvula is longer than usual, like it is bothering the back of her throat and touching her tongue.  On exam her uvula does not appear abnormal, but certainly may have changed with time and be causing the symptoms.  She also does have a known history of significant deviated septum which may be contributing.  I encouraged her to follow-up with her ENT doctor to discuss further options and she plans to do so.  She will let me know if any other concerns in the meantime  Signed Harlene Schroeder, MD

## 2023-07-11 ENCOUNTER — Encounter: Payer: Self-pay | Admitting: Family Medicine

## 2023-07-11 ENCOUNTER — Ambulatory Visit: Payer: 59 | Admitting: Family Medicine

## 2023-07-11 VITALS — BP 122/60 | HR 64 | Temp 98.2°F | Resp 18 | Ht 67.2 in | Wt 164.2 lb

## 2023-07-11 DIAGNOSIS — R09A2 Foreign body sensation, throat: Secondary | ICD-10-CM | POA: Diagnosis not present

## 2023-07-18 ENCOUNTER — Other Ambulatory Visit: Payer: Self-pay | Admitting: Family

## 2023-07-25 ENCOUNTER — Other Ambulatory Visit: Payer: Self-pay | Admitting: *Deleted

## 2023-07-25 ENCOUNTER — Encounter: Payer: Self-pay | Admitting: Family

## 2023-07-25 MED ORDER — SUMATRIPTAN SUCCINATE 100 MG PO TABS: ORAL_TABLET | ORAL | 0 refills | Status: DC

## 2023-07-27 NOTE — Progress Notes (Deleted)
 [facility] [address] [place], [place] [address] [phone]  Date:  07/28/2023   Name:  Maria Hernandez   DOB:  1976-01-13   MRN:  [id number]  PCP:  Sandford Craze, NP    Chief Complaint: No chief complaint on file.   History of Present Illness:  Maria Hernandez is a 48 y.o. very pleasant female patient who presents with the following:  Patient seen today for STI screening and Pap smear Most recent visit with myself was earlier this month for concern of a foreign body sensation in her throat Most recent Pap 3/22, negative. We did STI screening fairly recently Patient Active Problem List   Diagnosis Date Noted   Screening examination for STD (sexually transmitted disease) 02/08/2023   Preventative health care 03/30/2022   Knee pain, bilateral 07/16/2019   Chronic right-sided low back pain without sciatica 05/09/2019   Migraines 06/07/2017   Hearing loss 06/07/2017    Past Medical History:  Diagnosis Date   Migraines     Past Surgical History:  Procedure Laterality Date   AUGMENTATION MAMMAPLASTY Bilateral    BREAST ENHANCEMENT SURGERY Bilateral 2005   INNER EAR SURGERY Left    Reports 2 prior ear surgeries for prosthesis. 3rd surgery coming up in January 2019   MANDIBLE OSTEOTOMY  1995    Social History   Tobacco Use   Smoking status: Never   Smokeless tobacco: Never  Vaping Use   Vaping status: Never Used  Substance Use Topics   Alcohol use: Yes    Comment: occasional wine "1- 2 per month"   Drug use: Never    Family History  Problem Relation Age of Onset   Hypothyroidism Mother        died from covid   Hypertension Father    Diabetes type II Father        BKA   Arthritis Brother    Retinal detachment Brother    Breast cancer Maternal Grandmother     Allergies  Allergen Reactions   Verapamil Hives   Macrobid [Nitrofurantoin Macrocrystal]     Light headed   Metronidazole  Other (See Comments)    Lightheadedness    Medication list has been reviewed and updated.  Current Outpatient Medications on File Prior to Visit  Medication Sig Dispense Refill   Multiple Vitamin (MULTIVITAMIN) tablet Take 1 tablet by mouth daily.     progesterone (PROMETRIUM) 100 MG capsule Take 1 capsule (100 mg total) by mouth at bedtime. 90 capsule 3   SUMAtriptan (IMITREX) 100 MG tablet TAKE 1 TABLET BY MOUTH ONCE DAILY. MAY REPEAT IN 2 HOURS IF HEADACHE PERSISTS OR RECURS. MAX OF 2 TABS IN 24 HOURS 10 tablet 0   No current facility-administered medications on file prior to visit.    Review of Systems:  As per HPI- otherwise negative.   Physical Examination: There were no vitals filed for this visit. There were no vitals filed for this visit. There is no height or weight on file to calculate BMI. Ideal Body Weight:    GEN: no acute distress. HEENT: Atraumatic, Normocephalic.  Ears and Nose: No external deformity. CV: RRR, No M/G/R. No JVD. No thrill. No extra heart sounds. PULM: CTA B, no wheezes, crackles, rhonchi. No retractions. No resp. distress. No accessory muscle use. ABD: S, NT, ND, +BS. No rebound. No HSM. EXTR: No c/c/e PSYCH: Normally interactive. Conversant.    Assessment and Plan: ***  Signed Abbe Amsterdam, MD

## 2023-07-28 ENCOUNTER — Ambulatory Visit: Payer: 59 | Admitting: Family Medicine

## 2023-08-01 ENCOUNTER — Other Ambulatory Visit (HOSPITAL_COMMUNITY)
Admission: RE | Admit: 2023-08-01 | Discharge: 2023-08-01 | Disposition: A | Discharge status: Home / Self Care | Payer: 59 | Source: Ambulatory Visit | Attending: Family | Admitting: Family

## 2023-08-01 ENCOUNTER — Ambulatory Visit: Payer: 59 | Admitting: Family

## 2023-08-01 ENCOUNTER — Ambulatory Visit: Payer: 59 | Admitting: Family Medicine

## 2023-08-01 VITALS — BP 109/89 | HR 74 | Temp 98.5°F | Resp 16 | Ht 67.2 in | Wt 163.0 lb

## 2023-08-01 DIAGNOSIS — N926 Irregular menstruation, unspecified: Secondary | ICD-10-CM | POA: Insufficient documentation

## 2023-08-01 DIAGNOSIS — Z1211 Encounter for screening for malignant neoplasm of colon: Secondary | ICD-10-CM | POA: Insufficient documentation

## 2023-08-01 DIAGNOSIS — Z113 Encounter for screening for infections with a predominantly sexual mode of transmission: Secondary | ICD-10-CM | POA: Insufficient documentation

## 2023-08-01 NOTE — Progress Notes (Signed)
Subjective:     Patient ID: Maria Hernandez, female    DOB: 11-03-1975, 48 y.o.   MRN: [id number]  Chief Complaint  Patient presents with   STD screening    Will like to get std screening    HPI  Discussed the use of AI scribe software for clinical note transcription with the patient, who gave verbal consent to proceed.  History of Present Illness   The patient presents today for STI screening. She reports that she has experiencing irregular menstrual cycles, with periods being missed for up to 14 days, and suspects she may be perimenopausal. She has tried different birth control methods to manage these symptoms, but has experienced prolonged spotting with each, leading to discontinuation. An IUD was also considered, but the patient declined due to concerns about prolonged bleeding and previous uncomfortable experiences with an IUD. She is working with her GYN on these issues.  The patient also reports a "stretched uvula" that is causing discomfort and has been referred to an ENT for further evaluation. She initially presented for a Pap smear, believing it was due, but it was clarified that her next Pap smear is due in 2027. The patient requested STD screening, including HIV and Hepatitis C, despite no reported symptoms.          Health Maintenance Due  Topic Date Due   Colonoscopy  Never done    Past Medical History:  Diagnosis Date   Migraines     Past Surgical History:  Procedure Laterality Date   AUGMENTATION MAMMAPLASTY Bilateral    BREAST ENHANCEMENT SURGERY Bilateral 2005   INNER EAR SURGERY Left    Reports 2 prior ear surgeries for prosthesis. 3rd surgery coming up in January 2019   MANDIBLE OSTEOTOMY  1995    Family History  Problem Relation Age of Onset   Hypothyroidism Mother        died from covid   Hypertension Father    Diabetes type II Father        BKA   Arthritis Brother    Retinal detachment Brother    Breast cancer Maternal Grandmother      Social History   Socioeconomic History   Marital status: Single    Spouse name: Not on file   Number of children: Not on file   Years of education: Not on file   Highest education level: Bachelor's degree (e.g., BA, AB, BS)  Occupational History   Not on file  Tobacco Use   Smoking status: Never   Smokeless tobacco: Never  Vaping Use   Vaping status: Never Used  Substance and Sexual Activity   Alcohol use: Yes    Comment: occasional wine "1- 2 per month"   Drug use: Never   Sexual activity: Not Currently    Birth control/protection: None, Condom  Other Topics Concern   Not on file  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   Enjoys movies, shopping, relaxing   Does 3 12 hour shifts   Single   No children   No pets   Social Drivers of Corporate investment banker Strain: Low Risk  (08/01/2023)   Overall Financial Resource Strain (CARDIA)    Difficulty of Paying Living Expenses: Not hard at all  Food Insecurity: Unknown (08/01/2023)   Hunger Vital Sign    Worried About Running Out of Food in the Last Year: Never true    Ran Out of  Food in the Last Year: Patient declined  Transportation Needs: Patient Declined (08/01/2023)   PRAPARE - Administrator, Civil Service (Medical): Patient declined    Lack of Transportation (Non-Medical): Patient declined  Physical Activity: Sufficiently Active (08/01/2023)   Exercise Vital Sign    Days of Exercise per Week: 4 days    Minutes of Exercise per Session: 40 min  Stress: No Stress Concern Present (08/01/2023)   [employer] of Occupational Health - Occupational Stress Questionnaire    Feeling of Stress : Not at all  Social Connections: Moderately Isolated (08/01/2023)   Social Connection and Isolation Panel [NHANES]    Frequency of Communication with Friends and Family: More than three times a week    Frequency of Social Gatherings with Friends and Family: Three times a week     Attends Religious Services: More than 4 times per year    Active Member of Clubs or Organizations: No    Attends Engineer, structural: Not on file    Marital Status: Never married  Intimate Partner Violence: Not on file    Outpatient Medications Prior to Visit  Medication Sig Dispense Refill   Multiple Vitamin (MULTIVITAMIN) tablet Take 1 tablet by mouth daily.     progesterone (PROMETRIUM) 100 MG capsule Take 1 capsule (100 mg total) by mouth at bedtime. 90 capsule 3   SUMAtriptan (IMITREX) 100 MG tablet TAKE 1 TABLET BY MOUTH ONCE DAILY. MAY REPEAT IN 2 HOURS IF HEADACHE PERSISTS OR RECURS. MAX OF 2 TABS IN 24 HOURS 10 tablet 0   No facility-administered medications prior to visit.    Allergies  Allergen Reactions   Verapamil Hives   Macrobid [Nitrofurantoin Macrocrystal]     Light headed   Metronidazole Other (See Comments)    Lightheadedness    ROS See HPI    Objective:    Physical Exam Constitutional:      General: She is not in acute distress.    Appearance: Normal appearance. She is well-developed.  HENT:     Head: Normocephalic and atraumatic.     Right Ear: External ear normal.     Left Ear: External ear normal.  Eyes:     General: No scleral icterus. Neck:     Thyroid: No thyromegaly.  Cardiovascular:     Rate and Rhythm: Normal rate and regular rhythm.     Heart sounds: Normal heart sounds. No murmur heard. Pulmonary:     Effort: Pulmonary effort is normal. No respiratory distress.     Breath sounds: Normal breath sounds. No wheezing.  Genitourinary:    General: Normal vulva.  Musculoskeletal:     Cervical back: Neck supple.  Skin:    General: Skin is warm and dry.  Neurological:     Mental Status: She is alert and oriented to person, place, and time.  Psychiatric:        Mood and Affect: Mood normal.        Behavior: Behavior normal.        Thought Content: Thought content normal.        Judgment: Judgment normal.      BP 109/89  (BP Location: Right Arm, Patient Position: Sitting, Cuff Size: Normal)   Pulse 74   Temp 98.5 F (36.9 C) (Oral)   Resp 16   Ht 5' 7.2" (1.707 m)   Wt 163 lb (73.9 kg)   SpO2 98%   BMI 25.38 kg/m  Wt Readings from Last 3 Encounters:  08/01/23 163 lb (73.9 kg)  07/11/23 164 lb 3.2 oz (74.5 kg)  05/02/23 160 lb (72.6 kg)       Assessment & Plan:   Problem List Items Addressed This Visit       Unprioritized   Screening for colon cancer - Primary    Patient turned 47 this year, which is the recommended age to start colon cancer screening. -Refer to gastroenterology for colonoscopy.      Relevant Orders   Ambulatory referral to Gastroenterology   Routine screening for STI (sexually transmitted infection)   Relevant Orders   HIV antibody (with reflex)   RPR   HSV 2 antibody, IgG   Cervicovaginal ancillary only( [place])   Hepatitis C Antibody   Irregular menstrual bleeding    Reports irregular periods and spotting. Tried multiple birth control pills without success due to spotting and hair loss. Declined IUD due to potential for prolonged bleeding and discomfort. -No changes to current management. Management per GYN.        I am having Maria Hernandez maintain her multivitamin, progesterone, and SUMAtriptan.  No orders of the defined types were placed in this encounter.

## 2023-08-01 NOTE — Assessment & Plan Note (Signed)
  Patient turned 47 this year, which is the recommended age to start colon cancer screening. -Refer to gastroenterology for colonoscopy.

## 2023-08-01 NOTE — Assessment & Plan Note (Signed)
  Reports irregular periods and spotting. Tried multiple birth control pills without success due to spotting and hair loss. Declined IUD due to potential for prolonged bleeding and discomfort. -No changes to current management. Management per GYN.

## 2023-08-01 NOTE — Patient Instructions (Signed)
VISIT SUMMARY:  During today's visit, we discussed several health concerns and preventive measures. You are due for colon cancer screening, and we have referred you for a colonoscopy. We also talked about your irregular menstrual cycles, which may be related to perimenopause. Additionally, you requested and we ordered routine STD screening. Lastly, we confirmed that your next Pap smear is not due until 2027.  YOUR PLAN:  -COLON CANCER SCREENING: Colon cancer screening is recommended starting at age 19 to detect early signs of colon cancer. You have been referred to a gastroenterologist for a colonoscopy, which is a procedure to examine the inside of your colon and rectum.  -PERIMENOPAUSE: Perimenopause is the transition period before menopause when hormonal changes can cause irregular menstrual cycles. You have experienced irregular periods and spotting, and have tried different birth control methods without success. No changes to your current management were made today.  -SEXUALLY TRANSMITTED DISEASE SCREENING: You requested routine screening for sexually transmitted diseases. We have ordered tests for HIV, Hepatitis C, Syphilis, and Herpes to ensure your health and peace of mind.  -CERVICAL CANCER SCREENING: Cervical cancer screening helps detect early changes in the cervix that could lead to cancer. Your last Pap smear and HPV test were normal, and your next Pap smear is due in March 2027.  INSTRUCTIONS:  Please follow up with the gastroenterologist for your colonoscopy appointment. Additionally, you will be contacted with the results of your STD screening once they are available.

## 2023-08-02 LAB — CERVICOVAGINAL ANCILLARY ONLY
Chlamydia: NEGATIVE
Comment: NEGATIVE
Comment: NEGATIVE
Comment: NORMAL
Neisseria Gonorrhea: NEGATIVE
Trichomonas: NEGATIVE

## 2023-08-02 LAB — RPR: RPR Ser Ql: NONREACTIVE

## 2023-08-02 LAB — HIV ANTIBODY (ROUTINE TESTING W REFLEX): HIV 1&2 Ab, 4th Generation: NONREACTIVE

## 2023-08-02 LAB — HSV 2 ANTIBODY, IGG: HSV 2 Glycoprotein G Ab, IgG: <0.9 {index}

## 2023-08-02 LAB — HEPATITIS C ANTIBODY: Hepatitis C Ab: NONREACTIVE

## 2023-08-03 ENCOUNTER — Encounter: Payer: Self-pay | Admitting: Family

## 2023-08-08 ENCOUNTER — Encounter (INDEPENDENT_AMBULATORY_CARE_PROVIDER_SITE_OTHER): Payer: Self-pay | Admitting: Otolaryngology

## 2023-08-08 ENCOUNTER — Ambulatory Visit (INDEPENDENT_AMBULATORY_CARE_PROVIDER_SITE_OTHER): Payer: 59 | Admitting: Otolaryngology

## 2023-08-08 VITALS — BP 142/94 | HR 75

## 2023-08-08 DIAGNOSIS — J342 Deviated nasal septum: Secondary | ICD-10-CM

## 2023-08-08 DIAGNOSIS — R0981 Nasal congestion: Secondary | ICD-10-CM | POA: Diagnosis not present

## 2023-08-08 DIAGNOSIS — R490 Dysphonia: Secondary | ICD-10-CM | POA: Diagnosis not present

## 2023-08-08 DIAGNOSIS — R070 Pain in throat: Secondary | ICD-10-CM | POA: Diagnosis not present

## 2023-08-08 DIAGNOSIS — J343 Hypertrophy of nasal turbinates: Secondary | ICD-10-CM

## 2023-08-08 DIAGNOSIS — R09A2 Foreign body sensation, throat: Secondary | ICD-10-CM | POA: Diagnosis not present

## 2023-08-08 DIAGNOSIS — K219 Gastro-esophageal reflux disease without esophagitis: Secondary | ICD-10-CM | POA: Diagnosis not present

## 2023-08-08 MED ORDER — CETIRIZINE HCL 10 MG PO TABS: 10.0000 mg | ORAL_TABLET | Freq: Every day | ORAL | 11 refills | Status: DC

## 2023-08-08 MED ORDER — FAMOTIDINE 20 MG PO TABS: 20.0000 mg | ORAL_TABLET | Freq: Two times a day (BID) | ORAL | 1 refills | Status: DC

## 2023-08-08 NOTE — Progress Notes (Unsigned)
ENT CONSULT:  Reason for Consult: foreign body sensation    HPI: Maria Hernandez is an 48 y.o. female with hx   Records Reviewed:  PCP office visit 07/11/23  Chief Complaint: tonsil irritation (Pt says she feels like her tonsils are inflamed and are causing some airway obstruction. No URI sxs.  Sxs x 1 week. )     History of Present Illness:   Maria Hernandez is a 48 y.o. very pleasant female patient who presents with the following:   Pt seen today with concern of enlarged tonsils-however on further discussion she is actually talking that her uvula Last seen by myself in October for routine STI screening  She has felt just fine except she feels like her uvula has been enlarged/lock within normal for about a week It feels a bit unusual, or like there is a foreign body in her throat However she has not had a ST or any symptoms of illness   She notes she does have a deviated septum and was told she might need a septoplasty years ago-she broke her nose as a child.  She does have an ENT doctor   No difficulty swallowing or pain with swallowing, but she will feel like something feels swollen when she swallows She may feel like she has to reposition her head and neck to breathe esp when she is supine She does not snore as far as she knows   She tried some flonase- started yesterday    No hives, no angioedema or other signs of allergic reaction, no respiratory distress    Past Medical History:  Diagnosis Date   Migraines     Past Surgical History:  Procedure Laterality Date   AUGMENTATION MAMMAPLASTY Bilateral    BREAST ENHANCEMENT SURGERY Bilateral 2005   INNER EAR SURGERY Left    Reports 2 prior ear surgeries for prosthesis. 3rd surgery coming up in January 2019   MANDIBLE OSTEOTOMY  1995    Family History  Problem Relation Age of Onset   Hypothyroidism Mother        died from covid   Hypertension Father    Diabetes type II Father        BKA   Arthritis Brother     Retinal detachment Brother    Breast cancer Maternal Grandmother     Social History:  reports that she has never smoked. She has never used smokeless tobacco. She reports current alcohol use. She reports that she does not use drugs.  Allergies:  Allergies  Allergen Reactions   Verapamil Hives   Macrobid [Nitrofurantoin Macrocrystal]     Light headed   Metronidazole Other (See Comments)    Lightheadedness    Medications: I have reviewed the patient's current medications.  The PMH, PSH, Medications, Allergies, and SH were reviewed and updated.  ROS: Constitutional: Negative for fever, weight loss and weight gain. Cardiovascular: Negative for chest pain and dyspnea on exertion. Respiratory: Is not experiencing shortness of breath at rest. Gastrointestinal: Negative for nausea and vomiting. Neurological: Negative for headaches. Psychiatric: The patient is not nervous/anxious  Blood pressure (!) 142/94, pulse 75, SpO2 100%.  PHYSICAL EXAM:  Exam: General: Well-developed, well-nourished Communication and Voice: Clear pitch and clarity Respiratory Respiratory effort: Equal inspiration and expiration without stridor Cardiovascular Peripheral Vascular: Warm extremities with equal color/perfusion Eyes: No nystagmus with equal extraocular motion bilaterally Neuro/Psych/Balance: Patient oriented to person, place, and time; Appropriate mood and affect; Gait is intact with no imbalance; Cranial nerves I-XII are  intact Head and Face Inspection: Normocephalic and atraumatic without mass or lesion Palpation: Facial skeleton intact without bony stepoffs Salivary Glands: No mass or tenderness Facial Strength: Facial motility symmetric and full bilaterally ENT Pinna: External ear intact and fully developed External canal: Canal is patent with intact skin Tympanic Membrane: Clear and mobile External Nose: No scar or anatomic deformity Internal Nose: Septum is ***. No polyp, or  purulence. Mucosal edema and erythema present.  Bilateral inferior turbinate hypertrophy.  Lips, Teeth, and gums: Mucosa and teeth intact and viable TMJ: No pain to palpation with full mobility Oral cavity/oropharynx: No erythema or exudate, no lesions present Nasopharynx: No mass or lesion with intact mucosa Hypopharynx: Intact mucosa without pooling of secretions Larynx Glottic: Full true vocal cord mobility without lesion or mass Supraglottic: Normal appearing epiglottis and AE folds Interarytenoid Space: Moderate pachydermia&edema Subglottic Space: Patent without lesion or edema Neck Neck and Trachea: Midline trachea without mass or lesion Thyroid: No mass or nodularity Lymphatics: No lymphadenopathy  Procedure:      Studies Reviewed:***  Assessment/Plan: No diagnosis found.  ***  Thank you for allowing me to participate in the care of this patient. Please do not hesitate to contact me with any questions or concerns.   Ashok Croon, MD Otolaryngology [facility] Phone: [phone] Fax: [phone]    08/08/2023, 10:35 AM

## 2023-08-08 NOTE — Patient Instructions (Signed)

## 2023-08-22 ENCOUNTER — Encounter: Payer: Self-pay | Admitting: Family

## 2023-08-22 MED ORDER — RIZATRIPTAN BENZOATE 10 MG PO TBDP: 10.0000 mg | ORAL_TABLET | ORAL | 1 refills | Status: DC | PRN

## 2023-09-07 ENCOUNTER — Encounter (INDEPENDENT_AMBULATORY_CARE_PROVIDER_SITE_OTHER): Payer: Self-pay | Admitting: Family

## 2023-09-08 DIAGNOSIS — N951 Menopausal and female climacteric states: Secondary | ICD-10-CM | POA: Diagnosis not present

## 2023-09-08 MED ORDER — CITALOPRAM HYDROBROMIDE 10 MG PO TABS
10.0000 mg | ORAL_TABLET | Freq: Every day | ORAL | 0 refills | Status: DC
Start: 2023-09-08 — End: 2023-09-14

## 2023-09-08 NOTE — Telephone Encounter (Signed)

## 2023-09-13 ENCOUNTER — Encounter: Payer: Self-pay | Admitting: Family Medicine

## 2023-09-14 MED ORDER — VENLAFAXINE HCL ER 37.5 MG PO CP24
37.5000 mg | ORAL_CAPSULE | Freq: Every day | ORAL | 3 refills | Status: DC
Start: 2023-09-14 — End: 2024-02-16
  Filled 2023-10-13: qty 30, 30d supply, fill #0
  Filled 2024-01-07: qty 30, 30d supply, fill #1

## 2023-09-14 NOTE — Addendum Note (Signed)
 Addended by: Abbe Amsterdam C on: 09/14/2023 09:03 PM   Modules accepted: Orders

## 2023-10-12 ENCOUNTER — Encounter: Payer: Self-pay | Admitting: Family

## 2023-10-13 ENCOUNTER — Other Ambulatory Visit (HOSPITAL_COMMUNITY): Payer: Self-pay

## 2023-10-13 ENCOUNTER — Other Ambulatory Visit (HOSPITAL_BASED_OUTPATIENT_CLINIC_OR_DEPARTMENT_OTHER): Payer: Self-pay

## 2023-10-13 ENCOUNTER — Other Ambulatory Visit: Payer: Self-pay

## 2023-10-13 ENCOUNTER — Encounter: Payer: Self-pay | Admitting: Family

## 2023-10-17 ENCOUNTER — Other Ambulatory Visit: Payer: Self-pay

## 2023-10-17 ENCOUNTER — Encounter: Payer: Self-pay | Admitting: Family

## 2023-10-17 MED ORDER — RIZATRIPTAN BENZOATE 10 MG PO TBDP: 10.0000 mg | ORAL_TABLET | ORAL | 1 refills | Status: DC | PRN

## 2023-10-22 ENCOUNTER — Encounter: Payer: Self-pay | Admitting: Obstetrics & Gynecology

## 2023-11-11 ENCOUNTER — Encounter: Payer: Self-pay | Admitting: Family

## 2023-11-11 DIAGNOSIS — M722 Plantar fascial fibromatosis: Secondary | ICD-10-CM

## 2023-11-16 ENCOUNTER — Other Ambulatory Visit: Payer: Self-pay | Admitting: Family

## 2023-11-16 ENCOUNTER — Other Ambulatory Visit (HOSPITAL_BASED_OUTPATIENT_CLINIC_OR_DEPARTMENT_OTHER): Payer: Self-pay

## 2023-11-16 ENCOUNTER — Encounter: Payer: Self-pay | Admitting: Family

## 2023-11-16 MED ORDER — RIZATRIPTAN BENZOATE 10 MG PO TBDP
10.0000 mg | ORAL_TABLET | ORAL | 1 refills | Status: DC | PRN
  Filled 2023-11-16 – 2023-12-15 (×2): qty 10, 30d supply, fill #0
  Filled 2023-12-15: qty 10, fill #0
  Filled 2024-01-07: qty 10, 30d supply, fill #1

## 2023-11-20 ENCOUNTER — Other Ambulatory Visit: Payer: Self-pay

## 2023-11-20 ENCOUNTER — Encounter: Payer: Self-pay | Admitting: Family

## 2023-11-20 MED ORDER — DICLOFENAC SODIUM 75 MG PO TBEC
75.0000 mg | DELAYED_RELEASE_TABLET | Freq: Two times a day (BID) | ORAL | 0 refills | Status: AC | PRN
  Filled 2023-11-20 – 2023-11-24 (×2): qty 30, 15d supply, fill #0

## 2023-11-21 ENCOUNTER — Other Ambulatory Visit (HOSPITAL_BASED_OUTPATIENT_CLINIC_OR_DEPARTMENT_OTHER): Payer: Self-pay

## 2023-11-21 MED ORDER — DICLOFENAC SODIUM 75 MG PO TBEC
75.0000 mg | DELAYED_RELEASE_TABLET | Freq: Two times a day (BID) | ORAL | 0 refills | Status: DC
  Filled 2023-11-21 – 2023-11-24 (×2): qty 30, 15d supply, fill #0

## 2023-11-21 NOTE — Addendum Note (Signed)
 Addended by: Ferd Householder on: 11/21/2023 03:28 PM   Modules accepted: Orders

## 2023-11-24 ENCOUNTER — Ambulatory Visit: Admitting: Podiatry

## 2023-11-24 ENCOUNTER — Other Ambulatory Visit (HOSPITAL_BASED_OUTPATIENT_CLINIC_OR_DEPARTMENT_OTHER): Payer: Self-pay

## 2023-11-24 ENCOUNTER — Other Ambulatory Visit: Payer: Self-pay

## 2023-12-15 ENCOUNTER — Other Ambulatory Visit (HOSPITAL_BASED_OUTPATIENT_CLINIC_OR_DEPARTMENT_OTHER): Payer: Self-pay

## 2023-12-15 ENCOUNTER — Other Ambulatory Visit (HOSPITAL_COMMUNITY): Payer: Self-pay

## 2023-12-16 ENCOUNTER — Ambulatory Visit: Admitting: Family

## 2023-12-21 ENCOUNTER — Ambulatory Visit: Admitting: Family

## 2023-12-21 ENCOUNTER — Other Ambulatory Visit (HOSPITAL_COMMUNITY)
Admission: RE | Admit: 2023-12-21 | Discharge: 2023-12-21 | Disposition: A | Discharge status: Home / Self Care | Source: Ambulatory Visit | Attending: Family | Admitting: Family

## 2023-12-21 VITALS — BP 111/72 | HR 69 | Temp 98.0°F | Resp 16 | Ht 67.2 in | Wt 165.0 lb

## 2023-12-21 DIAGNOSIS — L989 Disorder of the skin and subcutaneous tissue, unspecified: Secondary | ICD-10-CM | POA: Diagnosis not present

## 2023-12-21 DIAGNOSIS — Z113 Encounter for screening for infections with a predominantly sexual mode of transmission: Secondary | ICD-10-CM | POA: Insufficient documentation

## 2023-12-21 NOTE — Assessment & Plan Note (Signed)
  Presence of skin tags and a moles with family history of skin problems. She desires dermatology evaluation and possible removal. - Provide dermatology referral.

## 2023-12-21 NOTE — Progress Notes (Signed)
 Subjective:     Patient ID: Maria Hernandez, female    DOB: January 09, 1976, 48 y.o.   MRN: [id number]  Chief Complaint  Patient presents with   Screening for STD    Denies any symptoms     HPI  Discussed the use of AI scribe software for clinical note transcription with the patient, who gave verbal consent to proceed.  History of Present Illness   Maria Hernandez is a 48 year old female who presents for STD screening and dermatology referral.  She is undergoing STD screening as part of routine health maintenance without specific symptoms or concerns related to sexually transmitted infections.  She requests a dermatology referral for evaluation of skin tags and a mole. She has a skin tag on her breast and another one elsewhere, which have been present for some time. Her family history includes an aunt with skin problems requiring laser treatment for similar discolorations.    Health Maintenance Due  Topic Date Due   HPV VACCINES (1 - 3-dose SCDM series) Never done   Colonoscopy  Never done    Past Medical History:  Diagnosis Date   Migraines     Past Surgical History:  Procedure Laterality Date   AUGMENTATION MAMMAPLASTY Bilateral    BREAST ENHANCEMENT SURGERY Bilateral 2005   INNER EAR SURGERY Left    Reports 2 prior ear surgeries for prosthesis. 3rd surgery coming up in January 2019   MANDIBLE OSTEOTOMY  1995    Family History  Problem Relation Age of Onset   Hypothyroidism Mother        died from covid   Hypertension Father    Diabetes type II Father        BKA   Arthritis Brother    Retinal detachment Brother    Breast cancer Maternal Grandmother     Social History   Socioeconomic History   Marital status: Single    Spouse name: Not on file   Number of children: Not on file   Years of education: Not on file   Highest education level: Bachelor's degree (e.g., BA, AB, BS)  Occupational History   Not on file  Tobacco Use   Smoking status: Never    Smokeless tobacco: Never  Vaping Use   Vaping status: Never Used  Substance and Sexual Activity   Alcohol use: Yes    Comment: occasional wine 1- 2 per month   Drug use: Never   Sexual activity: Not Currently    Birth control/protection: None, Condom  Other Topics Concern   Not on file  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   Enjoys movies, shopping, relaxing   Does 3 12 hour shifts   Single   No children   No pets   Social Drivers of Corporate investment banker Strain: Low Risk  (08/01/2023)   Overall Financial Resource Strain (CARDIA)    Difficulty of Paying Living Expenses: Not hard at all  Food Insecurity: Unknown (08/01/2023)   Hunger Vital Sign    Worried About Running Out of Food in the Last Year: Never true    Ran Out of Food in the Last Year: Patient declined  Transportation Needs: Patient Declined (08/01/2023)   PRAPARE - Transportation    Lack of Transportation (Medical): Patient declined    Lack of Transportation (Non-Medical): Patient declined  Physical Activity: Sufficiently Active (08/01/2023)   Exercise Vital Sign    Days of  Exercise per Week: 4 days    Minutes of Exercise per Session: 40 min  Stress: No Stress Concern Present (08/01/2023)   [employer] of Occupational Health - Occupational Stress Questionnaire    Feeling of Stress : Not at all  Social Connections: Moderately Isolated (08/01/2023)   Social Connection and Isolation Panel    Frequency of Communication with Friends and Family: More than three times a week    Frequency of Social Gatherings with Friends and Family: Three times a week    Attends Religious Services: More than 4 times per year    Active Member of Clubs or Organizations: No    Attends Engineer, structural: Not on file    Marital Status: Never married  Intimate Partner Violence: Not on file    Outpatient Medications Prior to Visit  Medication Sig Dispense Refill   cetirizine   (ZYRTEC ) 10 MG tablet Take 1 tablet (10 mg total) by mouth daily. 30 tablet 11   diclofenac  (VOLTAREN ) 75 MG EC tablet Take 1 tablet (75 mg total) by mouth 2 (two) times daily as needed. 30 tablet 0   diclofenac  (VOLTAREN ) 75 MG EC tablet Take 1 tablet (75 mg total) by mouth 2 (two) times daily. 30 tablet 0   famotidine  (PEPCID ) 20 MG tablet Take 1 tablet (20 mg total) by mouth 2 (two) times daily. 30 tablet 1   Multiple Vitamin (MULTIVITAMIN) tablet Take 1 tablet by mouth daily.     progesterone  (PROMETRIUM ) 100 MG capsule Take 1 capsule (100 mg total) by mouth at bedtime. 90 capsule 3   rizatriptan  (MAXALT -MLT) 10 MG disintegrating tablet Take 1 tablet (10 mg total) by mouth as needed for migraine. May repeat in 2 hours if needed 10 tablet 1   venlafaxine  XR (EFFEXOR  XR) 37.5 MG 24 hr capsule Take 1 capsule (37.5 mg total) by mouth daily with breakfast. 30 capsule 3   No facility-administered medications prior to visit.    Allergies  Allergen Reactions   Verapamil Hives   Macrobid  [Nitrofurantoin  Macrocrystal]     Light headed   Metronidazole  Other (See Comments)    Lightheadedness    ROS See HPI    Objective:    Physical Exam Exam conducted with a chaperone present.  Constitutional:      Appearance: Normal appearance.   Cardiovascular:     Rate and Rhythm: Normal rate.  Pulmonary:     Effort: Pulmonary effort is normal.  Genitourinary:    General: Normal vulva.     Exam position: Lithotomy position.   Skin:    General: Skin is warm and dry.   Neurological:     Mental Status: She is alert.      BP 111/72 (BP Location: Right Arm, Patient Position: Sitting, Cuff Size: Small)   Pulse 69   Temp 98 F (36.7 C) (Oral)   Resp 16   Ht 5' 7.2 (1.707 m)   Wt 165 lb (74.8 kg)   SpO2 98%   BMI 25.69 kg/m  Wt Readings from Last 3 Encounters:  12/21/23 165 lb (74.8 kg)  08/01/23 163 lb (73.9 kg)  07/11/23 164 lb 3.2 oz (74.5 kg)       Assessment & Plan:    Problem List Items Addressed This Visit       Unprioritized   Skin lesion - Primary    Presence of skin tags and a moles with family history of skin problems. She desires dermatology evaluation and possible removal. - Provide dermatology  referral.      Relevant Orders   Ambulatory referral to Dermatology   Other Visit Diagnoses       Screening examination for STI       Relevant Orders   Cervicovaginal ancillary only( [facility])   HepB+HepC+HIV Panel   RPR   HSV 2 antibody, IgG       I am having Romie J. Christopher maintain her multivitamin, progesterone , famotidine , cetirizine , venlafaxine  XR, rizatriptan , diclofenac , and diclofenac .  No orders of the defined types were placed in this encounter.

## 2023-12-21 NOTE — Patient Instructions (Signed)
 VISIT SUMMARY:  Today, you came in for STD screening and requested a dermatology referral for skin tags and a mole.  YOUR PLAN:  SKIN TAGS AND MOLE: You have skin tags and a mole, and there is a family history of skin problems. You want to have them evaluated and possibly removed. -You will be referred to a dermatologist for evaluation and possible removal of the skin tags and mole.  SEXUALLY TRANSMITTED INFECTION (STI) SCREENING: You requested STI screening as part of your routine health maintenance. -An STI screening swab will be ordered.  GENERAL HEALTH MAINTENANCE: Your last Pap smear in March 2022 was negative for HPV. Your next Pap smear is due by March 2027. -Schedule your next Pap smear by March 2027.

## 2023-12-22 LAB — CERVICOVAGINAL ANCILLARY ONLY
Chlamydia: NEGATIVE
Comment: NEGATIVE
Comment: NEGATIVE
Comment: NORMAL
Neisseria Gonorrhea: NEGATIVE
Trichomonas: NEGATIVE

## 2023-12-22 LAB — RPR: RPR Ser Ql: NONREACTIVE

## 2023-12-22 LAB — HSV 2 ANTIBODY, IGG: HSV 2 Glycoprotein G Ab, IgG: <0.9 {index}

## 2023-12-26 ENCOUNTER — Encounter: Payer: Self-pay | Admitting: Family

## 2023-12-28 ENCOUNTER — Ambulatory Visit: Payer: Self-pay | Admitting: Family

## 2023-12-28 LAB — HEPB+HEPC+HIV PANEL
HIV Screen 4th Generation wRfx: NONREACTIVE
Hep B Core Total Ab: NEGATIVE
Hep B E Ab: NONREACTIVE
Hep B E Ag: NEGATIVE
Hep B Surface Ab, Qual: REACTIVE
Hep C Virus Ab: NONREACTIVE
Hepatitis B Surface Ag: NEGATIVE

## 2024-01-01 ENCOUNTER — Encounter (INDEPENDENT_AMBULATORY_CARE_PROVIDER_SITE_OTHER): Payer: Self-pay | Admitting: Family

## 2024-01-01 DIAGNOSIS — H00015 Hordeolum externum left lower eyelid: Secondary | ICD-10-CM

## 2024-01-02 ENCOUNTER — Other Ambulatory Visit (HOSPITAL_BASED_OUTPATIENT_CLINIC_OR_DEPARTMENT_OTHER): Payer: Self-pay

## 2024-01-02 DIAGNOSIS — H00015 Hordeolum externum left lower eyelid: Secondary | ICD-10-CM | POA: Diagnosis not present

## 2024-01-02 MED ORDER — OFLOXACIN 0.3 % OP SOLN
2.0000 [drp] | Freq: Four times a day (QID) | OPHTHALMIC | 0 refills | Status: DC
  Filled 2024-01-02: qty 5, 13d supply, fill #0

## 2024-01-02 NOTE — Telephone Encounter (Signed)

## 2024-01-09 ENCOUNTER — Other Ambulatory Visit (HOSPITAL_BASED_OUTPATIENT_CLINIC_OR_DEPARTMENT_OTHER): Payer: Self-pay

## 2024-01-10 ENCOUNTER — Encounter: Payer: Self-pay | Admitting: Family

## 2024-01-10 DIAGNOSIS — R87618 Other abnormal cytological findings on specimens from cervix uteri: Secondary | ICD-10-CM

## 2024-01-11 ENCOUNTER — Other Ambulatory Visit (HOSPITAL_BASED_OUTPATIENT_CLINIC_OR_DEPARTMENT_OTHER): Payer: Self-pay

## 2024-01-13 NOTE — Progress Notes (Unsigned)
 [facility] [address] [place], [place] [address] [phone]  Date:  01/16/2024   Name:  TYKERIA WAWRZYNIAK   DOB:  September 16, 1975   MRN:  [id number]  PCP:  Daryl Setter, NP    Chief Complaint: No chief complaint on file.   History of Present Illness:  Maria Hernandez is a 48 y.o. very pleasant female patient who presents with the following:  Patient seen today with concern of STI screening Most recent visit with myself was in January of this year She is generally in fairly good health  Patient Active Problem List   Diagnosis Date Noted   Skin lesion 12/21/2023   Screening for colon cancer 08/01/2023   Routine screening for STI (sexually transmitted infection) 08/01/2023   Irregular menstrual bleeding 08/01/2023   Screening examination for STD (sexually transmitted disease) 02/08/2023   Preventative health care 03/30/2022   Knee pain, bilateral 07/16/2019   Chronic right-sided low back pain without sciatica 05/09/2019   Migraines 06/07/2017   Hearing loss 06/07/2017    Past Medical History:  Diagnosis Date   Migraines     Past Surgical History:  Procedure Laterality Date   AUGMENTATION MAMMAPLASTY Bilateral    BREAST ENHANCEMENT SURGERY Bilateral 2005   INNER EAR SURGERY Left    Reports 2 prior ear surgeries for prosthesis. 3rd surgery coming up in January 2019   MANDIBLE OSTEOTOMY  1995    Social History   Tobacco Use   Smoking status: Never   Smokeless tobacco: Never  Vaping Use   Vaping status: Never Used  Substance Use Topics   Alcohol use: Yes    Comment: occasional wine 1- 2 per month   Drug use: Never    Family History  Problem Relation Age of Onset   Hypothyroidism Mother        died from covid   Hypertension Father    Diabetes type II Father        BKA   Arthritis Brother    Retinal detachment Brother    Breast cancer Maternal Grandmother     Allergies  Allergen  Reactions   Verapamil Hives   Macrobid  [Nitrofurantoin  Macrocrystal]     Light headed   Metronidazole  Other (See Comments)    Lightheadedness    Medication list has been reviewed and updated.  Current Outpatient Medications on File Prior to Visit  Medication Sig Dispense Refill   cetirizine  (ZYRTEC ) 10 MG tablet Take 1 tablet (10 mg total) by mouth daily. 30 tablet 11   diclofenac  (VOLTAREN ) 75 MG EC tablet Take 1 tablet (75 mg total) by mouth 2 (two) times daily as needed. 30 tablet 0   diclofenac  (VOLTAREN ) 75 MG EC tablet Take 1 tablet (75 mg total) by mouth 2 (two) times daily. 30 tablet 0   famotidine  (PEPCID ) 20 MG tablet Take 1 tablet (20 mg total) by mouth 2 (two) times daily. 30 tablet 1   Multiple Vitamin (MULTIVITAMIN) tablet Take 1 tablet by mouth daily.     ofloxacin  (OCUFLOX ) 0.3 % ophthalmic solution Place 2 drops into the left eye 4 (four) times daily. 5 mL 0   progesterone  (PROMETRIUM ) 100 MG capsule Take 1 capsule (100 mg total) by mouth at bedtime. 90 capsule 3   rizatriptan  (MAXALT -MLT) 10 MG disintegrating tablet Take 1 tablet (10 mg total) by mouth as needed for migraine. May repeat in 2 hours if needed 10 tablet 1  venlafaxine  XR (EFFEXOR  XR) 37.5 MG 24 hr capsule Take 1 capsule (37.5 mg total) by mouth daily with breakfast. 30 capsule 3   No current facility-administered medications on file prior to visit.    Review of Systems:  As per HPI- otherwise negative.   Physical Examination: There were no vitals filed for this visit. There were no vitals filed for this visit. There is no height or weight on file to calculate BMI. Ideal Body Weight:    GEN: no acute distress. HEENT: Atraumatic, Normocephalic.  Ears and Nose: No external deformity. CV: RRR, No M/G/R. No JVD. No thrill. No extra heart sounds. PULM: CTA B, no wheezes, crackles, rhonchi. No retractions. No resp. distress. No accessory muscle use. ABD: S, NT, ND, +BS. No rebound. No HSM. EXTR: No  c/c/e PSYCH: Normally interactive. Conversant.    Assessment and Plan: ***  Signed Harlene Schroeder, MD

## 2024-01-13 NOTE — Patient Instructions (Incomplete)
 It was good to see you again today, I will be in touch with your labs soon as possible Take care!

## 2024-01-16 ENCOUNTER — Ambulatory Visit: Admitting: Family Medicine

## 2024-01-16 ENCOUNTER — Other Ambulatory Visit (HOSPITAL_COMMUNITY)
Admission: RE | Admit: 2024-01-16 | Discharge: 2024-01-16 | Disposition: A | Discharge status: Home / Self Care | Source: Ambulatory Visit | Attending: Family Medicine | Admitting: Family Medicine

## 2024-01-16 VITALS — BP 112/74 | HR 70 | Ht 67.0 in | Wt 167.0 lb

## 2024-01-16 DIAGNOSIS — Z113 Encounter for screening for infections with a predominantly sexual mode of transmission: Secondary | ICD-10-CM | POA: Diagnosis not present

## 2024-01-16 DIAGNOSIS — Z124 Encounter for screening for malignant neoplasm of cervix: Secondary | ICD-10-CM | POA: Insufficient documentation

## 2024-01-19 LAB — HIV ANTIBODY (ROUTINE TESTING W REFLEX): HIV 1&2 Ab, 4th Generation: NONREACTIVE

## 2024-01-19 LAB — HSV(HERPES SIMPLEX VRS) I + II AB-IGG
HSV 1 IGG,TYPE SPECIFIC AB: <0.9 {index}
HSV 2 IGG,TYPE SPECIFIC AB: <0.9 {index}

## 2024-01-19 LAB — HEPATITIS B SURFACE ANTIGEN: Hepatitis B Surface Ag: NONREACTIVE

## 2024-01-19 LAB — CYTOLOGY - PAP
Adequacy: ABSENT
Chlamydia: NEGATIVE
Comment: NEGATIVE
Comment: NEGATIVE
Comment: NEGATIVE
Comment: NORMAL
High risk HPV: POSITIVE — AB
Neisseria Gonorrhea: NEGATIVE
Trichomonas: NEGATIVE

## 2024-01-19 LAB — RPR: RPR Ser Ql: NONREACTIVE

## 2024-01-19 LAB — HEPATITIS C ANTIBODY: Hepatitis C Ab: NONREACTIVE

## 2024-01-20 ENCOUNTER — Encounter: Payer: Self-pay | Admitting: Family

## 2024-01-20 ENCOUNTER — Encounter: Payer: Self-pay | Admitting: Family Medicine

## 2024-01-20 DIAGNOSIS — R87618 Other abnormal cytological findings on specimens from cervix uteri: Secondary | ICD-10-CM | POA: Insufficient documentation

## 2024-01-20 NOTE — Telephone Encounter (Unsigned)
 Copied from CRM [id number]. Topic: Clinical - Lab/Test Results >> Jan 20, 2024  8:23 AM Carlatta H wrote: Reason for CRM: Please call patient about lab results//She would like to discuss further

## 2024-01-20 NOTE — Telephone Encounter (Signed)
 Multiple messages regarding results.

## 2024-01-20 NOTE — Telephone Encounter (Signed)
 Copied from CRM [id number]. Topic: General - Other >> Jan 20, 2024 10:46 AM Thersia BROCKS wrote: Reason for CRM: Patient called in stated she would like for Dr.Copland to give her call back regarding the lab results, would like a callback

## 2024-01-20 NOTE — Telephone Encounter (Signed)
 Provider aware of pt messages and will contact her when she gets a chance

## 2024-01-20 NOTE — Addendum Note (Signed)
 Addended by: DARYL SETTER on: 01/20/2024 01:11 PM   Modules accepted: Orders

## 2024-02-07 ENCOUNTER — Ambulatory Visit: Admitting: Dermatology

## 2024-02-07 ENCOUNTER — Encounter: Payer: Self-pay | Admitting: Family

## 2024-02-07 ENCOUNTER — Other Ambulatory Visit (HOSPITAL_BASED_OUTPATIENT_CLINIC_OR_DEPARTMENT_OTHER): Payer: Self-pay

## 2024-02-07 ENCOUNTER — Encounter (INDEPENDENT_AMBULATORY_CARE_PROVIDER_SITE_OTHER): Payer: Self-pay | Admitting: Family

## 2024-02-07 DIAGNOSIS — K219 Gastro-esophageal reflux disease without esophagitis: Secondary | ICD-10-CM

## 2024-02-07 MED ORDER — ESOMEPRAZOLE MAGNESIUM 40 MG PO CPDR
40.0000 mg | DELAYED_RELEASE_CAPSULE | Freq: Every day | ORAL | 0 refills | Status: DC
  Filled 2024-02-07: qty 90, 90d supply, fill #0

## 2024-02-07 NOTE — Telephone Encounter (Signed)

## 2024-02-08 ENCOUNTER — Other Ambulatory Visit (HOSPITAL_BASED_OUTPATIENT_CLINIC_OR_DEPARTMENT_OTHER): Payer: Self-pay

## 2024-02-08 MED ORDER — LEVOCETIRIZINE DIHYDROCHLORIDE 5 MG PO TABS
5.0000 mg | ORAL_TABLET | Freq: Every evening | ORAL | 1 refills | Status: DC
  Filled 2024-02-08: qty 90, 90d supply, fill #0
  Filled 2024-04-27: qty 90, 90d supply, fill #1

## 2024-02-11 ENCOUNTER — Other Ambulatory Visit: Payer: Self-pay | Admitting: Family

## 2024-02-13 ENCOUNTER — Encounter: Payer: Self-pay | Admitting: Family

## 2024-02-13 ENCOUNTER — Telehealth: Payer: Self-pay

## 2024-02-13 ENCOUNTER — Other Ambulatory Visit (HOSPITAL_BASED_OUTPATIENT_CLINIC_OR_DEPARTMENT_OTHER): Payer: Self-pay

## 2024-02-13 DIAGNOSIS — R21 Rash and other nonspecific skin eruption: Secondary | ICD-10-CM

## 2024-02-13 MED ORDER — RIZATRIPTAN BENZOATE 10 MG PO TBDP
10.0000 mg | ORAL_TABLET | ORAL | 1 refills | Status: DC | PRN
  Filled 2024-02-13: qty 10, 30d supply, fill #0
  Filled 2024-03-23: qty 10, 30d supply, fill #1

## 2024-02-13 NOTE — Telephone Encounter (Signed)
 Patient scheduled to see Leita HERO. 02/16/24

## 2024-02-13 NOTE — Telephone Encounter (Signed)
 Copied from CRM [id number]. Topic: Appointments - Scheduling Inquiry for Clinic >> Feb 13, 2024 10:57 AM Mesmerise C wrote: Reason for CRM: Patient trying to be seen sooner for appointment due to rash on her face her appointment is 9/3 when looking for available times with patient's schedule got disconnected attempted to call patient back no response, please call patient back to reschedule sooner appointment

## 2024-02-13 NOTE — Telephone Encounter (Signed)
 Pt has scheduled OV 02/16/2024.

## 2024-02-16 ENCOUNTER — Encounter: Payer: Self-pay | Admitting: Family

## 2024-02-16 ENCOUNTER — Ambulatory Visit: Admitting: Family

## 2024-02-16 ENCOUNTER — Other Ambulatory Visit (HOSPITAL_BASED_OUTPATIENT_CLINIC_OR_DEPARTMENT_OTHER): Payer: Self-pay

## 2024-02-16 VITALS — BP 112/70 | HR 75 | Ht 67.0 in | Wt 166.0 lb

## 2024-02-16 DIAGNOSIS — R21 Rash and other nonspecific skin eruption: Secondary | ICD-10-CM | POA: Diagnosis not present

## 2024-02-16 MED ORDER — FLUCONAZOLE 150 MG PO TABS
ORAL_TABLET | ORAL | 0 refills | Status: DC
  Filled 2024-02-16: qty 2, 2d supply, fill #0

## 2024-02-16 MED ORDER — DOXYCYCLINE HYCLATE 100 MG PO TABS
100.0000 mg | ORAL_TABLET | Freq: Two times a day (BID) | ORAL | 0 refills | Status: DC
  Filled 2024-02-16: qty 28, 14d supply, fill #0

## 2024-02-16 NOTE — Progress Notes (Signed)
 Maria Hernandez is a 48 y.o. female with the following history as recorded in EpicCare:  Patient Active Problem List   Diagnosis Date Noted   Gastroesophageal reflux disease 02/07/2024   Abnormal Papanicolaou smear of cervix with positive human papilloma virus (HPV) test 01/20/2024   Skin lesion 12/21/2023   Screening for colon cancer 08/01/2023   Routine screening for STI (sexually transmitted infection) 08/01/2023   Irregular menstrual bleeding 08/01/2023   Screening examination for STD (sexually transmitted disease) 02/08/2023   Preventative health care 03/30/2022   Knee pain, bilateral 07/16/2019   Chronic right-sided low back pain without sciatica 05/09/2019   Migraines 06/07/2017   Hearing loss 06/07/2017    Current Outpatient Medications  Medication Sig Dispense Refill   diclofenac  (VOLTAREN ) 75 MG EC tablet Take 1 tablet (75 mg total) by mouth 2 (two) times daily as needed. 30 tablet 0   diclofenac  (VOLTAREN ) 75 MG EC tablet Take 1 tablet (75 mg total) by mouth 2 (two) times daily. 30 tablet 0   doxycycline  (VIBRA -TABS) 100 MG tablet Take 1 tablet (100 mg total) by mouth 2 (two) times daily. 28 tablet 0   esomeprazole  (NEXIUM ) 40 MG capsule Take 1 capsule (40 mg total) by mouth daily. 90 capsule 0   famotidine  (PEPCID ) 20 MG tablet Take 1 tablet (20 mg total) by mouth 2 (two) times daily. 30 tablet 1   fluconazole  (DIFLUCAN ) 150 MG tablet Take 1 tablet as directed; repeat after 72 hours 2 tablet 0   levocetirizine (XYZAL ) 5 MG tablet Take 1 tablet (5 mg total) by mouth every evening. 90 tablet 1   Multiple Vitamin (MULTIVITAMIN) tablet Take 1 tablet by mouth daily.     ofloxacin  (OCUFLOX ) 0.3 % ophthalmic solution Place 2 drops into the left eye 4 (four) times daily. 5 mL 0   rizatriptan  (MAXALT -MLT) 10 MG disintegrating tablet Take 1 tablet (10 mg total) by mouth as needed for migraine. May repeat in 2 hours if needed. 10 tablet 1   No current facility-administered  medications for this visit.    Allergies: Verapamil, Macrobid  [nitrofurantoin  macrocrystal], and Metronidazole   Past Medical History:  Diagnosis Date   Migraines     Past Surgical History:  Procedure Laterality Date   AUGMENTATION MAMMAPLASTY Bilateral    BREAST ENHANCEMENT SURGERY Bilateral 2005   INNER EAR SURGERY Left    Reports 2 prior ear surgeries for prosthesis. 3rd surgery coming up in January 2019   MANDIBLE OSTEOTOMY  1995    Family History  Problem Relation Age of Onset   Hypothyroidism Mother        died from covid   Hypertension Father    Diabetes type II Father        BKA   Arthritis Brother    Retinal detachment Brother    Breast cancer Maternal Grandmother     Social History   Tobacco Use   Smoking status: Never   Smokeless tobacco: Never  Substance Use Topics   Alcohol use: Yes    Comment: occasional wine 1- 2 per month    Subjective:   Presents with concerns for facial rash/ eczema that has been present for the past few months; did see dermatology in the past year with similar concerns and was given Doxycycline  with good response;   Objective:  Vitals:   02/16/24 1511  BP: 112/70  Pulse: 75  Weight: 166 lb (75.3 kg)  Height: 5' 7 (1.702 m)    General: Well developed, well nourished,  in no acute distress  Skin : Warm and dry. Mild erythematous rash noted across both cheeks Head: Normocephalic and atraumatic  Eyes: Sclera and conjunctiva clear; pupils round and reactive to light; extraocular movements intact  Ears: External normal; canals clear; tympanic membranes normal  Oropharynx: Pink, supple. No suspicious lesions  Neck: Supple without thyromegaly, adenopathy  Lungs: Respirations unlabored;  Neurologic: Alert and oriented; speech intact; face symmetrical; moves all extremities well; CNII-XII intact without focal deficit   Assessment:  1. Facial rash     Plan:  Suspect rosacea; will check ANA, sed rate today; re-start Doxycyline 100  mg bid x 14 days; keep planned follow up with dermatology to discuss long term treatment options;   No follow-ups on file.  Orders Placed This Encounter  Procedures   Antinuclear Antib (ANA)   Sedimentation rate    Requested Prescriptions   Signed Prescriptions Disp Refills   doxycycline  (VIBRA -TABS) 100 MG tablet 28 tablet 0    Sig: Take 1 tablet (100 mg total) by mouth 2 (two) times daily.   fluconazole  (DIFLUCAN ) 150 MG tablet 2 tablet 0    Sig: Take 1 tablet as directed; repeat after 72 hours

## 2024-02-17 ENCOUNTER — Encounter: Payer: Self-pay | Admitting: Family

## 2024-02-17 LAB — SEDIMENTATION RATE: Sed Rate: 31 mm/h — ABNORMAL HIGH (ref 0–20)

## 2024-02-18 LAB — ANA: Anti Nuclear Antibody (ANA): NEGATIVE

## 2024-02-20 ENCOUNTER — Ambulatory Visit: Payer: Self-pay | Admitting: Family

## 2024-02-24 ENCOUNTER — Encounter: Payer: Self-pay | Admitting: Family

## 2024-02-27 ENCOUNTER — Telehealth: Payer: Self-pay

## 2024-02-27 NOTE — Telephone Encounter (Signed)
 Patient had an abnormal pap at her PCP office. They would like to know what the next steps are.   Informed patient that I would send a message over to Dr. Abigail. Told her that Dr. Eveline is typically in the [facility] office and Dr. Abigail is here consistently.

## 2024-03-01 ENCOUNTER — Encounter: Payer: Self-pay | Admitting: Family

## 2024-03-03 MED ORDER — DOXYCYCLINE HYCLATE 100 MG PO TABS
100.0000 mg | ORAL_TABLET | Freq: Two times a day (BID) | ORAL | 0 refills | Status: DC
  Filled 2024-03-03: qty 28, 14d supply, fill #0

## 2024-03-04 ENCOUNTER — Other Ambulatory Visit (HOSPITAL_BASED_OUTPATIENT_CLINIC_OR_DEPARTMENT_OTHER): Payer: Self-pay

## 2024-03-05 ENCOUNTER — Other Ambulatory Visit (HOSPITAL_BASED_OUTPATIENT_CLINIC_OR_DEPARTMENT_OTHER): Payer: Self-pay

## 2024-03-06 ENCOUNTER — Other Ambulatory Visit (HOSPITAL_BASED_OUTPATIENT_CLINIC_OR_DEPARTMENT_OTHER): Payer: Self-pay

## 2024-03-07 ENCOUNTER — Ambulatory Visit: Admitting: Family

## 2024-03-08 ENCOUNTER — Other Ambulatory Visit (HOSPITAL_COMMUNITY)
Admission: RE | Admit: 2024-03-08 | Discharge: 2024-03-08 | Disposition: A | Discharge status: Home / Self Care | Source: Ambulatory Visit | Attending: Obstetrics and Gynecology | Admitting: Obstetrics and Gynecology

## 2024-03-08 ENCOUNTER — Other Ambulatory Visit (HOSPITAL_BASED_OUTPATIENT_CLINIC_OR_DEPARTMENT_OTHER): Payer: Self-pay

## 2024-03-08 ENCOUNTER — Encounter: Payer: Self-pay | Admitting: Obstetrics and Gynecology

## 2024-03-08 ENCOUNTER — Ambulatory Visit (INDEPENDENT_AMBULATORY_CARE_PROVIDER_SITE_OTHER): Admitting: Obstetrics and Gynecology

## 2024-03-08 VITALS — BP 122/74 | HR 68 | Wt 162.0 lb

## 2024-03-08 DIAGNOSIS — N72 Inflammatory disease of cervix uteri: Secondary | ICD-10-CM | POA: Diagnosis not present

## 2024-03-08 DIAGNOSIS — N870 Mild cervical dysplasia: Secondary | ICD-10-CM | POA: Diagnosis not present

## 2024-03-08 DIAGNOSIS — R87612 Low grade squamous intraepithelial lesion on cytologic smear of cervix (LGSIL): Secondary | ICD-10-CM | POA: Insufficient documentation

## 2024-03-08 MED ORDER — FLUZONE 0.5 ML IM SUSY
0.5000 mL | PREFILLED_SYRINGE | Freq: Once | INTRAMUSCULAR | 0 refills | Status: AC
Start: 2024-03-08 — End: 2024-03-09
  Filled 2024-03-08: qty 0.5, 1d supply, fill #0

## 2024-03-08 NOTE — Progress Notes (Signed)
    GYNECOLOGY OFFICE COLPOSCOPY PROCEDURE NOTE  48 y.o. G0P0000 here for colposcopy for LSIL pap smear on 01/16/2024. Discussed role for HPV in cervical dysplasia, need for surveillance.  Reviewed the risks of pain, bleeding, infection, inadequate sample. Patient gave informed written consent, time out was performed.  Placed in lithotomy position. Cervix viewed with speculum and colposcope after application of acetic acid.   Colposcopy adequate? Yes  acetowhite lesion(s) noted at 8 and 10 o'clock; corresponding biopsies obtained.  ECC specimen obtained. All specimens were labeled and sent to pathology.  Chaperone was present during entire procedure.  Patient was given post procedure instructions.  Will follow up pathology and manage accordingly; patient will be contacted with results and recommendations.     Rollo ONEIDA Bring, MD, FACOG Obstetrician & Gynecologist, [facility], [facility]

## 2024-03-09 ENCOUNTER — Ambulatory Visit: Admitting: Family

## 2024-03-12 ENCOUNTER — Other Ambulatory Visit (HOSPITAL_BASED_OUTPATIENT_CLINIC_OR_DEPARTMENT_OTHER): Payer: Self-pay

## 2024-03-12 LAB — SURGICAL PATHOLOGY

## 2024-03-12 MED ORDER — FLUCONAZOLE 150 MG PO TABS
150.0000 mg | ORAL_TABLET | Freq: Every day | ORAL | 0 refills | Status: DC
  Filled 2024-03-12: qty 2, 2d supply, fill #0

## 2024-03-12 NOTE — Addendum Note (Signed)
 Addended by: DARYL SETTER on: 03/12/2024 09:28 AM   Modules accepted: Orders

## 2024-03-13 ENCOUNTER — Ambulatory Visit: Payer: Self-pay | Admitting: Obstetrics and Gynecology

## 2024-03-23 ENCOUNTER — Encounter: Payer: Self-pay | Admitting: Family

## 2024-03-23 ENCOUNTER — Other Ambulatory Visit (HOSPITAL_BASED_OUTPATIENT_CLINIC_OR_DEPARTMENT_OTHER): Payer: Self-pay

## 2024-03-23 ENCOUNTER — Ambulatory Visit: Admitting: Family

## 2024-03-23 VITALS — BP 105/75 | HR 76 | Ht 67.0 in | Wt 163.6 lb

## 2024-03-23 DIAGNOSIS — G43829 Menstrual migraine, not intractable, without status migrainosus: Secondary | ICD-10-CM | POA: Diagnosis not present

## 2024-03-23 DIAGNOSIS — K219 Gastro-esophageal reflux disease without esophagitis: Secondary | ICD-10-CM | POA: Diagnosis not present

## 2024-03-23 DIAGNOSIS — Z1231 Encounter for screening mammogram for malignant neoplasm of breast: Secondary | ICD-10-CM | POA: Diagnosis not present

## 2024-03-23 DIAGNOSIS — Z1211 Encounter for screening for malignant neoplasm of colon: Secondary | ICD-10-CM | POA: Diagnosis not present

## 2024-03-23 DIAGNOSIS — R21 Rash and other nonspecific skin eruption: Secondary | ICD-10-CM | POA: Diagnosis not present

## 2024-03-23 DIAGNOSIS — R700 Elevated erythrocyte sedimentation rate: Secondary | ICD-10-CM

## 2024-03-23 LAB — SEDIMENTATION RATE: Sed Rate: 18 mm/h (ref 0–20)

## 2024-03-23 NOTE — Progress Notes (Signed)
 Subjective:     Patient ID: Maria Hernandez, female    DOB: [birth year], 48 y.o.   MRN: [id number]  Chief Complaint  Patient presents with   Follow-up    Repeat lab    HPI  Discussed the use of AI scribe software for clinical note transcription with the patient, who gave verbal consent to proceed.  History of Present Illness  Maria Hernandez is a 48 year old female who presents for follow-up of a facial rash and chronic migraines.  She was previously seen in August for a facial rash with an elevated sed rate, but ANA and other tests were negative. She is completing a treatment course for the rash and uses products for sensitive skin. She is concerned about hormonal changes due to perimenopause affecting her skin.  She experiences chronic migraines, particularly three days before her menstrual period. A recent severe migraine required two doses of Maxalt , which alleviated the headache. She takes Xyzal  nightly and is considering switching to gummy multivitamins due to difficulty swallowing large pills. She also takes 2000 mg of vitamin C daily and HPV supplements since July.  She has chronic gastric issues managed with Nexium  40 mg, which is effective. She has Voltaren  tablets for knee pain but has not needed them due to regular gym activity. Her blood pressure is usually low, with occasional elevations attributed to Maxalt  use, headache pain, and consumption of energy drinks and coffee. She has received her flu shot and completed the hepatitis B series for work. She is unsure about her TDAP status but believes she is up to date. She is considering Cologuard for colon cancer screening due to lack of family support for a colonoscopy. No blood in the stool.     Health Maintenance Due  Topic Date Due   Colonoscopy  Never done   Mammogram  04/24/2024    Past Medical History:  Diagnosis Date   Migraines     Past Surgical History:  Procedure Laterality Date   AUGMENTATION  MAMMAPLASTY Bilateral    BREAST ENHANCEMENT SURGERY Bilateral 2005   INNER EAR SURGERY Left    Reports 2 prior ear surgeries for prosthesis. 3rd surgery coming up in January 2019   MANDIBLE OSTEOTOMY  1995    Family History  Problem Relation Age of Onset   Hypothyroidism Mother        died from covid   Hypertension Father    Diabetes type II Father        BKA   Arthritis Brother    Retinal detachment Brother    Breast cancer Maternal Grandmother     Social History   Socioeconomic History   Marital status: Single    Spouse name: Not on file   Number of children: Not on file   Years of education: Not on file   Highest education level: Bachelor's degree (e.g., BA, AB, BS)  Occupational History   Not on file  Tobacco Use   Smoking status: Never   Smokeless tobacco: Never  Vaping Use   Vaping status: Never Used  Substance and Sexual Activity   Alcohol use: Yes    Comment: occasional wine 1- 2 per month   Drug use: Never   Sexual activity: Not Currently    Birth control/protection: None, Condom  Other Topics Concern   Not on file  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   Enjoys movies, shopping, relaxing   Does  3 12 hour shifts   Single   No children   No pets   Social Drivers of Corporate investment banker Strain: Low Risk  (08/01/2023)   Overall Financial Resource Strain (CARDIA)    Difficulty of Paying Living Expenses: Not hard at all  Food Insecurity: Unknown (08/01/2023)   Hunger Vital Sign    Worried About Running Out of Food in the Last Year: Never true    Ran Out of Food in the Last Year: Patient declined  Transportation Needs: Patient Declined (08/01/2023)   PRAPARE - Administrator, Civil Service (Medical): Patient declined    Lack of Transportation (Non-Medical): Patient declined  Physical Activity: Sufficiently Active (08/01/2023)   Exercise Vital Sign    Days of Exercise per Week: 4 days    Minutes  of Exercise per Session: 40 min  Stress: No Stress Concern Present (08/01/2023)   [employer] of Occupational Health - Occupational Stress Questionnaire    Feeling of Stress : Not at all  Social Connections: Moderately Isolated (08/01/2023)   Social Connection and Isolation Panel    Frequency of Communication with Friends and Family: More than three times a week    Frequency of Social Gatherings with Friends and Family: Three times a week    Attends Religious Services: More than 4 times per year    Active Member of Clubs or Organizations: No    Attends Engineer, structural: Not on file    Marital Status: Never married  Intimate Partner Violence: Not on file    Outpatient Medications Prior to Visit  Medication Sig Dispense Refill   diclofenac  (VOLTAREN ) 75 MG EC tablet Take 1 tablet (75 mg total) by mouth 2 (two) times daily as needed. 30 tablet 0   diclofenac  (VOLTAREN ) 75 MG EC tablet Take 1 tablet (75 mg total) by mouth 2 (two) times daily. 30 tablet 0   doxycycline  (VIBRA -TABS) 100 MG tablet Take 1 tablet (100 mg total) by mouth 2 (two) times daily. (Patient not taking: Reported on 03/08/2024) 28 tablet 0   esomeprazole  (NEXIUM ) 40 MG capsule Take 1 capsule (40 mg total) by mouth daily. (Patient not taking: Reported on 03/08/2024) 90 capsule 0   levocetirizine (XYZAL ) 5 MG tablet Take 1 tablet (5 mg total) by mouth every evening. (Patient not taking: Reported on 03/08/2024) 90 tablet 1   Multiple Vitamin (MULTIVITAMIN) tablet Take 1 tablet by mouth daily. (Patient not taking: Reported on 03/08/2024)     rizatriptan  (MAXALT -MLT) 10 MG disintegrating tablet Take 1 tablet (10 mg total) by mouth as needed for migraine. May repeat in 2 hours if needed. (Patient not taking: Reported on 03/08/2024) 10 tablet 1   famotidine  (PEPCID ) 20 MG tablet Take 1 tablet (20 mg total) by mouth 2 (two) times daily. (Patient not taking: Reported on 03/08/2024) 30 tablet 1   fluconazole  (DIFLUCAN ) 150 MG  tablet Take 1 tablet (150 mg total) by mouth as directed. Repeat after 72 hours. (Patient not taking: Reported on 03/23/2024) 2 tablet 0   ofloxacin  (OCUFLOX ) 0.3 % ophthalmic solution Place 2 drops into the left eye 4 (four) times daily. (Patient not taking: Reported on 03/08/2024) 5 mL 0   No facility-administered medications prior to visit.    Allergies  Allergen Reactions   Verapamil Hives   Macrobid  [Nitrofurantoin  Macrocrystal]     Light headed   Metronidazole  Other (See Comments)    Lightheadedness    ROS See HPI    Objective:  Physical Exam Constitutional:      General: She is not in acute distress.    Appearance: Normal appearance. She is well-developed.  HENT:     Head: Normocephalic and atraumatic.     Right Ear: External ear normal.     Left Ear: External ear normal.  Eyes:     General: No scleral icterus. Neck:     Thyroid : No thyromegaly.  Cardiovascular:     Rate and Rhythm: Normal rate and regular rhythm.     Heart sounds: Normal heart sounds. No murmur heard. Pulmonary:     Effort: Pulmonary effort is normal. No respiratory distress.     Breath sounds: Normal breath sounds. No wheezing.  Musculoskeletal:     Cervical back: Neck supple.  Skin:    General: Skin is warm and dry.     Comments: No facial rash noted today  Neurological:     Mental Status: She is alert and oriented to person, place, and time.  Psychiatric:        Mood and Affect: Mood normal.        Behavior: Behavior normal.        Thought Content: Thought content normal.        Judgment: Judgment normal.      BP 105/75   Pulse 76   Ht 5' 7 (1.702 m)   Wt 163 lb 9.6 oz (74.2 kg)   LMP 03/02/2024 (Exact Date)   SpO2 98%   BMI 25.62 kg/m  Wt Readings from Last 3 Encounters:  03/23/24 163 lb 9.6 oz (74.2 kg)  03/08/24 162 lb (73.5 kg)  02/16/24 166 lb (75.3 kg)       Assessment & Plan:   Problem List Items Addressed This Visit       Unprioritized   Screening for colon  cancer   Relevant Orders   Cologuard   Migraines   Using maxalt  prn- usually premenstrual.       Gastroesophageal reflux disease    Well-controlled with Nexium  40 mg. - Continue Nexium  40 mg daily.      Facial rash    - Continue current medication (doxy) - Monitor for recurrence post-medication. - Consider metronidazole  gel if rash recurs. -Keep upcoming appointment with dermatology      ESR raised - Primary   Update ESR today.        Relevant Orders   Sedimentation rate (Completed)   Other Visit Diagnoses       Breast cancer screening by mammogram       Relevant Orders   MM 3D SCREENING MAMMOGRAM BILATERAL BREAST       I have discontinued Ladonna J. Hitchcock's famotidine , ofloxacin , and fluconazole . I am also having her maintain her multivitamin, diclofenac , esomeprazole , levocetirizine, rizatriptan , and doxycycline .  No orders of the defined types were placed in this encounter.

## 2024-03-23 NOTE — Assessment & Plan Note (Signed)
 Using maxalt  prn- usually premenstrual.

## 2024-03-24 ENCOUNTER — Ambulatory Visit: Payer: Self-pay | Admitting: Family

## 2024-03-25 DIAGNOSIS — R21 Rash and other nonspecific skin eruption: Secondary | ICD-10-CM | POA: Insufficient documentation

## 2024-03-25 DIAGNOSIS — R700 Elevated erythrocyte sedimentation rate: Secondary | ICD-10-CM | POA: Insufficient documentation

## 2024-03-25 NOTE — Assessment & Plan Note (Signed)
  Well-controlled with Nexium  40 mg. - Continue Nexium  40 mg daily.

## 2024-03-25 NOTE — Assessment & Plan Note (Signed)
-   Continue current medication (doxy) - Monitor for recurrence post-medication. - Consider metronidazole  gel if rash recurs. -Keep upcoming appointment with dermatology

## 2024-03-25 NOTE — Assessment & Plan Note (Signed)
 Update ESR today.

## 2024-03-25 NOTE — Patient Instructions (Signed)
 VISIT SUMMARY:  Today, we discussed your facial rash, chronic migraines, and other health concerns. We reviewed your current treatments and made some recommendations for monitoring and managing your conditions.  YOUR PLAN:  FACIAL DERMATITIS: You have a facial rash that you are currently treating. -Continue your current medication for the rash. -Monitor for any recurrence after you finish the medication. -If the rash comes back, consider using metronidazole  gel.  CHRONIC MIGRAINE: You experience migraines, especially before your menstrual period. -Continue using Maxalt  for acute migraine management.  GASTROESOPHAGEAL REFLUX DISEASE: You have chronic gastric issues that are well-controlled with Nexium . -Continue taking Nexium  40 mg daily.  ELEVATED ERYTHROCYTE SEDIMENTATION RATE (ESR): Your ESR was previously elevated, which is nonspecific and possibly transient. -We will order a repeat ESR test.

## 2024-03-27 ENCOUNTER — Encounter: Admitting: Family

## 2024-03-28 ENCOUNTER — Other Ambulatory Visit (HOSPITAL_BASED_OUTPATIENT_CLINIC_OR_DEPARTMENT_OTHER): Payer: Self-pay

## 2024-03-28 ENCOUNTER — Encounter: Payer: Self-pay | Admitting: Family

## 2024-03-28 DIAGNOSIS — L719 Rosacea, unspecified: Secondary | ICD-10-CM

## 2024-03-28 MED ORDER — METRONIDAZOLE 1 % EX GEL
Freq: Every day | CUTANEOUS | 0 refills | Status: AC | PRN
  Filled 2024-03-28 – 2024-04-19 (×2): qty 60, 30d supply, fill #0

## 2024-04-04 DIAGNOSIS — Z1211 Encounter for screening for malignant neoplasm of colon: Secondary | ICD-10-CM | POA: Diagnosis not present

## 2024-04-09 ENCOUNTER — Other Ambulatory Visit (HOSPITAL_BASED_OUTPATIENT_CLINIC_OR_DEPARTMENT_OTHER): Payer: Self-pay

## 2024-04-10 LAB — COLOGUARD: COLOGUARD: NEGATIVE

## 2024-04-19 ENCOUNTER — Other Ambulatory Visit (HOSPITAL_BASED_OUTPATIENT_CLINIC_OR_DEPARTMENT_OTHER): Payer: Self-pay

## 2024-04-24 ENCOUNTER — Other Ambulatory Visit: Payer: Self-pay | Admitting: Family

## 2024-04-25 ENCOUNTER — Other Ambulatory Visit (HOSPITAL_BASED_OUTPATIENT_CLINIC_OR_DEPARTMENT_OTHER): Payer: Self-pay

## 2024-04-25 ENCOUNTER — Other Ambulatory Visit: Payer: Self-pay

## 2024-04-25 MED ORDER — ESOMEPRAZOLE MAGNESIUM 40 MG PO CPDR
40.0000 mg | DELAYED_RELEASE_CAPSULE | Freq: Every day | ORAL | 0 refills | Status: DC
  Filled 2024-04-25: qty 90, 90d supply, fill #0

## 2024-04-25 MED ORDER — RIZATRIPTAN BENZOATE 10 MG PO TBDP
10.0000 mg | ORAL_TABLET | ORAL | 1 refills | Status: DC | PRN
  Filled 2024-04-25: qty 10, 30d supply, fill #0

## 2024-04-28 ENCOUNTER — Other Ambulatory Visit (HOSPITAL_BASED_OUTPATIENT_CLINIC_OR_DEPARTMENT_OTHER): Payer: Self-pay

## 2024-04-28 ENCOUNTER — Encounter: Payer: Self-pay | Admitting: Family

## 2024-05-05 ENCOUNTER — Ambulatory Visit
Admission: RE | Admit: 2024-05-05 | Discharge: 2024-05-05 | Disposition: A | Discharge status: Home / Self Care | Payer: Self-pay | Source: Ambulatory Visit | Attending: Family Medicine | Admitting: Family Medicine

## 2024-05-05 ENCOUNTER — Telehealth: Payer: Self-pay

## 2024-05-05 ENCOUNTER — Other Ambulatory Visit: Payer: Self-pay

## 2024-05-05 ENCOUNTER — Encounter: Payer: Self-pay | Admitting: Family

## 2024-05-05 VITALS — BP 110/78 | HR 82 | Temp 97.4°F | Resp 17

## 2024-05-05 DIAGNOSIS — N3000 Acute cystitis without hematuria: Secondary | ICD-10-CM

## 2024-05-05 DIAGNOSIS — Z113 Encounter for screening for infections with a predominantly sexual mode of transmission: Secondary | ICD-10-CM | POA: Diagnosis not present

## 2024-05-05 LAB — POCT URINE DIPSTICK
Bilirubin, UA: NEGATIVE
Blood, UA: NEGATIVE
Glucose, UA: NEGATIVE mg/dL
Ketones, POC UA: NEGATIVE mg/dL
Nitrite, UA: POSITIVE — AB
POC PROTEIN,UA: NEGATIVE
Spec Grav, UA: 1.015 (ref 1.010–1.025)
Urobilinogen, UA: 0.2 U/dL
pH, UA: 6 (ref 5.0–8.0)

## 2024-05-05 LAB — POCT URINE PREGNANCY: Preg Test, Ur: NEGATIVE

## 2024-05-05 MED ORDER — FLUCONAZOLE 150 MG PO TABS
150.0000 mg | ORAL_TABLET | Freq: Every day | ORAL | 0 refills | Status: DC
  Filled 2024-05-05: qty 2, 2d supply, fill #0

## 2024-05-05 MED ORDER — CEPHALEXIN 500 MG PO CAPS: 500.0000 mg | ORAL_CAPSULE | Freq: Two times a day (BID) | ORAL | 0 refills | Status: DC

## 2024-05-05 MED ORDER — FLUCONAZOLE 150 MG PO TABS: 150.0000 mg | ORAL_TABLET | Freq: Every day | ORAL | 0 refills | Status: AC

## 2024-05-05 NOTE — Discharge Instructions (Addendum)
 The clinical contact you with results of urine culture and STD testing done today if positive.  Start Keflex  twice daily for 7 days.  Lots of rest and fluids.  Please follow-up with your PCP if your symptoms do not improve.  Please go to the ER if you develop any worsening symptoms.  Hope you feel better soon!

## 2024-05-05 NOTE — ED Provider Notes (Signed)
 [facility]    CSN: [id number] Arrival date & time: 05/05/24  0848      History   Chief Complaint Chief Complaint  Patient presents with   Dysuria    HPI Maria Hernandez is a 48 y.o. female presents for dysuria.  Patient reports 1 day of urinary burning.  Denies urinary urgency, frequency, hematuria, fevers, vomiting or flank pain.  No vaginal discharge or STD exposure but would like screening.  She took Azo last night for her symptoms.  No other concerns at this time   Dysuria   Past Medical History:  Diagnosis Date   Migraines     Patient Active Problem List   Diagnosis Date Noted   ESR raised 03/25/2024   Facial rash 03/25/2024   Gastroesophageal reflux disease 02/07/2024   Abnormal Papanicolaou smear of cervix with positive human papilloma virus (HPV) test 01/20/2024   Skin lesion 12/21/2023   Screening for colon cancer 08/01/2023   Routine screening for STI (sexually transmitted infection) 08/01/2023   Irregular menstrual bleeding 08/01/2023   Screening examination for STD (sexually transmitted disease) 02/08/2023   Preventative health care 03/30/2022   Knee pain, bilateral 07/16/2019   Chronic right-sided low back pain without sciatica 05/09/2019   Migraines 06/07/2017   Hearing loss 06/07/2017    Past Surgical History:  Procedure Laterality Date   AUGMENTATION MAMMAPLASTY Bilateral    BREAST ENHANCEMENT SURGERY Bilateral 2005   INNER EAR SURGERY Left    Reports 2 prior ear surgeries for prosthesis. 3rd surgery coming up in January 2019   MANDIBLE OSTEOTOMY  1995    OB History     Gravida  0   Para  0   Term  0   Preterm  0   AB  0   Living  0      SAB  0   IAB  0   Ectopic  0   Multiple  0   Live Births  0            Home Medications    Prior to Admission medications   Medication Sig Start Date End Date Taking? Authorizing Provider  cephALEXin  (KEFLEX ) 500 MG capsule Take 1 capsule (500 mg total) by mouth 2  (two) times daily for 7 days. 05/05/24 05/12/24 Yes Naylene Foell, Jodi R, NP  diclofenac  (VOLTAREN ) 75 MG EC tablet Take 1 tablet (75 mg total) by mouth 2 (two) times daily as needed. 11/20/23   O'Sullivan, Melissa, NP  doxycycline  (VIBRA -TABS) 100 MG tablet Take 1 tablet (100 mg total) by mouth 2 (two) times daily. Patient not taking: Reported on 03/08/2024 03/03/24   O'Sullivan, Melissa, NP  esomeprazole  (NEXIUM ) 40 MG capsule Take 1 capsule (40 mg total) by mouth daily. 04/25/24   O'Sullivan, Melissa, NP  levocetirizine (XYZAL ) 5 MG tablet Take 1 tablet (5 mg total) by mouth every evening. Patient not taking: Reported on 03/08/2024 02/08/24   O'Sullivan, Melissa, NP  metroNIDAZOLE  (METROGEL ) 1 % gel Apply topically daily as needed. 03/28/24   O'Sullivan, Melissa, NP  Multiple Vitamin (MULTIVITAMIN) tablet Take 1 tablet by mouth daily. Patient not taking: Reported on 03/08/2024    [provider]  rizatriptan  (MAXALT -MLT) 10 MG disintegrating tablet Take 1 tablet (10 mg total) by mouth as needed for migraine. May repeat in 2 hours if needed. 04/25/24   Daryl Setter, NP    Family History Family History  Problem Relation Age of Onset   Hypothyroidism Mother  died from covid   Hypertension Father    Diabetes type II Father        BKA   Arthritis Brother    Retinal detachment Brother    Breast cancer Maternal Grandmother     Social History Social History   Tobacco Use   Smoking status: Never   Smokeless tobacco: Never  Vaping Use   Vaping status: Never Used  Substance Use Topics   Alcohol use: Yes    Comment: occasional wine 1- 2 per month   Drug use: Never     Allergies   Verapamil, Macrobid  [nitrofurantoin  macrocrystal], and Metronidazole    Review of Systems Review of Systems  Genitourinary:  Positive for dysuria.     Physical Exam Triage Vital Signs ED Triage Vitals  Encounter Vitals Group     BP 05/05/24 0858 110/78     Girls Systolic BP Percentile --       Girls Diastolic BP Percentile --      Boys Systolic BP Percentile --      Boys Diastolic BP Percentile --      Pulse Rate 05/05/24 0858 82     Resp 05/05/24 0858 17     Temp 05/05/24 0858 (!) 97.4 F (36.3 C)     Temp Source 05/05/24 0858 Oral     SpO2 05/05/24 0858 95 %     Weight --      Height --      Head Circumference --      Peak Flow --      Pain Score 05/05/24 0855 7     Pain Loc --      Pain Education --      Exclude from Growth Chart --    No data found.  Updated Vital Signs BP 110/78   Pulse 82   Temp (!) 97.4 F (36.3 C) (Oral)   Resp 17   LMP 04/23/2024   SpO2 95%   Visual Acuity Right Eye Distance:   Left Eye Distance:   Bilateral Distance:    Right Eye Near:   Left Eye Near:    Bilateral Near:     Physical Exam Vitals and nursing note reviewed.  Constitutional:      Appearance: Normal appearance.  HENT:     Head: Normocephalic and atraumatic.  Eyes:     Pupils: Pupils are equal, round, and reactive to light.  Cardiovascular:     Rate and Rhythm: Normal rate.  Pulmonary:     Effort: Pulmonary effort is normal.  Skin:    General: Skin is warm and dry.  Neurological:     General: No focal deficit present.     Mental Status: She is alert and oriented to person, place, and time.  Psychiatric:        Mood and Affect: Mood normal.        Behavior: Behavior normal.      UC Treatments / Results  Labs (all labs ordered are listed, but only abnormal results are displayed) Labs Reviewed  POCT URINE DIPSTICK - Abnormal; Notable for the following components:      Result Value   Color, UA orange (*)    Clarity, UA hazy (*)    Nitrite, UA Positive (*)    Leukocytes, UA Small (1+) (*)    All other components within normal limits  URINE CULTURE  RPR  HIV ANTIBODY (ROUTINE TESTING W REFLEX)  POCT URINE PREGNANCY  CERVICOVAGINAL ANCILLARY ONLY    EKG  Radiology No results found.  Procedures Procedures (including critical care  time)  Medications Ordered in UC Medications - No data to display  Initial Impression / Assessment and Plan / UC Course  I have reviewed the triage vital signs and the nursing notes.  Pertinent labs & imaging results that were available during my care of the patient were reviewed by me and considered in my medical decision making (see chart for details).     UA does show positive for UTI although patient did take Azo last night.  Will treat with Keflex  based on symptoms and send urine culture.  STD testing as ordered and will contact for any positive results.  Encourage rest fluids and PCP follow-up if symptoms do not improve.  ER precautions reviewed. Final Clinical Impressions(s) / UC Diagnoses   Final diagnoses:  Acute cystitis without hematuria  Screening examination for STD (sexually transmitted disease)     Discharge Instructions      The clinical contact you with results of urine culture and STD testing done today if positive.  Start Keflex  twice daily for 7 days.  Lots of rest and fluids.  Please follow-up with your PCP if your symptoms do not improve.  Please go to the ER if you develop any worsening symptoms.  Hope you feel better soon!    ED Prescriptions     Medication Sig Dispense Auth. Provider   cephALEXin  (KEFLEX ) 500 MG capsule Take 1 capsule (500 mg total) by mouth 2 (two) times daily for 7 days. 14 capsule Teighlor Korson, Jodi R, NP      PDMP not reviewed this encounter.   Loreda Myla SAUNDERS, NP 05/05/24 [phone]

## 2024-05-05 NOTE — ED Triage Notes (Addendum)
 Pt c/o dysuria, bladder pain started yesterday. Pt started taking AZO last night

## 2024-05-05 NOTE — Telephone Encounter (Signed)
 Patient seen earlier today for UTI prescribed antibiotics and requesting Diflucan  for history of antibiotic induced yeast infection.  Diflucan  sent to pharmacy.  Nursing staff to inform patient.

## 2024-05-06 ENCOUNTER — Other Ambulatory Visit (HOSPITAL_BASED_OUTPATIENT_CLINIC_OR_DEPARTMENT_OTHER): Payer: Self-pay

## 2024-05-06 LAB — HIV ANTIBODY (ROUTINE TESTING W REFLEX): HIV Screen 4th Generation wRfx: NONREACTIVE

## 2024-05-06 LAB — RPR: RPR Ser Ql: NONREACTIVE

## 2024-05-07 ENCOUNTER — Other Ambulatory Visit (HOSPITAL_BASED_OUTPATIENT_CLINIC_OR_DEPARTMENT_OTHER): Payer: Self-pay

## 2024-05-07 ENCOUNTER — Ambulatory Visit (HOSPITAL_COMMUNITY): Payer: Self-pay

## 2024-05-07 ENCOUNTER — Encounter (HOSPITAL_BASED_OUTPATIENT_CLINIC_OR_DEPARTMENT_OTHER): Payer: Self-pay

## 2024-05-07 ENCOUNTER — Ambulatory Visit (HOSPITAL_BASED_OUTPATIENT_CLINIC_OR_DEPARTMENT_OTHER)
Admission: RE | Admit: 2024-05-07 | Discharge: 2024-05-07 | Disposition: A | Discharge status: Home / Self Care | Source: Ambulatory Visit | Attending: Family | Admitting: Family

## 2024-05-07 ENCOUNTER — Other Ambulatory Visit: Payer: Self-pay | Admitting: Family

## 2024-05-07 DIAGNOSIS — Z1231 Encounter for screening mammogram for malignant neoplasm of breast: Secondary | ICD-10-CM | POA: Insufficient documentation

## 2024-05-07 DIAGNOSIS — K219 Gastro-esophageal reflux disease without esophagitis: Secondary | ICD-10-CM

## 2024-05-07 DIAGNOSIS — R700 Elevated erythrocyte sedimentation rate: Secondary | ICD-10-CM

## 2024-05-07 DIAGNOSIS — G43829 Menstrual migraine, not intractable, without status migrainosus: Secondary | ICD-10-CM

## 2024-05-07 DIAGNOSIS — Z1211 Encounter for screening for malignant neoplasm of colon: Secondary | ICD-10-CM

## 2024-05-07 DIAGNOSIS — R21 Rash and other nonspecific skin eruption: Secondary | ICD-10-CM

## 2024-05-07 LAB — URINE CULTURE: Culture: 20000 — AB

## 2024-05-07 LAB — CERVICOVAGINAL ANCILLARY ONLY
Chlamydia: NEGATIVE
Comment: NEGATIVE
Comment: NEGATIVE
Comment: NORMAL
Neisseria Gonorrhea: NEGATIVE
Trichomonas: NEGATIVE

## 2024-05-07 MED ORDER — SULFAMETHOXAZOLE-TRIMETHOPRIM 800-160 MG PO TABS
1.0000 | ORAL_TABLET | Freq: Two times a day (BID) | ORAL | 0 refills | Status: AC
  Filled 2024-05-07: qty 6, 3d supply, fill #0

## 2024-05-09 ENCOUNTER — Ambulatory Visit: Payer: Self-pay | Admitting: Family

## 2024-05-13 ENCOUNTER — Encounter: Payer: Self-pay | Admitting: Family

## 2024-05-14 ENCOUNTER — Ambulatory Visit

## 2024-05-17 ENCOUNTER — Ambulatory Visit

## 2024-05-21 ENCOUNTER — Ambulatory Visit
Admission: RE | Admit: 2024-05-21 | Discharge: 2024-05-21 | Disposition: A | Discharge status: Home / Self Care | Source: Ambulatory Visit | Attending: Urgent Care | Admitting: Urgent Care

## 2024-05-21 VITALS — BP 121/80 | HR 63 | Temp 98.4°F | Resp 16

## 2024-05-21 DIAGNOSIS — N760 Acute vaginitis: Secondary | ICD-10-CM | POA: Diagnosis not present

## 2024-05-21 DIAGNOSIS — N898 Other specified noninflammatory disorders of vagina: Secondary | ICD-10-CM | POA: Insufficient documentation

## 2024-05-21 LAB — POCT URINE DIPSTICK
Bilirubin, UA: NEGATIVE
Glucose, UA: NEGATIVE mg/dL
Ketones, POC UA: NEGATIVE mg/dL
Leukocytes, UA: NEGATIVE
Nitrite, UA: NEGATIVE
POC PROTEIN,UA: NEGATIVE
Spec Grav, UA: 1.015 (ref 1.010–1.025)
Urobilinogen, UA: 0.2 U/dL
pH, UA: 7.5 (ref 5.0–8.0)

## 2024-05-21 MED ORDER — CLOTRIMAZOLE 1 % VA CREA: 1.0000 | TOPICAL_CREAM | Freq: Every day | VAGINAL | 0 refills | Status: DC

## 2024-05-21 MED ORDER — FLUCONAZOLE 150 MG PO TABS: 150.0000 mg | ORAL_TABLET | ORAL | 0 refills | Status: AC

## 2024-05-21 NOTE — Telephone Encounter (Signed)
Pt at [facility].  

## 2024-05-21 NOTE — ED Triage Notes (Signed)
 Pt wants to check for UTI and BV. Pt reports burning in labia, vaginal discomfort x 3-4 days. Monistat is not working.

## 2024-05-21 NOTE — ED Provider Notes (Signed)
 [facility]  Note:  This document was prepared using Conservation officer, historic buildings and may include unintentional dictation errors.  MRN: [id number] DOB: 28-Jan-1976  Subjective:   Maria Hernandez is a 48 y.o. female presenting for recurrent persistent vaginal irritation, vaginal burning of the labia for the past 3 to 4 days.  Patient has had difficulty in the past with yeast infections associated with antibiotic use.  She just had an UTI at the beginning of the month and was treated for this.  She ended up changing the antibiotic due to an intolerance.  She did 2 doses of Diflucan  and has done Monistat.  Unfortunately her vaginal symptoms persist.  Denies fever, n/v, abdominal pain, pelvic pain, rashes, dysuria, urinary frequency, hematuria, vaginal discharge.  No concern for STI, just tested negative.  No current facility-administered medications for this encounter.  Current Outpatient Medications:    Tioconazole (MONISTAT 1 VA), Place vaginally., Disp: , Rfl:    diclofenac  (VOLTAREN ) 75 MG EC tablet, Take 1 tablet (75 mg total) by mouth 2 (two) times daily as needed., Disp: 30 tablet, Rfl: 0   esomeprazole  (NEXIUM ) 40 MG capsule, Take 1 capsule (40 mg total) by mouth daily., Disp: 90 capsule, Rfl: 0   levocetirizine (XYZAL ) 5 MG tablet, Take 1 tablet (5 mg total) by mouth every evening. (Patient not taking: No sig reported), Disp: 90 tablet, Rfl: 1   metroNIDAZOLE  (METROGEL ) 1 % gel, Apply topically daily as needed., Disp: 60 g, Rfl: 0   Multiple Vitamin (MULTIVITAMIN) tablet, Take 1 tablet by mouth daily. (Patient not taking: No sig reported), Disp: , Rfl:    rizatriptan  (MAXALT -MLT) 10 MG disintegrating tablet, Take 1 tablet (10 mg total) by mouth as needed for migraine. May repeat in 2 hours if needed., Disp: 10 tablet, Rfl: 1   Allergies  Allergen Reactions   Verapamil Hives   Cephalexin  Other (See Comments)    Makes face hot    Macrobid  [Nitrofurantoin   Macrocrystal]     Light headed   Metronidazole  Other (See Comments)    Lightheadedness    Past Medical History:  Diagnosis Date   Migraines      Past Surgical History:  Procedure Laterality Date   AUGMENTATION MAMMAPLASTY Bilateral    BREAST ENHANCEMENT SURGERY Bilateral 2005   INNER EAR SURGERY Left    Reports 2 prior ear surgeries for prosthesis. 3rd surgery coming up in January 2019   MANDIBLE OSTEOTOMY  1995    Family History  Problem Relation Age of Onset   Hypothyroidism Mother        died from covid   Hypertension Father    Diabetes type II Father        BKA   Arthritis Brother    Retinal detachment Brother    Breast cancer Maternal Grandmother     Social History   Tobacco Use   Smoking status: Never   Smokeless tobacco: Never  Vaping Use   Vaping status: Never Used  Substance Use Topics   Alcohol use: Yes    Comment: occasional wine 1- 2 per month   Drug use: Never    ROS   Objective:   Vitals: BP 121/80 (BP Location: Right Arm)   Pulse 63   Temp 98.4 F (36.9 C) (Oral)   Resp 16   LMP 04/21/2024 (Exact Date)   SpO2 96%   Physical Exam Constitutional:      General: She is not in acute distress.  Appearance: Normal appearance. She is well-developed. She is not ill-appearing, toxic-appearing or diaphoretic.  HENT:     Head: Normocephalic and atraumatic.     Right Ear: External ear normal.     Left Ear: External ear normal.     Nose: Nose normal.     Mouth/Throat:     Mouth: Mucous membranes are moist.  Eyes:     General: No scleral icterus.       Right eye: No discharge.        Left eye: No discharge.     Extraocular Movements: Extraocular movements intact.     Conjunctiva/sclera: Conjunctivae normal.  Cardiovascular:     Rate and Rhythm: Normal rate.  Pulmonary:     Effort: Pulmonary effort is normal.  Abdominal:     General: Bowel sounds are normal. There is no distension.     Palpations: Abdomen is soft. There is no mass.      Tenderness: There is no abdominal tenderness. There is no right CVA tenderness, left CVA tenderness, guarding or rebound.  Skin:    General: Skin is warm and dry.  Neurological:     General: No focal deficit present.     Mental Status: She is alert and oriented to person, place, and time.  Psychiatric:        Mood and Affect: Mood normal.        Behavior: Behavior normal.        Thought Content: Thought content normal.        Judgment: Judgment normal.     Results for orders placed or performed during the hospital encounter of 05/21/24 (from the past 24 hours)  POCT URINE DIPSTICK     Status: Abnormal   Collection Time: 05/21/24  9:49 AM  Result Value Ref Range   Color, UA yellow yellow   Clarity, UA hazy (A) clear   Glucose, UA negative negative mg/dL   Bilirubin, UA negative negative   Ketones, POC UA negative negative mg/dL   Spec Grav, UA 8.984 8.989 - 1.025   Blood, UA moderate (A) negative   pH, UA 7.5 5.0 - 8.0   POC PROTEIN,UA negative negative, trace   Urobilinogen, UA 0.2 0.2 or 1.0 E.U./dL   Nitrite, UA Negative Negative   Leukocytes, UA Negative Negative    Assessment and Plan :   PDMP not reviewed this encounter.  1. Acute vaginitis   2. Vaginal irritation    Patient requested concurrent oral and topical antifungal.  Recommended an extended course of fluconazole  and will put this together with clotrimazole topically.  Urine culture and vaginal cytology pending.  Counseled patient on potential for adverse effects with medications prescribed/recommended today, ER and return-to-clinic precautions discussed, patient verbalized understanding.    Christopher Savannah, PA-C 05/21/24 1011

## 2024-05-21 NOTE — Discharge Instructions (Signed)
 Will let you know by her urine culture in 2 to 3 days.  Your vaginal testing results to be available tomorrow.  For now you can start clotrimazole vaginal cream together with oral Diflucan  for suspected persisting yeast infection.

## 2024-05-22 LAB — CERVICOVAGINAL ANCILLARY ONLY
Bacterial Vaginitis (gardnerella): NEGATIVE
Candida Glabrata: NEGATIVE
Candida Vaginitis: NEGATIVE
Comment: NEGATIVE
Comment: NEGATIVE
Comment: NEGATIVE

## 2024-05-22 LAB — URINE CULTURE: Culture: NO GROWTH

## 2024-05-23 ENCOUNTER — Ambulatory Visit (HOSPITAL_COMMUNITY): Payer: Self-pay

## 2024-05-29 ENCOUNTER — Encounter: Payer: Self-pay | Admitting: Dermatology

## 2024-05-29 ENCOUNTER — Ambulatory Visit: Admitting: Dermatology

## 2024-05-29 VITALS — BP 137/87 | HR 78

## 2024-05-29 DIAGNOSIS — L719 Rosacea, unspecified: Secondary | ICD-10-CM | POA: Diagnosis not present

## 2024-05-29 DIAGNOSIS — L710 Perioral dermatitis: Secondary | ICD-10-CM | POA: Insufficient documentation

## 2024-05-29 MED ORDER — DOXYCYCLINE HYCLATE 100 MG PO TABS: 100.0000 mg | ORAL_TABLET | Freq: Two times a day (BID) | ORAL | 6 refills | Status: AC

## 2024-05-29 NOTE — Patient Instructions (Addendum)
 VISIT SUMMARY:  Today, we discussed your recent severe flare-up of rosacea, which began in August. We identified potential triggers, including stress, alcohol, and hormonal changes due to menopause. You are currently using Neutrogena products and metronidazole  gel for your condition. 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  YOUR PLAN:  -ROSACEA:  Rosacea is a chronic skin condition that causes redness and visible blood vessels in your face. It can be triggered by stress, alcohol, and hormonal changes.   For your flare-ups, you have been prescribed doxycycline  100 mg once daily for two weeks, to be taken with food.   Continue using metronidazole  gel daily on your entire face. For moisturizing, use the Avene Tolerance line, specifically the morning and nighttime moisturizers. For makeup, try [employer] and find the correct match for your skin.  INSTRUCTIONS:  You are scheduled for a full head-to-toe skin cancer screening in the new year. Please follow up as needed if your symptoms do not improve or if you have any concerns.     Important Information   Due to recent changes in healthcare laws, you may see results of your pathology and/or laboratory studies on MyChart before the doctors have had a chance to review them. We understand that in some cases there may be results that are confusing or concerning to you. Please understand that not all results are received at the same time and often the doctors may need to interpret multiple results in order to provide you with the best plan of care or course of treatment. Therefore, we ask that you please give us  2 business days to thoroughly review all your results before contacting the office for clarification. Should we see a critical lab result, you will be contacted sooner.     If You Need Anything After Your Visit   If you have any questions or concerns for your doctor, please call  our main line at [phone]. If no one answers, please leave a voicemail as directed and we will return your call as soon as possible. Messages left after 4 pm will be answered the following business day.    You may also send us  a message via MyChart. We typically respond to MyChart messages within 1-2 business days.  For prescription refills, please ask your pharmacy to contact our office. Our fax number is [phone].  If you have an urgent issue when the clinic is closed that cannot wait until the next business day, you can page your doctor at the number below.     Please note that while we do our best to be available for urgent issues outside of office hours, we are not available 24/7.    If you have an urgent issue and are unable to reach us , you may choose to seek medical care at your doctor's office, retail clinic, urgent care center, or emergency room.   If you have a medical emergency, please immediately call 911 or go to the emergency department. In the event of inclement weather, please call our main line at [phone] for an update on the status of any delays or closures.  Dermatology Medication Tips: Please keep the boxes that topical medications come in in order to help keep track of the instructions about where and how to use these. Pharmacies typically print the medication instructions only on the boxes and not directly on the medication tubes.   If your medication is too expensive, please contact our  office at [phone] or send us  a message through MyChart.    We are unable to tell what your co-pay for medications will be in advance as this is different depending on your insurance coverage. However, we may be able to find a substitute medication at lower cost or fill out paperwork to get insurance to cover a needed medication.    If a prior authorization is required to get your medication covered by your insurance company, please allow us  1-2 business days to complete  this process.   Drug prices often vary depending on where the prescription is filled and some pharmacies may offer cheaper prices.   The website www.goodrx.com contains coupons for medications through different pharmacies. The prices here do not account for what the cost may be with help from insurance (it may be cheaper with your insurance), but the website can give you the price if you did not use any insurance.  - You can print the associated coupon and take it with your prescription to the pharmacy.  - You may also stop by our office during regular business hours and pick up a GoodRx coupon card.  - If you need your prescription sent electronically to a different pharmacy, notify our office through [facility] or by phone at [phone]

## 2024-05-29 NOTE — Progress Notes (Signed)
   New Patient Visit   Subjective  Maria Hernandez is a 48 y.o. female who presents for the following: Rash   Patient states she has rash located at the face that she would like to have examined. Patient reports the rash was there been there for 3 months ago. She reports the areas was bothersome.She reports the area was burning and painful. Patient reports she has not previously been treated for these areas by pcp. She was prescribed a course of oral Doxycycline  100 mg 2 times daily for 1 month. She reports the areas will come and go. She is clear today. She feels any time she wears make-up of put anything on her face she will get contact dermatitis. She is current washing with Neutrogena Ultra sensitive wash and moisturizer.  Patient provided verbal consent for the use of an AI-assisted program to generate a detailed after-visit summary. The patient understands that the AI tool is used to support clinical documentation and that all information will be reviewed and verified by the healthcare provider.   The following portions of the chart were reviewed this encounter and updated as appropriate: medications, allergies, medical history  Review of Systems:  No other skin or systemic complaints except as noted in HPI or Assessment and Plan.  Objective  Well appearing patient in no apparent distress; mood and affect are within normal limits.  A focused examination was performed of the following areas: Face  Relevant exam findings are noted in the Assessment and Plan.      Assessment & Plan   Rosacea Recurrent facial flare-ups, likely triggered by stress, alcohol, and possibly hormonal changes. Previous doxycycline  treatment was effective. Current regimen includes Neutrogena ultra-sensitive products. No prescription topical treatments currently used.  - Prescribed doxycycline  100 mg once daily for two weeks during flare-ups, to be taken with food. Discussed potential side effect of upset  stomach. - Continue metronidazole  gel daily, applied to the entire face. - Recommended Avene Tolerance line for moisturizing, specifically the morning and nighttime moisturizers. - Suggested D[employer] for makeup, emphasizing finding the correct match. - Scheduled full head-to-toe skin cancer screening for the new year.   Doxycycline  should be taken with food to prevent nausea. Do not lay down for 30 minutes after taking. Be cautious with sun exposure and use good sun protection while on this medication. Pregnant women should not take this medication.      No follow-ups on file.  I, Jetta Ager, am acting as neurosurgeon for [employer], DO.  Documentation: I have reviewed the above documentation for accuracy and completeness, and I agree with the above.  Delon Lenis, DO

## 2024-06-08 ENCOUNTER — Ambulatory Visit: Admitting: Family

## 2024-06-26 ENCOUNTER — Other Ambulatory Visit: Payer: Self-pay

## 2024-06-26 MED ORDER — RIZATRIPTAN BENZOATE 10 MG PO TBDP: 10.0000 mg | ORAL_TABLET | ORAL | 3 refills | Status: AC | PRN

## 2024-07-03 ENCOUNTER — Other Ambulatory Visit: Payer: Self-pay | Admitting: Family

## 2024-07-03 ENCOUNTER — Other Ambulatory Visit (HOSPITAL_COMMUNITY): Payer: Self-pay

## 2024-07-03 MED ORDER — ESOMEPRAZOLE MAGNESIUM 40 MG PO CPDR
40.0000 mg | DELAYED_RELEASE_CAPSULE | Freq: Every day | ORAL | 1 refills | Status: AC
  Filled 2024-07-03 – 2024-07-18 (×2): qty 90, 90d supply, fill #0

## 2024-07-18 ENCOUNTER — Other Ambulatory Visit: Payer: Self-pay | Admitting: Family

## 2024-07-18 ENCOUNTER — Other Ambulatory Visit (HOSPITAL_BASED_OUTPATIENT_CLINIC_OR_DEPARTMENT_OTHER): Payer: Self-pay

## 2024-07-18 ENCOUNTER — Other Ambulatory Visit (HOSPITAL_COMMUNITY): Payer: Self-pay

## 2024-07-19 ENCOUNTER — Other Ambulatory Visit (HOSPITAL_COMMUNITY): Payer: Self-pay

## 2024-07-19 ENCOUNTER — Other Ambulatory Visit: Payer: Self-pay

## 2024-07-19 MED ORDER — LEVOCETIRIZINE DIHYDROCHLORIDE 5 MG PO TABS
5.0000 mg | ORAL_TABLET | Freq: Every evening | ORAL | 1 refills | Status: AC
  Filled 2024-07-19 – 2024-07-31 (×2): qty 90, 90d supply, fill #0

## 2024-07-20 ENCOUNTER — Other Ambulatory Visit: Payer: Self-pay

## 2024-07-25 ENCOUNTER — Other Ambulatory Visit: Payer: Self-pay

## 2024-07-31 ENCOUNTER — Other Ambulatory Visit (HOSPITAL_COMMUNITY): Payer: Self-pay

## 2024-07-31 ENCOUNTER — Other Ambulatory Visit: Payer: Self-pay

## 2024-08-05 ENCOUNTER — Encounter: Payer: Self-pay | Admitting: Family

## 2024-08-05 DIAGNOSIS — Z8742 Personal history of other diseases of the female genital tract: Secondary | ICD-10-CM

## 2024-08-05 DIAGNOSIS — Z8669 Personal history of other diseases of the nervous system and sense organs: Secondary | ICD-10-CM

## 2024-08-06 MED ORDER — TERCONAZOLE 0.8 % VA CREA: 1.0000 | TOPICAL_CREAM | Freq: Every day | VAGINAL | 0 refills | Status: AC

## 2024-08-06 MED ORDER — OFLOXACIN 0.3 % OP SOLN: 1.0000 [drp] | OPHTHALMIC | 0 refills | Status: AC

## 2024-08-06 NOTE — Telephone Encounter (Signed)

## 2024-08-07 ENCOUNTER — Other Ambulatory Visit: Payer: Self-pay

## 2024-09-19 ENCOUNTER — Ambulatory Visit: Admitting: Dermatology

## 2024-12-05 ENCOUNTER — Ambulatory Visit: Admitting: Dermatology
